# Patient Record
Sex: Male | Born: 1960 | Race: White | Hispanic: No | Marital: Married | Smoking: Former smoker
Health system: Southern US, Community
[De-identification: ages and names within clinical notes are randomized; demographics above are authoritative.]

## PROBLEM LIST (undated history)

## (undated) DIAGNOSIS — M1990 Unspecified osteoarthritis, unspecified site: Secondary | ICD-10-CM

## (undated) DIAGNOSIS — Z87442 Personal history of urinary calculi: Secondary | ICD-10-CM

## (undated) DIAGNOSIS — F419 Anxiety disorder, unspecified: Secondary | ICD-10-CM

## (undated) DIAGNOSIS — J189 Pneumonia, unspecified organism: Secondary | ICD-10-CM

## (undated) DIAGNOSIS — M069 Rheumatoid arthritis, unspecified: Secondary | ICD-10-CM

## (undated) DIAGNOSIS — K219 Gastro-esophageal reflux disease without esophagitis: Secondary | ICD-10-CM

## (undated) HISTORY — PX: JOINT REPLACEMENT: SHX530

## (undated) HISTORY — PX: LEG SURGERY: SHX1003

## (undated) HISTORY — PX: ORIF FINGER / THUMB FRACTURE: SUR932

## (undated) HISTORY — PX: FINGER SURGERY: SHX640

---

## 2007-03-02 HISTORY — PX: KNEE ARTHROSCOPY W/ ACL RECONSTRUCTION: SHX1858

## 2011-12-09 DIAGNOSIS — N400 Enlarged prostate without lower urinary tract symptoms: Secondary | ICD-10-CM | POA: Insufficient documentation

## 2011-12-09 DIAGNOSIS — K219 Gastro-esophageal reflux disease without esophagitis: Secondary | ICD-10-CM | POA: Insufficient documentation

## 2012-03-10 DIAGNOSIS — G4700 Insomnia, unspecified: Secondary | ICD-10-CM | POA: Insufficient documentation

## 2013-03-01 HISTORY — PX: COLONOSCOPY: SHX174

## 2016-12-23 ENCOUNTER — Ambulatory Visit (INDEPENDENT_AMBULATORY_CARE_PROVIDER_SITE_OTHER): Payer: Self-pay | Admitting: Orthopaedic Surgery

## 2016-12-30 ENCOUNTER — Ambulatory Visit (INDEPENDENT_AMBULATORY_CARE_PROVIDER_SITE_OTHER): Payer: 59

## 2016-12-30 ENCOUNTER — Ambulatory Visit (INDEPENDENT_AMBULATORY_CARE_PROVIDER_SITE_OTHER): Payer: 59 | Admitting: Orthopaedic Surgery

## 2016-12-30 DIAGNOSIS — M1711 Unilateral primary osteoarthritis, right knee: Secondary | ICD-10-CM

## 2016-12-30 DIAGNOSIS — M19011 Primary osteoarthritis, right shoulder: Secondary | ICD-10-CM | POA: Diagnosis not present

## 2016-12-30 NOTE — Progress Notes (Signed)
Office Visit Note   Patient: Taylor Heath           Date of Birth: [birth year]           MRN: [id number] Visit Date: 12/30/2016              Requested by: No referring provider defined for this encounter. PCP: [facility]   Assessment & Plan: Visit Diagnoses:  1. Primary osteoarthritis of right shoulder   2. Primary osteoarthritis of right knee     Plan: Patient has advanced right knee degenerative joint disease and right shoulder degenerative joint disease.  Clinically speaking he is doing fine and compensating fine with his right shoulder.  However his right knee is significantly affecting his ADLs and quality of life and he wishes to pursue a total knee replacement.  He understands risk benefits alternatives of surgery including infection, stiffness, incomplete relief of pain, DVTs.  I showed him on a model what the surgery entails.  He had no complications after his left total hip replacement which was done several years ago.  We will get him scheduled for surgery in the near future.    Follow-Up Instructions: Return if symptoms worsen or fail to improve.   Orders:  Orders Placed This Encounter  Procedures  . XR KNEE 3 VIEW RIGHT   No orders of the defined types were placed in this encounter.     Procedures: No procedures performed   Clinical Data: No additional findings.   Subjective: No chief complaint on file.   Patient is a very pleasant 56 year old gentleman who comes to my office for a new problem of right knee pain and right shoulder pain.  He does have a diagnosis of right shoulder osteoarthritis and right knee osteoarthritis.  He has had several cortisone injections in both joints with temporary relief.  His right knee injections have stopped giving him any significant relief.  His right shoulder has had good relief from injections so far.  He is status post right ACL reconstruction sometime ago.  His right shoulder mainly gives him  problems with reaching out and reaching up but otherwise he is doing well.  He is having significant difficulty with ADLs and deterioration of quality of life related to his right knee.  He has significant pain with prolonged standing and activity and especially night pain.  Denies any numbness and tingling.    Review of Systems  Constitutional: Negative.   All other systems reviewed and are negative.    Objective: Vital Signs: There were no vitals taken for this visit.  Physical Exam  Constitutional: He is oriented to person, place, and time. He appears well-developed and well-nourished.  HENT:  Head: Normocephalic and atraumatic.  Eyes: Pupils are equal, round, and reactive to light.  Neck: Neck supple.  Pulmonary/Chest: Effort normal.  Abdominal: Soft.  Musculoskeletal: Normal range of motion.  Neurological: He is alert and oriented to person, place, and time.  Skin: Skin is warm.  Psychiatric: He has a normal mood and affect. His behavior is normal. Judgment and thought content normal.  Nursing note and vitals reviewed.   Ortho Exam Right shoulder exam shows intact rotator cuff.  He does have mild limitation with external rotation and forward flexion.  Right knee exam shows no joint effusion.  Range of motion is 10 degrees to approximately 100 degrees.  Collaterals and cruciates are grossly intact.  Patella tracking is normal.  Positive patellar crepitus. Specialty Comments:  No specialty  comments available.  Imaging: Xr Knee 3 View Right  Result Date: 12/30/2016 End-stage degenerative joint disease right knee    PMFS History: Patient Active Problem List   Diagnosis Date Noted  . Primary osteoarthritis of right shoulder 12/30/2016  . Primary osteoarthritis of right knee 12/30/2016   No past medical history on file.  No family history on file.  No past surgical history on file. Social History   Occupational History  . Not on file.   Social History Main  Topics  . Smoking status: Not on file  . Smokeless tobacco: Not on file  . Alcohol use Not on file  . Drug use: Unknown  . Sexual activity: Not on file

## 2017-01-18 NOTE — Pre-Procedure Instructions (Signed)
Taylor Heath  01/18/2017      [id number] [place], [name] - [address] [place] [address] Phone: [phone] Fax: [phone]    Your procedure is scheduled on November 29  Report to [facility] Admitting at 1015 A.M.  Call this number if you have problems the morning of surgery:  [phone]   Remember:  Do not eat food or drink liquids after midnight.  Continue all medications as directed by your physician except follow these medication instructions before surgery below   Take these medicines the morning of surgery with A SIP OF WATER  omeprazole (PRILOSEC OTC)  7 days prior to surgery STOP taking any Aspirin(unless otherwise instructed by your surgeon), Aleve, Naproxen, Ibuprofen, Motrin, Advil, Goody's, BC's, all herbal medications, fish oil, and all vitamins    Do not wear jewelry  Do not wear lotions, powders, or cologne, or deoderant.  Do not shave 48 hours prior to surgery.  Men may shave face and neck.  Do not bring valuables to the hospital.  [facility] is not responsible for any belongings or valuables.  Contacts, dentures or bridgework may not be worn into surgery.  Leave your suitcase in the car.  After surgery it may be brought to your room.  For patients admitted to the hospital, discharge time will be determined by your treatment team.  Patients discharged the day of surgery will not be allowed to drive home.    Special instructions:   Taylor Heath- Preparing For Surgery  Before surgery, you can play an important role. Because skin is not sterile, your skin needs to be as free of germs as possible. You can reduce the number of germs on your skin by washing with CHG (chlorahexidine gluconate) Soap before surgery.  CHG is an antiseptic cleaner which kills germs and bonds with the skin to continue killing germs even after washing.  Please do not use if you have an allergy to CHG or antibacterial  soaps. If your skin becomes reddened/irritated stop using the CHG.  Do not shave (including legs and underarms) for at least 48 hours prior to first CHG shower. It is OK to shave your face.  Please follow these instructions carefully.   1. Shower the NIGHT BEFORE SURGERY and the MORNING OF SURGERY with CHG.   2. If you chose to wash your hair, wash your hair first as usual with your normal shampoo.  3. After you shampoo, rinse your hair and body thoroughly to remove the shampoo.  4. Use CHG as you would any other liquid soap. You can apply CHG directly to the skin and wash gently with a scrungie or a clean washcloth.   5. Apply the CHG Soap to your body ONLY FROM THE NECK Heath.  Do not use on open wounds or open sores. Avoid contact with your eyes, ears, mouth and genitals (private parts). Wash Face and genitals (private parts)  with your normal soap.  6. Wash thoroughly, paying special attention to the area where your surgery will be performed.  7. Thoroughly rinse your body with warm water from the neck Heath.  8. DO NOT shower/wash with your normal soap after using and rinsing off the CHG Soap.  9. Pat yourself dry with a CLEAN TOWEL.  10. Wear CLEAN PAJAMAS to bed the night before surgery, wear comfortable clothes the morning of surgery  11. Place CLEAN SHEETS on your bed the night of your first shower and  DO NOT SLEEP WITH PETS.    Day of Surgery: Do not apply any deodorants/lotions. Please wear clean clothes to the hospital/surgery center.      Please read over the following fact sheets that you were given.

## 2017-01-19 ENCOUNTER — Other Ambulatory Visit (INDEPENDENT_AMBULATORY_CARE_PROVIDER_SITE_OTHER): Payer: Self-pay | Admitting: Orthopaedic Surgery

## 2017-01-19 ENCOUNTER — Encounter (HOSPITAL_COMMUNITY): Payer: Self-pay

## 2017-01-19 ENCOUNTER — Encounter (HOSPITAL_COMMUNITY)
Admission: RE | Admit: 2017-01-19 | Discharge: 2017-01-19 | Disposition: A | Discharge status: Home / Self Care | Payer: Managed Care, Other (non HMO) | Source: Ambulatory Visit | Attending: Orthopaedic Surgery | Admitting: Orthopaedic Surgery

## 2017-01-19 ENCOUNTER — Other Ambulatory Visit: Payer: Self-pay

## 2017-01-19 DIAGNOSIS — Z01810 Encounter for preprocedural cardiovascular examination: Secondary | ICD-10-CM | POA: Diagnosis present

## 2017-01-19 DIAGNOSIS — M1711 Unilateral primary osteoarthritis, right knee: Secondary | ICD-10-CM

## 2017-01-19 DIAGNOSIS — Z01812 Encounter for preprocedural laboratory examination: Secondary | ICD-10-CM | POA: Insufficient documentation

## 2017-01-19 HISTORY — DX: Unspecified osteoarthritis, unspecified site: M19.90

## 2017-01-19 HISTORY — DX: Personal history of urinary calculi: Z87.442

## 2017-01-19 HISTORY — DX: Anxiety disorder, unspecified: F41.9

## 2017-01-19 LAB — CBC WITH DIFFERENTIAL/PLATELET
BASOS ABS: 0 10*3/uL (ref 0.0–0.1)
BASOS PCT: 0 %
EOS ABS: 0.1 10*3/uL (ref 0.0–0.7)
EOS PCT: 2 %
HCT: 46.5 % (ref 39.0–52.0)
Hemoglobin: 16.2 g/dL (ref 13.0–17.0)
LYMPHS PCT: 28 %
Lymphs Abs: 1.8 10*3/uL (ref 0.7–4.0)
MCH: 31.6 pg (ref 26.0–34.0)
MCHC: 34.8 g/dL (ref 30.0–36.0)
MCV: 90.8 fL (ref 78.0–100.0)
MONO ABS: 0.5 10*3/uL (ref 0.1–1.0)
Monocytes Relative: 7 %
Neutro Abs: 4 10*3/uL (ref 1.7–7.7)
Neutrophils Relative %: 63 %
PLATELETS: 245 10*3/uL (ref 150–400)
RBC: 5.12 MIL/uL (ref 4.22–5.81)
RDW: 13.3 % (ref 11.5–15.5)
WBC: 6.4 10*3/uL (ref 4.0–10.5)

## 2017-01-19 LAB — TYPE AND SCREEN
ABO/RH(D): B POS
ANTIBODY SCREEN: NEGATIVE

## 2017-01-19 LAB — COMPREHENSIVE METABOLIC PANEL
ALT: 48 U/L (ref 17–63)
AST: 35 U/L (ref 15–41)
Albumin: 4.5 g/dL (ref 3.5–5.0)
Alkaline Phosphatase: 65 U/L (ref 38–126)
Anion gap: 9 (ref 5–15)
BUN: 13 mg/dL (ref 6–20)
CO2: 24 mmol/L (ref 22–32)
CREATININE: 0.87 mg/dL (ref 0.61–1.24)
Calcium: 9.8 mg/dL (ref 8.9–10.3)
Chloride: 106 mmol/L (ref 101–111)
GFR calc Af Amer: >60 mL/min (ref >60)
Glucose, Bld: 100 mg/dL — ABNORMAL HIGH (ref 65–99)
POTASSIUM: 4.1 mmol/L (ref 3.5–5.1)
SODIUM: 139 mmol/L (ref 135–145)
TOTAL PROTEIN: 7.4 g/dL (ref 6.5–8.1)
Total Bilirubin: 1.3 mg/dL — ABNORMAL HIGH (ref 0.3–1.2)

## 2017-01-19 LAB — SURGICAL PCR SCREEN
MRSA, PCR: POSITIVE — AB
STAPHYLOCOCCUS AUREUS: POSITIVE — AB

## 2017-01-19 LAB — ABO/RH: ABO/RH(D): B POS

## 2017-01-19 LAB — PROTIME-INR
INR: 0.96
PROTHROMBIN TIME: 12.6 s (ref 11.4–15.2)

## 2017-01-19 LAB — APTT: APTT: 32 s (ref 24–36)

## 2017-01-19 LAB — C-REACTIVE PROTEIN

## 2017-01-19 LAB — SEDIMENTATION RATE: SED RATE: 5 mm/h (ref 0–16)

## 2017-01-19 MED ORDER — CHLORHEXIDINE GLUCONATE 4 % EX LIQD: 60.0000 mL | Freq: Once | CUTANEOUS | Status: DC

## 2017-01-19 NOTE — Progress Notes (Signed)
Needs vanc in addition to the ancef to be started in short stay

## 2017-01-19 NOTE — Progress Notes (Signed)
PCP: [facility]  Cardiologist: pt denies   EKG: pt denies past year  Stress test: pt denies ever  ECHO: pt denies ever  Cardiac Cath: pt denies ever  Chest x-ray: pt denies past year

## 2017-01-19 NOTE — Progress Notes (Signed)
Mup[facility]cin [facility] for positive PCR of MRSA and Staph. Pt notified and voiced understanding.

## 2017-01-24 ENCOUNTER — Other Ambulatory Visit (INDEPENDENT_AMBULATORY_CARE_PROVIDER_SITE_OTHER): Payer: Self-pay | Admitting: Orthopaedic Surgery

## 2017-01-24 DIAGNOSIS — M1711 Unilateral primary osteoarthritis, right knee: Secondary | ICD-10-CM

## 2017-01-25 ENCOUNTER — Other Ambulatory Visit (INDEPENDENT_AMBULATORY_CARE_PROVIDER_SITE_OTHER): Payer: Self-pay

## 2017-01-25 MED ORDER — VANCOMYCIN HCL IN DEXTROSE 1-5 GM/200ML-% IV SOLN: 1000.0000 mg | INTRAVENOUS | Status: DC

## 2017-01-26 MED ORDER — CEFAZOLIN SODIUM-DEXTROSE 2-4 GM/100ML-% IV SOLN
2.0000 g | INTRAVENOUS | Status: DC
  Filled 2017-01-26: qty 100

## 2017-01-26 MED ORDER — BUPIVACAINE LIPOSOME 1.3 % IJ SUSP
20.0000 mL | INTRAMUSCULAR | Status: AC
  Administered 2017-01-27: 20 mL
  Filled 2017-01-26: qty 20

## 2017-01-26 MED ORDER — SODIUM CHLORIDE 0.9 % IV SOLN
1000.0000 mg | INTRAVENOUS | Status: AC
  Administered 2017-01-27: 1000 mg via INTRAVENOUS
  Filled 2017-01-26: qty 10

## 2017-01-27 ENCOUNTER — Other Ambulatory Visit: Payer: Self-pay

## 2017-01-27 ENCOUNTER — Inpatient Hospital Stay (HOSPITAL_COMMUNITY): Payer: Managed Care, Other (non HMO) | Admitting: Certified Registered Nurse Anesthetist

## 2017-01-27 ENCOUNTER — Encounter (INDEPENDENT_AMBULATORY_CARE_PROVIDER_SITE_OTHER): Payer: Self-pay

## 2017-01-27 ENCOUNTER — Encounter (HOSPITAL_COMMUNITY)
Admission: RE | Disposition: A | Discharge status: Home Health | Payer: Self-pay | Source: Ambulatory Visit | Attending: Orthopaedic Surgery

## 2017-01-27 ENCOUNTER — Inpatient Hospital Stay (HOSPITAL_COMMUNITY): Payer: Managed Care, Other (non HMO)

## 2017-01-27 ENCOUNTER — Inpatient Hospital Stay (HOSPITAL_COMMUNITY)
Admission: RE | Admit: 2017-01-27 | Discharge: 2017-01-28 | DRG: 470 | Disposition: A | Discharge status: Home Health | Payer: Managed Care, Other (non HMO) | Source: Ambulatory Visit | Attending: Orthopaedic Surgery | Admitting: Orthopaedic Surgery

## 2017-01-27 ENCOUNTER — Encounter (HOSPITAL_COMMUNITY): Payer: Self-pay | Admitting: Certified Registered Nurse Anesthetist

## 2017-01-27 DIAGNOSIS — Z96642 Presence of left artificial hip joint: Secondary | ICD-10-CM | POA: Diagnosis present

## 2017-01-27 DIAGNOSIS — Z79899 Other long term (current) drug therapy: Secondary | ICD-10-CM

## 2017-01-27 DIAGNOSIS — M1711 Unilateral primary osteoarthritis, right knee: Principal | ICD-10-CM | POA: Diagnosis present

## 2017-01-27 DIAGNOSIS — Z87891 Personal history of nicotine dependence: Secondary | ICD-10-CM | POA: Diagnosis not present

## 2017-01-27 DIAGNOSIS — D62 Acute posthemorrhagic anemia: Secondary | ICD-10-CM | POA: Diagnosis not present

## 2017-01-27 DIAGNOSIS — M1990 Unspecified osteoarthritis, unspecified site: Secondary | ICD-10-CM | POA: Diagnosis present

## 2017-01-27 DIAGNOSIS — Z96659 Presence of unspecified artificial knee joint: Secondary | ICD-10-CM

## 2017-01-27 DIAGNOSIS — F419 Anxiety disorder, unspecified: Secondary | ICD-10-CM | POA: Diagnosis present

## 2017-01-27 DIAGNOSIS — Z888 Allergy status to other drugs, medicaments and biological substances status: Secondary | ICD-10-CM

## 2017-01-27 HISTORY — PX: TOTAL KNEE ARTHROPLASTY: SHX125

## 2017-01-27 LAB — URINALYSIS, ROUTINE W REFLEX MICROSCOPIC
BACTERIA UA: NONE SEEN
Bilirubin Urine: NEGATIVE
GLUCOSE, UA: NEGATIVE mg/dL
Ketones, ur: NEGATIVE mg/dL
LEUKOCYTES UA: NEGATIVE
Nitrite: NEGATIVE
PROTEIN: NEGATIVE mg/dL
SQUAMOUS EPITHELIAL / LPF: NONE SEEN
Specific Gravity, Urine: 1.008 (ref 1.005–1.030)
pH: 6 (ref 5.0–8.0)

## 2017-01-27 SURGERY — ARTHROPLASTY, KNEE, TOTAL
Anesthesia: Monitor Anesthesia Care | Site: Knee | Laterality: Right

## 2017-01-27 MED ORDER — OXYCODONE HCL ER 10 MG PO T12A: 10.0000 mg | EXTENDED_RELEASE_TABLET | Freq: Two times a day (BID) | ORAL | 0 refills | Status: DC

## 2017-01-27 MED ORDER — PROPOFOL 10 MG/ML IV BOLUS
INTRAVENOUS | Status: DC | PRN
  Administered 2017-01-27: 20 mg via INTRAVENOUS

## 2017-01-27 MED ORDER — VANCOMYCIN HCL IN DEXTROSE 1-5 GM/200ML-% IV SOLN
INTRAVENOUS | Status: AC
  Administered 2017-01-27: 1 g
  Filled 2017-01-27: qty 200

## 2017-01-27 MED ORDER — OXYCODONE HCL 5 MG/5ML PO SOLN: 5.0000 mg | Freq: Once | ORAL | Status: DC | PRN

## 2017-01-27 MED ORDER — ONDANSETRON HCL 4 MG PO TABS: 4.0000 mg | ORAL_TABLET | Freq: Three times a day (TID) | ORAL | 0 refills | Status: DC | PRN

## 2017-01-27 MED ORDER — PROMETHAZINE HCL 25 MG PO TABS: 25.0000 mg | ORAL_TABLET | Freq: Four times a day (QID) | ORAL | 1 refills | Status: DC | PRN

## 2017-01-27 MED ORDER — MIDAZOLAM HCL 2 MG/2ML IJ SOLN
INTRAMUSCULAR | Status: AC
  Administered 2017-01-27: 2 mg
  Filled 2017-01-27: qty 2

## 2017-01-27 MED ORDER — FENTANYL CITRATE (PF) 100 MCG/2ML IJ SOLN
INTRAMUSCULAR | Status: DC | PRN
  Administered 2017-01-27: 50 ug via INTRAVENOUS

## 2017-01-27 MED ORDER — OMEPRAZOLE MAGNESIUM 20 MG PO TBEC: 20.0000 mg | DELAYED_RELEASE_TABLET | Freq: Every day | ORAL | Status: DC

## 2017-01-27 MED ORDER — OXYCODONE HCL 5 MG PO TABS
5.0000 mg | ORAL_TABLET | ORAL | Status: DC | PRN
  Administered 2017-01-27: 5 mg via ORAL
  Filled 2017-01-27: qty 1

## 2017-01-27 MED ORDER — SENNOSIDES-DOCUSATE SODIUM 8.6-50 MG PO TABS: 1.0000 | ORAL_TABLET | Freq: Every evening | ORAL | 1 refills | Status: DC | PRN

## 2017-01-27 MED ORDER — DEXAMETHASONE SODIUM PHOSPHATE 10 MG/ML IJ SOLN
10.0000 mg | Freq: Once | INTRAMUSCULAR | Status: AC
  Administered 2017-01-28: 10 mg via INTRAVENOUS
  Filled 2017-01-27: qty 1

## 2017-01-27 MED ORDER — BUPIVACAINE IN DEXTROSE 0.75-8.25 % IT SOLN
INTRATHECAL | Status: DC | PRN
  Administered 2017-01-27: 2 mL via INTRATHECAL

## 2017-01-27 MED ORDER — METHOCARBAMOL 500 MG PO TABS
500.0000 mg | ORAL_TABLET | Freq: Four times a day (QID) | ORAL | Status: DC | PRN
  Administered 2017-01-28: 500 mg via ORAL
  Filled 2017-01-27: qty 1

## 2017-01-27 MED ORDER — FENTANYL CITRATE (PF) 100 MCG/2ML IJ SOLN: 100.0000 ug | Freq: Once | INTRAMUSCULAR | Status: DC

## 2017-01-27 MED ORDER — VANCOMYCIN HCL 1000 MG IV SOLR
INTRAVENOUS | Status: DC | PRN
  Administered 2017-01-27: 1000 mg via TOPICAL

## 2017-01-27 MED ORDER — MIDAZOLAM HCL 2 MG/2ML IJ SOLN
INTRAMUSCULAR | Status: AC
  Filled 2017-01-27: qty 2

## 2017-01-27 MED ORDER — FENTANYL CITRATE (PF) 250 MCG/5ML IJ SOLN
INTRAMUSCULAR | Status: AC
  Filled 2017-01-27: qty 5

## 2017-01-27 MED ORDER — DEXAMETHASONE SODIUM PHOSPHATE 10 MG/ML IJ SOLN
INTRAMUSCULAR | Status: DC | PRN
  Administered 2017-01-27: 10 mg via INTRAVENOUS

## 2017-01-27 MED ORDER — SODIUM CHLORIDE 0.9 % IR SOLN
Status: DC | PRN
  Administered 2017-01-27: 3000 mL

## 2017-01-27 MED ORDER — PHENYLEPHRINE HCL 10 MG/ML IJ SOLN
INTRAVENOUS | Status: DC | PRN
  Administered 2017-01-27: 35 ug/min via INTRAVENOUS

## 2017-01-27 MED ORDER — MIDAZOLAM HCL 5 MG/5ML IJ SOLN
INTRAMUSCULAR | Status: DC | PRN
Start: 2017-01-27 — End: 2017-01-27
  Administered 2017-01-27: 2 mg via INTRAVENOUS

## 2017-01-27 MED ORDER — DIPHENHYDRAMINE HCL 12.5 MG/5ML PO ELIX: 25.0000 mg | ORAL_SOLUTION | ORAL | Status: DC | PRN

## 2017-01-27 MED ORDER — VANCOMYCIN HCL 1000 MG IV SOLR
INTRAVENOUS | Status: AC
  Filled 2017-01-27: qty 1000

## 2017-01-27 MED ORDER — TIZANIDINE HCL 4 MG PO TABS: 4.0000 mg | ORAL_TABLET | Freq: Four times a day (QID) | ORAL | 2 refills | Status: DC | PRN

## 2017-01-27 MED ORDER — OXYCODONE-ACETAMINOPHEN 5-325 MG PO TABS: 1.0000 | ORAL_TABLET | ORAL | 0 refills | Status: DC | PRN

## 2017-01-27 MED ORDER — ASPIRIN EC 325 MG PO TBEC
325.0000 mg | DELAYED_RELEASE_TABLET | Freq: Two times a day (BID) | ORAL | Status: DC
  Administered 2017-01-27 – 2017-01-28 (×3): 325 mg via ORAL
  Filled 2017-01-27 (×3): qty 1

## 2017-01-27 MED ORDER — ONDANSETRON HCL 4 MG PO TABS: 4.0000 mg | ORAL_TABLET | Freq: Four times a day (QID) | ORAL | Status: DC | PRN

## 2017-01-27 MED ORDER — MENTHOL 3 MG MT LOZG: 1.0000 | LOZENGE | OROMUCOSAL | Status: DC | PRN

## 2017-01-27 MED ORDER — HYDROMORPHONE HCL 1 MG/ML IJ SOLN: 0.2500 mg | INTRAMUSCULAR | Status: DC | PRN

## 2017-01-27 MED ORDER — SODIUM CHLORIDE 0.9 % IJ SOLN
INTRAMUSCULAR | Status: DC | PRN
  Administered 2017-01-27: 40 mL via INTRAVENOUS

## 2017-01-27 MED ORDER — SODIUM CHLORIDE 0.9 % IV SOLN
1000.0000 mg | Freq: Once | INTRAVENOUS | Status: AC
  Administered 2017-01-27: 1000 mg via INTRAVENOUS
  Filled 2017-01-27: qty 10

## 2017-01-27 MED ORDER — OXYCODONE HCL ER 15 MG PO T12A
15.0000 mg | EXTENDED_RELEASE_TABLET | Freq: Two times a day (BID) | ORAL | Status: DC
  Administered 2017-01-27 – 2017-01-28 (×2): 15 mg via ORAL
  Filled 2017-01-27 (×2): qty 1

## 2017-01-27 MED ORDER — PROPOFOL 500 MG/50ML IV EMUL
INTRAVENOUS | Status: DC | PRN
  Administered 2017-01-27: 100 ug/kg/min via INTRAVENOUS

## 2017-01-27 MED ORDER — SODIUM CHLORIDE 0.9 % IV SOLN
INTRAVENOUS | Status: DC
  Administered 2017-01-27 – 2017-01-28 (×2): via INTRAVENOUS

## 2017-01-27 MED ORDER — KETOROLAC TROMETHAMINE 15 MG/ML IJ SOLN
30.0000 mg | Freq: Four times a day (QID) | INTRAMUSCULAR | Status: AC
  Administered 2017-01-27 – 2017-01-28 (×4): 30 mg via INTRAVENOUS
  Filled 2017-01-27 (×4): qty 2

## 2017-01-27 MED ORDER — 0.9 % SODIUM CHLORIDE (POUR BTL) OPTIME
TOPICAL | Status: DC | PRN
  Administered 2017-01-27: 1000 mL

## 2017-01-27 MED ORDER — TRANEXAMIC ACID 1000 MG/10ML IV SOLN
2000.0000 mg | INTRAVENOUS | Status: AC
  Administered 2017-01-27: .01 mg via TOPICAL
  Filled 2017-01-27: qty 20

## 2017-01-27 MED ORDER — MAGNESIUM CITRATE PO SOLN: 1.0000 | Freq: Once | ORAL | Status: DC | PRN

## 2017-01-27 MED ORDER — VANCOMYCIN HCL IN DEXTROSE 1-5 GM/200ML-% IV SOLN
1000.0000 mg | Freq: Two times a day (BID) | INTRAVENOUS | Status: AC
  Administered 2017-01-27 – 2017-01-28 (×2): 1000 mg via INTRAVENOUS
  Filled 2017-01-27 (×2): qty 200

## 2017-01-27 MED ORDER — ACETAMINOPHEN 500 MG PO TABS
1000.0000 mg | ORAL_TABLET | Freq: Four times a day (QID) | ORAL | Status: AC
  Administered 2017-01-27 – 2017-01-28 (×4): 1000 mg via ORAL
  Filled 2017-01-27 (×4): qty 2

## 2017-01-27 MED ORDER — PHENOL 1.4 % MT LIQD: 1.0000 | OROMUCOSAL | Status: DC | PRN

## 2017-01-27 MED ORDER — ONDANSETRON HCL 4 MG/2ML IJ SOLN
INTRAMUSCULAR | Status: DC | PRN
Start: 2017-01-27 — End: 2017-01-27
  Administered 2017-01-27: 4 mg via INTRAVENOUS

## 2017-01-27 MED ORDER — FENTANYL CITRATE (PF) 100 MCG/2ML IJ SOLN
INTRAMUSCULAR | Status: AC
Start: 2017-01-27 — End: 2017-01-27
  Administered 2017-01-27: 100 ug
  Filled 2017-01-27: qty 2

## 2017-01-27 MED ORDER — ACETAMINOPHEN 325 MG PO TABS: 650.0000 mg | ORAL_TABLET | ORAL | Status: DC | PRN

## 2017-01-27 MED ORDER — OXYCODONE HCL 5 MG PO TABS
10.0000 mg | ORAL_TABLET | ORAL | Status: DC | PRN
  Administered 2017-01-28 (×2): 10 mg via ORAL
  Filled 2017-01-27 (×2): qty 2

## 2017-01-27 MED ORDER — VANCOMYCIN HCL IN DEXTROSE 1-5 GM/200ML-% IV SOLN
1000.0000 mg | Freq: Once | INTRAVENOUS | Status: AC
  Administered 2017-01-27: 1000 mg via INTRAVENOUS

## 2017-01-27 MED ORDER — SORBITOL 70 % SOLN: 30.0000 mL | Freq: Every day | Status: DC | PRN

## 2017-01-27 MED ORDER — POLYETHYLENE GLYCOL 3350 17 G PO PACK: 17.0000 g | PACK | Freq: Every day | ORAL | Status: DC | PRN

## 2017-01-27 MED ORDER — METOCLOPRAMIDE HCL 5 MG/ML IJ SOLN: 5.0000 mg | Freq: Three times a day (TID) | INTRAMUSCULAR | Status: DC | PRN

## 2017-01-27 MED ORDER — METHOCARBAMOL 1000 MG/10ML IJ SOLN: 500.0000 mg | Freq: Four times a day (QID) | INTRAVENOUS | Status: DC | PRN

## 2017-01-27 MED ORDER — MIDAZOLAM HCL 2 MG/2ML IJ SOLN: 2.0000 mg | Freq: Once | INTRAMUSCULAR | Status: DC

## 2017-01-27 MED ORDER — PRAVASTATIN SODIUM 40 MG PO TABS
40.0000 mg | ORAL_TABLET | Freq: Every day | ORAL | Status: DC
  Administered 2017-01-27 – 2017-01-28 (×2): 40 mg via ORAL
  Filled 2017-01-27 (×2): qty 1

## 2017-01-27 MED ORDER — PHENYLEPHRINE 40 MCG/ML (10ML) SYRINGE FOR IV PUSH (FOR BLOOD PRESSURE SUPPORT)
PREFILLED_SYRINGE | INTRAVENOUS | Status: AC
  Filled 2017-01-27: qty 10

## 2017-01-27 MED ORDER — ASPIRIN EC 325 MG PO TBEC: 325.0000 mg | DELAYED_RELEASE_TABLET | Freq: Two times a day (BID) | ORAL | 0 refills | Status: DC

## 2017-01-27 MED ORDER — METOCLOPRAMIDE HCL 5 MG PO TABS: 5.0000 mg | ORAL_TABLET | Freq: Three times a day (TID) | ORAL | Status: DC | PRN

## 2017-01-27 MED ORDER — PANTOPRAZOLE SODIUM 40 MG PO TBEC
40.0000 mg | DELAYED_RELEASE_TABLET | Freq: Every day | ORAL | Status: DC
  Administered 2017-01-28: 40 mg via ORAL
  Filled 2017-01-27: qty 1

## 2017-01-27 MED ORDER — MORPHINE SULFATE (PF) 4 MG/ML IV SOLN: 1.0000 mg | INTRAVENOUS | Status: DC | PRN

## 2017-01-27 MED ORDER — OXYCODONE HCL 5 MG PO TABS: 5.0000 mg | ORAL_TABLET | Freq: Once | ORAL | Status: DC | PRN

## 2017-01-27 MED ORDER — PROPOFOL 10 MG/ML IV BOLUS
INTRAVENOUS | Status: AC
  Filled 2017-01-27: qty 20

## 2017-01-27 MED ORDER — LACTATED RINGERS IV SOLN
INTRAVENOUS | Status: DC
  Administered 2017-01-27 (×3): via INTRAVENOUS

## 2017-01-27 MED ORDER — VALACYCLOVIR HCL 500 MG PO TABS
500.0000 mg | ORAL_TABLET | Freq: Every day | ORAL | Status: DC
  Administered 2017-01-27 – 2017-01-28 (×2): 500 mg via ORAL
  Filled 2017-01-27 (×2): qty 1

## 2017-01-27 MED ORDER — ONDANSETRON HCL 4 MG/2ML IJ SOLN
INTRAMUSCULAR | Status: AC
  Filled 2017-01-27: qty 2

## 2017-01-27 MED ORDER — DIPHENHYDRAMINE HCL 50 MG/ML IJ SOLN
INTRAMUSCULAR | Status: DC | PRN
  Administered 2017-01-27: 25 mg via INTRAVENOUS

## 2017-01-27 MED ORDER — ACETAMINOPHEN 650 MG RE SUPP: 650.0000 mg | RECTAL | Status: DC | PRN

## 2017-01-27 MED ORDER — ALUM & MAG HYDROXIDE-SIMETH 200-200-20 MG/5ML PO SUSP: 30.0000 mL | ORAL | Status: DC | PRN

## 2017-01-27 MED ORDER — ONDANSETRON HCL 4 MG/2ML IJ SOLN: 4.0000 mg | Freq: Four times a day (QID) | INTRAMUSCULAR | Status: DC | PRN

## 2017-01-27 MED ORDER — ROPIVACAINE HCL 7.5 MG/ML IJ SOLN
INTRAMUSCULAR | Status: DC | PRN
Start: 2017-01-27 — End: 2017-01-27
  Administered 2017-01-27: 20 mL via PERINEURAL

## 2017-01-27 SURGICAL SUPPLY — 61 items
ALCOHOL ISOPROPYL (RUBBING) (MISCELLANEOUS) ×3 IMPLANT
BAG DECANTER FOR FLEXI CONT (MISCELLANEOUS) ×3 IMPLANT
BANDAGE ACE 6X5 VEL STRL LF (GAUZE/BANDAGES/DRESSINGS) ×6 IMPLANT
BANDAGE ESMARK 6X9 LF (GAUZE/BANDAGES/DRESSINGS) ×1 IMPLANT
BENZOIN TINCTURE PRP APPL 2/3 (GAUZE/BANDAGES/DRESSINGS) ×3 IMPLANT
BLADE SAW SGTL 13.0X1.19X90.0M (BLADE) ×3 IMPLANT
BNDG ESMARK 6X9 LF (GAUZE/BANDAGES/DRESSINGS) ×3
BOWL SMART MIX CTS (DISPOSABLE) ×3 IMPLANT
CAPT KNEE TRIATH TK-4 ×3 IMPLANT
CEMENT BONE REFOBACIN R1X40 US (Cement) ×3 IMPLANT
CLOSURE STERI-STRIP 1/2X4 (GAUZE/BANDAGES/DRESSINGS) ×2
CLSR STERI-STRIP ANTIMIC 1/2X4 (GAUZE/BANDAGES/DRESSINGS) ×4 IMPLANT
COVER SURGICAL LIGHT HANDLE (MISCELLANEOUS) ×3 IMPLANT
CUFF TOURNIQUET SINGLE 34IN LL (TOURNIQUET CUFF) ×3 IMPLANT
DRAPE EXTREMITY T 121X128X90 (DRAPE) ×3 IMPLANT
DRAPE HALF SHEET 40X57 (DRAPES) ×3 IMPLANT
DRAPE ORTHO SPLIT 77X108 STRL (DRAPES) ×4
DRAPE SURG 17X11 SM STRL (DRAPES) ×6 IMPLANT
DRAPE SURG ORHT 6 SPLT 77X108 (DRAPES) ×2 IMPLANT
DRSG AQUACEL AG ADV 3.5X10 (GAUZE/BANDAGES/DRESSINGS) ×3 IMPLANT
DRSG AQUACEL AG ADV 3.5X14 (GAUZE/BANDAGES/DRESSINGS) ×3 IMPLANT
DURAPREP 26ML APPLICATOR (WOUND CARE) ×6 IMPLANT
ELECT CAUTERY BLADE 6.4 (BLADE) ×3 IMPLANT
ELECT REM PT RETURN 9FT ADLT (ELECTROSURGICAL) ×3
ELECTRODE REM PT RTRN 9FT ADLT (ELECTROSURGICAL) ×1 IMPLANT
GLOVE SKINSENSE NS SZ7.5 (GLOVE) ×2
GLOVE SKINSENSE STRL SZ7.5 (GLOVE) ×1 IMPLANT
GLOVE SURG SYN 7.5  E (GLOVE) ×8
GLOVE SURG SYN 7.5 E (GLOVE) ×4 IMPLANT
GOWN STRL REIN XL XLG (GOWN DISPOSABLE) ×3 IMPLANT
GOWN STRL REUS W/ TWL LRG LVL3 (GOWN DISPOSABLE) ×1 IMPLANT
GOWN STRL REUS W/TWL LRG LVL3 (GOWN DISPOSABLE) ×2
HANDPIECE INTERPULSE COAX TIP (DISPOSABLE) ×2
HOOD PEEL AWAY FACE SHEILD DIS (HOOD) ×9 IMPLANT
HOOD PEEL AWAY FLYTE STAYCOOL (MISCELLANEOUS) ×6 IMPLANT
KIT BASIN OR (CUSTOM PROCEDURE TRAY) ×3 IMPLANT
KIT ROOM TURNOVER OR (KITS) ×3 IMPLANT
MANIFOLD NEPTUNE II (INSTRUMENTS) ×3 IMPLANT
MARKER SKIN DUAL TIP RULER LAB (MISCELLANEOUS) ×3 IMPLANT
NEEDLE SPNL 18GX3.5 QUINCKE PK (NEEDLE) ×3 IMPLANT
NS IRRIG 1000ML POUR BTL (IV SOLUTION) ×3 IMPLANT
PACK TOTAL JOINT (CUSTOM PROCEDURE TRAY) ×3 IMPLANT
PAD ARMBOARD 7.5X6 YLW CONV (MISCELLANEOUS) ×6 IMPLANT
SAW OSC TIP CART 19.5X105X1.3 (SAW) ×3 IMPLANT
SET HNDPC FAN SPRY TIP SCT (DISPOSABLE) ×1 IMPLANT
STAPLER VISISTAT 35W (STAPLE) IMPLANT
SUCTION FRAZIER HANDLE 10FR (MISCELLANEOUS) ×2
SUCTION TUBE FRAZIER 10FR DISP (MISCELLANEOUS) ×1 IMPLANT
SUT ETHILON 2 0 FS 18 (SUTURE) IMPLANT
SUT MNCRL AB 4-0 PS2 18 (SUTURE) IMPLANT
SUT VIC AB 0 CT1 27 (SUTURE) ×4
SUT VIC AB 0 CT1 27XBRD ANBCTR (SUTURE) ×2 IMPLANT
SUT VIC AB 1 CTX 27 (SUTURE) ×9 IMPLANT
SUT VIC AB 2-0 CT1 27 (SUTURE) ×6
SUT VIC AB 2-0 CT1 TAPERPNT 27 (SUTURE) ×3 IMPLANT
SYR 50ML LL SCALE MARK (SYRINGE) ×3 IMPLANT
TOWEL OR 17X24 6PK STRL BLUE (TOWEL DISPOSABLE) ×3 IMPLANT
TOWEL OR 17X26 10 PK STRL BLUE (TOWEL DISPOSABLE) ×3 IMPLANT
TRAY URETHRAL FOLEY CATH 14FR (CATHETERS) ×3 IMPLANT
UNDERPAD 30X30 (UNDERPADS AND DIAPERS) ×3 IMPLANT
WRAP KNEE MAXI GEL POST OP (GAUZE/BANDAGES/DRESSINGS) ×3 IMPLANT

## 2017-01-27 NOTE — Anesthesia Preprocedure Evaluation (Signed)
Anesthesia Evaluation  Patient identified by MRN, date of birth, ID band Patient awake    Reviewed: Allergy & Precautions, NPO status , Patient's Chart, lab work & pertinent test results  History of Anesthesia Complications Negative for: history of anesthetic complications  Airway Mallampati: II  TM Distance: >3 FB Neck ROM: Full    Dental   Pulmonary former smoker,    breath sounds clear to auscultation       Cardiovascular negative cardio ROS   Rhythm:Regular Rate:Normal     Neuro/Psych negative neurological ROS     GI/Hepatic negative GI ROS, Neg liver ROS,   Endo/Other    Renal/GU negative Renal ROS     Musculoskeletal  (+) Arthritis ,   Abdominal   Peds  Hematology negative hematology ROS (+)   Anesthesia Other Findings   Reproductive/Obstetrics                             Anesthesia Physical Anesthesia Plan  ASA: II  Anesthesia Plan: Spinal   Post-op Pain Management:  Regional for Post-op pain   Induction:   PONV Risk Score and Plan: 2 and Treatment may vary due to age or medical condition  Airway Management Planned: Natural Airway and Simple Face Mask  Additional Equipment:   Intra-op Plan:   Post-operative Plan:   Informed Consent:   Plan Discussed with: CRNA  Anesthesia Plan Comments:         Anesthesia Quick Evaluation

## 2017-01-27 NOTE — H&P (Signed)
    PREOPERATIVE H&P  Chief Complaint: right knee degenerative joint disease  HPI: Taylor Heath is a 56 y.o. male who presents for surgical treatment of right knee degenerative joint disease.  He denies any changes in medical history.  Past Medical History:  Diagnosis Date  . Anxiety   . Arthritis   . History of kidney stones    Past Surgical History:  Procedure Laterality Date  . JOINT REPLACEMENT     left hip replaced  . KNEE ARTHROSCOPY W/ ACL RECONSTRUCTION Right 2009  . ORIF FINGER / THUMB FRACTURE     Social History   Socioeconomic History  . Marital status: Married    Spouse name: Not on file  . Number of children: Not on file  . Years of education: Not on file  . Highest education level: Not on file  Social Needs  . Financial resource strain: Not on file  . Food insecurity - worry: Not on file  . Food insecurity - inability: Not on file  . Transportation needs - medical: Not on file  . Transportation needs - non-medical: Not on file  Occupational History  . Not on file  Tobacco Use  . Smoking status: Former Smoker    Types: Cigarettes    Last attempt to quit: 04/21/2010    Years since quitting: 6.7  . Smokeless tobacco: Never Used  Substance and Sexual Activity  . Alcohol use: No    Frequency: Never  . Drug use: No  . Sexual activity: Not on file  Other Topics Concern  . Not on file  Social History Narrative  . Not on file   No family history on file. Allergies  Allergen Reactions  . Meperidine     UNSPECIFIED REACTION    Prior to Admission medications   Medication Sig Start Date End Date Taking? Authorizing Provider  ibuprofen (ADVIL,MOTRIN) 200 MG tablet Take 400-600 mg every 6 (six) hours as needed by mouth for headache or moderate pain.   Yes [provider]  Multiple Vitamins-Minerals (MULTIVITAMIN PO) Take 1 tablet daily by mouth.   Yes [provider]  Omega-3 Fatty Acids (FISH OIL PO) Take 1 capsule daily by mouth.    Yes [provider]  omeprazole (PRILOSEC OTC) 20 MG tablet Take 20 mg daily by mouth.   Yes [provider]  pravastatin (PRAVACHOL) 40 MG tablet Take 40 mg daily by mouth. 11/01/16  Yes [provider]  valACYclovir (VALTREX) 500 MG tablet Take 500 mg daily by mouth. 11/03/16  Yes [provider]     Positive ROS: All other systems have been reviewed and were otherwise negative with the exception of those mentioned in the HPI and as above.  Physical Exam: General: Alert, no acute distress Cardiovascular: No pedal edema Respiratory: No cyanosis, no use of accessory musculature GI: abdomen soft Skin: No lesions in the area of chief complaint Neurologic: Sensation intact distally Psychiatric: Patient is competent for consent with normal mood and affect Lymphatic: no lymphedema  MUSCULOSKELETAL: exam stable  Assessment: right knee degenerative joint disease  Plan: Plan for Procedure(s): RIGHT TOTAL KNEE ARTHROPLASTY  The risks benefits and alternatives were discussed with the patient including but not limited to the risks of nonoperative treatment, versus surgical intervention including infection, bleeding, nerve injury,  blood clots, cardiopulmonary complications, morbidity, mortality, among others, and they were willing to proceed.   Glee Arvin, MD   01/27/2017 8:02 AM

## 2017-01-27 NOTE — Anesthesia Procedure Notes (Addendum)
Anesthesia Regional Block: Adductor canal block   Pre-Anesthetic Checklist: ,, timeout performed, Correct Patient, Correct Site, Correct Laterality, Correct Procedure, Correct Position, site marked, Risks and benefits discussed,  Surgical consent,  Pre-op evaluation,  At surgeon's request and post-op pain management  Laterality: Right and Lower  Prep: chloraprep       Needles:   Needle Type: Echogenic Stimulator Needle     Needle Length: 9cm  Needle Gauge: 21   Needle insertion depth: 5 cm   Additional Needles:   Procedures:,,,, ultrasound used (permanent image in chart),,,,  Narrative:  Start time: 01/27/2017 12:00 PM End time: 01/27/2017 12:15 PM Injection made incrementally with aspirations every 5 mL.  Performed by: Personally  Anesthesiologist: Sharee Holster, MD

## 2017-01-27 NOTE — Progress Notes (Signed)
  Pt admitted to the unit. Pt is stable, alert and oriented per baseline. Oriented to room, staff, and call bell. Educated to call for any assistance. Bed in lowest position, call bell within reach- will continue to monitor. 

## 2017-01-27 NOTE — Progress Notes (Signed)
Orthopedic Tech Progress Note Patient Details:  Taylor Heath [birth year] [id number]  CPM Right Knee CPM Right Knee: On Right Knee Flexion (Degrees): 90 Right Knee Extension (Degrees): 0  Ortho Devices Ortho Device/Splint Location: footsie roll Ortho Device/Splint Interventions: Ordered, Application   Coltrane, [place] 01/27/2017, 3:33 PM

## 2017-01-27 NOTE — Evaluation (Signed)
Physical Therapy Evaluation Patient Details Name: Taylor Heath MRN: [id number] DOB: 1960/03/24 Today's Date: 01/27/2017   History of Present Illness  56 y.o. male s/p R TKA 11/29. PMH includes: anxiety  Clinical Impression  Patient is s/p above surgery resulting in functional limitations due to the deficits listed below (see PT Problem List). PTA, patient was independent with all mobility. Patient lives in 2 story home with wife. Upon eval patient is supervision level with bed mobility and min guard with transfers and gait. Introduced therex and gait training to patient and will continue to progress towards goals tomorrow.  Patient will benefit from skilled PT to increase their independence and safety with mobility to allow discharge to the venue listed below.       Follow Up Recommendations Home health PT;DC plan and follow up therapy as arranged by surgeon;Supervision for mobility/OOB    Equipment Recommendations  Rolling walker with 5" wheels    Recommendations for Other Services       Precautions / Restrictions Precautions Precautions: Knee;Fall Precaution Booklet Issued: Yes (comment) Precaution Comments: reviewed no pillow under knee, use of zero foam.  Restrictions Weight Bearing Restrictions: Yes Other Position/Activity Restrictions: WBAT      Mobility  Bed Mobility Overal bed mobility: Modified Independent                Transfers Overall transfer level: Needs assistance Equipment used: Rolling walker (2 wheeled) Transfers: Sit to/from Stand Sit to Stand: Min guard         General transfer comment: Min guard to rise into RW. cues for hand placement LOBx1 first trail requiring min a to balance back. mulitple attempts without LOB after.   Ambulation/Gait Ambulation/Gait assistance: Min guard Ambulation Distance (Feet): 60 Feet Assistive device: Rolling walker (2 wheeled) Gait Pattern/deviations: Step-through pattern Gait velocity: decreased    General Gait Details: step throug gait with min guard at this time cues for safety with RW  Stairs            Wheelchair Mobility    Modified Rankin (Stroke Patients Only)       Balance Overall balance assessment: Needs assistance Sitting-balance support: Feet unsupported;No upper extremity supported Sitting balance-Leahy Scale: Normal     Standing balance support: During functional activity;Bilateral upper extremity supported Standing balance-Leahy Scale: Fair                               Pertinent Vitals/Pain Pain Assessment: No/denies pain Pain Location: nerve block     Home Living Family/patient expects to be discharged to:: Private residence Living Arrangements: Spouse/significant other Available Help at Discharge: Family Type of Home: House Home Access: Stairs to enter Entrance Stairs-Rails: Can reach both Entrance Stairs-Number of Steps: 1 Home Layout: Two level Home Equipment: Bedside commode;Crutches      Prior Function Level of Independence: Independent               Hand Dominance        Extremity/Trunk Assessment   Upper Extremity Assessment Upper Extremity Assessment: Overall WFL for tasks assessed    Lower Extremity Assessment Lower Extremity Assessment: Overall WFL for tasks assessed;RLE deficits/detail RLE Deficits / Details: Hip Flexion 3/5       Communication   Communication: No difficulties  Cognition Arousal/Alertness: Awake/alert Behavior During Therapy: WFL for tasks assessed/performed  General Comments General comments (skin integrity, edema, etc.): VSS throughout session.    Exercises Total Joint Exercises Ankle Circles/Pumps: AAROM;Both;10 reps Quad Sets: AROM;Both;10 reps Short Arc Quad: AROM;Right;10 reps Hip ABduction/ADduction: AROM;Both;10 reps Straight Leg Raises: AROM;Both;10 reps Long Arc Quad: AROM;Both;10 reps Knee Flexion:  AROM;Right;10 reps Goniometric ROM: 95   Assessment/Plan    PT Assessment Patient needs continued PT services  PT Problem List Decreased strength;Decreased range of motion;Decreased activity tolerance;Decreased balance;Decreased mobility;Decreased knowledge of use of DME;Decreased safety awareness;Pain       PT Treatment Interventions DME instruction;Gait training;Stair training;Functional mobility training;Therapeutic exercise;Therapeutic activities;Balance training    PT Goals (Current goals can be found in the Care Plan section)  Acute Rehab PT Goals Patient Stated Goal: to return home and be abel to go up full flight at home PT Goal Formulation: With patient/family Time For Goal Achievement: 02/03/17 Potential to Achieve Goals: Good    Frequency 7X/week   Barriers to discharge        Co-evaluation               AM-PAC PT "6 Clicks" Daily Activity  Outcome Measure Difficulty turning over in bed (including adjusting bedclothes, sheets and blankets)?: A Little Difficulty moving from lying on back to sitting on the side of the bed? : A Little Difficulty sitting down on and standing up from a chair with arms (e.g., wheelchair, bedside commode, etc,.)?: A Little Help needed moving to and from a bed to chair (including a wheelchair)?: A Little Help needed walking in hospital room?: A Little Help needed climbing 3-5 steps with a railing? : A Little 6 Click Score: 18    End of Session Equipment Utilized During Treatment: Gait belt Activity Tolerance: Patient tolerated treatment well Patient left: in bed;with bed alarm set;with family/visitor present Nurse Communication: Mobility status PT Visit Diagnosis: Unsteadiness on feet (R26.81);Other abnormalities of gait and mobility (R26.89);Pain;Muscle weakness (generalized) (M62.81) Pain - Right/Left: Right Pain - part of body: Knee    Time: 1640-1720 PT Time Calculation (min) (ACUTE ONLY): 40 min   Charges:   PT  Evaluation $PT Eval Low Complexity: 1 Low PT Treatments $Gait Training: 8-22 mins $Therapeutic Exercise: 8-22 mins   PT G Codes:        Etta Grandchild, PT, DPT Acute Rehab Services Pager: [phone]    Etta Grandchild 01/27/2017, 5:26 PM

## 2017-01-27 NOTE — Anesthesia Procedure Notes (Signed)
Spinal  Patient location during procedure: OR Start time: 01/27/2017 12:40 PM End time: 01/27/2017 12:45 PM Staffing Anesthesiologist: Sharee Holster, MD Preanesthetic Checklist Completed: patient identified, site marked, surgical consent, pre-op evaluation, timeout performed, IV checked, risks and benefits discussed and monitors and equipment checked Spinal Block Patient position: sitting Prep: ChloraPrep and site prepped and draped Patient monitoring: heart rate, cardiac monitor, continuous pulse ox and blood pressure Approach: midline Location: L3-4 Injection technique: single-shot Needle Needle type: Pencan  Needle gauge: 24 G Needle length: 9 cm Needle insertion depth: 4 cm Assessment Sensory level: T6

## 2017-01-27 NOTE — Transfer of Care (Signed)
Immediate Anesthesia Transfer of Care Note  Patient: Taylor Heath  Procedure(s) Performed: RIGHT TOTAL KNEE ARTHROPLASTY (Right Knee)  Patient Location: PACU  Anesthesia Type:Spinal and MAC combined with regional for post-op pain  Level of Consciousness: awake, alert  and oriented  Airway & Oxygen Therapy: Patient Spontanous Breathing and Patient connected to nasal cannula oxygen  Post-op Assessment: Report given to RN and Post -op Vital signs reviewed and stable  Post vital signs: Reviewed and stable  Last Vitals:  Vitals:   01/27/17 1220 01/27/17 1225  BP: (!) 137/97 132/86  Pulse: 71 74  Resp: 16 12  Temp:    SpO2: 96% 98%    Last Pain:  Vitals:   01/27/17 1059  TempSrc:   PainSc: 3       Patients Stated Pain Goal: 4 (59/97/74 1423)  Complications: No apparent anesthesia complications

## 2017-01-27 NOTE — Op Note (Signed)
Total Knee Arthroplasty Procedure Note  Preoperative diagnosis: Right knee osteoarthritis  Postoperative diagnosis:same  Operative procedure: Right total knee arthroplasty. CPT [phone]  Surgeon: N. Glee Arvin, MD  Assistants: Hart Carwin, RNFA  Anesthesia: Spinal, regional  Tourniquet time: 75 mins  Implants used: [employer] Triatholon Femur: PS 4, uncemented Tibia: Universal tibia 5, cemented Patella: 35 mm, 10 thick, uncemented Polyethylene: 13 mm  Indication: Taylor Heath is a 56 y.o. year old male with a history of knee pain. Having failed conservative management, the patient elected to proceed with a total knee arthroplasty.  We have reviewed the risk and benefits of the surgery and they elected to proceed after voicing understanding.  Procedure:  After informed consent was obtained and understanding of the risk were voiced including but not limited to bleeding, infection, damage to surrounding structures including nerves and vessels, blood clots, leg length inequality and the failure to achieve desired results, the operative extremity was marked with verbal confirmation of the patient in the holding area.   The patient was then brought to the operating room and transported to the operating room table in the supine position.  A tourniquet was applied to the operative extremity around the upper thigh. The operative limb was then prepped and draped in the usual sterile fashion and preoperative antibiotics were administered.  A time out was performed prior to the start of surgery confirming the correct extremity, preoperative antibiotic administration, as well as team members, implants and instruments available for the case. Correct surgical site was also confirmed with preoperative radiographs. The limb was then elevated for exsanguination and the tourniquet was inflated. A midline incision was made and a standard medial parapatellar approach was performed.  The patella was  prepared and sized to a 35 mm.  A cover was placed on the patella for protection from retractors.  We then turned our attention to the femur. Posterior cruciate ligament was sacrificed. Start site was drilled in the femur and the intramedullary distal femoral cutting guide was placed, set at 5 degrees valgus, taking 11 mm of distal resection. The distal cut was made. Osteophytes were then removed. Next, the proximal tibial cutting guide was placed with appropriate slope, varus/valgus alignment and depth of resection. The proximal tibial cut was made. Gap blocks were then used to assess the extension gap and alignment, and appropriate soft tissue releases were performed. Attention was turned back to the femur, which was sized using the sizing guide to a size 4. Appropriate rotation of the femoral component was determined using epicondylar axis, Whiteside's line, and assessing the flexion gap under ligament tension. The appropriate size 4-in-1 cutting block was placed and cuts were made. Posterior femoral osteophytes and uncapped bone were then removed with the curved osteotome. The tibia was sized for a size 5 component. The femoral box-cutting guide was placed and prepared for a PS femoral component. Trial components were placed, and stability was checked in full extension, mid-flexion, and deep flexion. Proper tibial rotation was determined and marked.  The patella tracked well without a lateral release. Trial components were then removed and tibial preparation performed. A posterior capsular injection comprising of 20 cc of 1.3% exparel and 40 cc of normal saline was performed for postoperative pain control. The bony surfaces were irrigated with a pulse lavage and then dried. The final components sized above were malleted into place. The stability of the construct was re-evaluated throughout a range of motion and found to be acceptable. The trial liner was  removed, the knee was copiously irrigated, and the  knee was re-evaluated for any excess bone debris. The real polyethylene liner, 13 mm thick, was inserted and checked to ensure the locking mechanism had engaged appropriately. The tourniquet was deflated and hemostasis was achieved. The wound was irrigated with normal saline. A drain was not placed. Capsular closure was performed with a #1 vicryl, subcutaneous fat closed with a 2.0 vicryl suture, then subcutaneous tissue closed with interrupted 2.0 vicryl suture. The skin was then closed with a 3.0 monocryl. A sterile dressing was applied.   The patient was awakened in the operating room and taken to recovery in stable condition. All sponge, needle, and instrument counts were correct at the end of the case.  Position: supine  Complications: none.  Time Out: performed   Drains/Packing: none  Estimated blood loss: minimal  Returned to Recovery Room: in good condition.   Antibiotics: yes   Mechanical VTE (DVT) Prophylaxis: sequential compression devices, TED thigh-high  Chemical VTE (DVT) Prophylaxis: aspirin  Fluid Replacement  Crystalloid: see anesthesia record Blood: none  FFP: none   Specimens Removed: 1 to pathology   Sponge and Instrument Count Correct? yes   PACU: portable radiograph - knee AP and Lateral   Admission: inpatient status  Plan/RTC: Return in 2 weeks for wound check.   Weight Bearing/Load Lower Extremity: full   N. Glee Arvin, MD [facility] [phone] 2:46 PM

## 2017-01-27 NOTE — Discharge Instructions (Signed)

## 2017-01-28 LAB — BASIC METABOLIC PANEL
ANION GAP: 6 (ref 5–15)
BUN: 14 mg/dL (ref 6–20)
CALCIUM: 8.1 mg/dL — AB (ref 8.9–10.3)
CO2: 24 mmol/L (ref 22–32)
CREATININE: 0.85 mg/dL (ref 0.61–1.24)
Chloride: 107 mmol/L (ref 101–111)
GFR calc Af Amer: >60 mL/min (ref >60)
GLUCOSE: 128 mg/dL — AB (ref 65–99)
Potassium: 4.1 mmol/L (ref 3.5–5.1)
Sodium: 137 mmol/L (ref 135–145)

## 2017-01-28 LAB — CBC
HEMATOCRIT: 36.8 % — AB (ref 39.0–52.0)
Hemoglobin: 12.4 g/dL — ABNORMAL LOW (ref 13.0–17.0)
MCH: 30.8 pg (ref 26.0–34.0)
MCHC: 33.7 g/dL (ref 30.0–36.0)
MCV: 91.5 fL (ref 78.0–100.0)
PLATELETS: 248 10*3/uL (ref 150–400)
RBC: 4.02 MIL/uL — ABNORMAL LOW (ref 4.22–5.81)
RDW: 12.9 % (ref 11.5–15.5)
WBC: 8.5 10*3/uL (ref 4.0–10.5)

## 2017-01-28 NOTE — Evaluation (Signed)
Occupational Therapy Evaluation Patient Details Name: Taylor Heath MRN: [id number] DOB: May 14, 1960 Today's Date: 01/28/2017    History of Present Illness 56 y.o. male s/p R TKA 11/29. PMH includes: anxiety   Clinical Impression   Pt with decline in function and safety with ADLs and ADL mobility with decreased balance and endurance. Pt would benefit from acute OT services to address impairments to maximize level of function for safe transition home    Follow Up Recommendations  DC plan and follow up therapy as arranged by surgeon;Home health OT;Supervision - Intermittent    Equipment Recommendations  Tub/shower seat    Recommendations for Other Services       Precautions / Restrictions Precautions Precautions: Knee;Fall Precaution Booklet Issued: Yes (comment) Precaution Comments: reviewed no pillow under knee Restrictions Weight Bearing Restrictions: Yes RLE Weight Bearing: Weight bearing as tolerated Other Position/Activity Restrictions: WBAT      Mobility Bed Mobility Overal bed mobility: Modified Independent                Transfers Overall transfer level: Needs assistance Equipment used: Rolling walker (2 wheeled) Transfers: Sit to/from Stand Sit to Stand: Min guard         General transfer comment: Superivsion to sit to stand, no LOB. safe [place] of RW    Balance Overall balance assessment: Needs assistance Sitting-balance support: Feet unsupported;No upper extremity supported Sitting balance-Leahy Scale: Good     Standing balance support: During functional activity;Bilateral upper extremity supported;Single extremity supported Standing balance-Leahy Scale: Fair                             ADL either performed or assessed with clinical judgement   ADL Overall ADL's : Needs assistance/impaired Eating/Feeding: Independent;Sitting   Grooming: Wash/dry hands;Wash/dry face;Standing;Min guard   Upper Body Bathing: Set up;Sitting    Lower Body Bathing: Minimal assistance   Upper Body Dressing : Set up;Sitting   Lower Body Dressing: Minimal assistance   Toilet Transfer: Min guard;RW;Comfort height toilet;Grab bars;Ambulation   Toileting- Clothing Manipulation and Hygiene: Minimal assistance;Sit to/from stand   Tub/ Shower Transfer: Min guard;3 in [place] walker;Ambulation;Grab bars   Functional mobility during ADLs: Min guard;Rolling walker       Vision Baseline Vision/History: Wears glasses Wears Glasses: Reading only Patient Visual Report: No change from baseline       Perception     Praxis      Pertinent Vitals/Pain Pain Assessment: 0-10 Pain Score: 4  Pain Location: R knee Pain Descriptors / Indicators: Aching;Discomfort;Sore Pain Intervention(s): Monitored during session;Repositioned;Relaxation     Hand Dominance Right   Extremity/Trunk Assessment Upper Extremity Assessment Upper Extremity Assessment: Overall WFL for tasks assessed   Lower Extremity Assessment Lower Extremity Assessment: Defer to PT evaluation   Cervical / Trunk Assessment Cervical / Trunk Assessment: Normal   Communication Communication Communication: No difficulties   Cognition Arousal/Alertness: Awake/alert Behavior During Therapy: WFL for tasks assessed/performed Overall Cognitive Status: Within Functional Limits for tasks assessed                                    General Comments   pt pleasant and cooperative    Exercises     Shoulder Instructions      Home Living Family/patient expects to be discharged to:: Private residence Living Arrangements: Spouse/significant other Available Help at Discharge: Family Type of Home: House  Home Access: Stairs to enter [employer] of Steps: 1 Entrance Stairs-Rails: Can reach both Home Layout: Two level Alternate Level Stairs-Number of Steps: 16 Alternate Level Stairs-Rails: Can reach both Bathroom Shower/Tub: Company secretary: Standard Bathroom Accessibility: Yes   Home Equipment: Bedside commode;Crutches          Prior Functioning/Environment Level of Independence: Independent                 OT Problem List: Decreased activity tolerance;Decreased knowledge of use of DME or AE;Impaired balance (sitting and/or standing);Pain      OT Treatment/Interventions: Self-care/ADL training;DME and/or AE instruction;Therapeutic activities;Patient/family education    OT Goals(Current goals can be found in the care plan section) Acute Rehab OT Goals Patient Stated Goal: to return home  OT Goal Formulation: With patient Time For Goal Achievement: 01/28/17 Potential to Achieve Goals: Good ADL Goals Pt Will Perform Grooming: with supervision;standing;with caregiver independent in assisting Pt Will Perform Lower Body Bathing: with min guard assist;sitting/lateral leans;sit to/from stand;with caregiver independent in assisting Pt Will Perform Lower Body Dressing: with min guard assist;sitting/lateral leans;sit to/from stand;with caregiver independent in assisting Pt Will Transfer to Toilet: with supervision;ambulating;grab bars Pt Will Perform Toileting - Clothing Manipulation and hygiene: with min guard assist;with supervision;sit to/from stand;with caregiver independent in assisting Pt Will Perform Tub/Shower Transfer: with supervision;3 in 1;shower seat;rolling walker;ambulating  OT Frequency: Min 2X/week   Barriers to D/C:    no barriers, wife will be assisting prn per pt       Co-evaluation              AM-PAC PT "6 Clicks" Daily Activity     Outcome Measure Help from another person eating meals?: None Help from another person taking care of personal grooming?: A Little Help from another person toileting, which includes using toliet, bedpan, or urinal?: A Little Help from another person bathing (including washing, rinsing, drying)?: A Little Help from another person to put on  and taking off regular upper body clothing?: None Help from another person to put on and taking off regular lower body clothing?: A Little 6 Click Score: 20   End of Session Equipment Utilized During Treatment: Gait belt;Rolling walker;Other (comment)(3 in 1) CPM Right Knee CPM Right Knee: Off  Activity Tolerance: Patient tolerated treatment well Patient left: in bed;with call bell/phone within reach;with nursing/sitter in room(sitting EOB)  OT Visit Diagnosis: Unsteadiness on feet (R26.81);Other abnormalities of gait and mobility (R26.89);Pain                Time: 6606-3016 OT Time Calculation (min): 27 min Charges:  OT General Charges $OT Visit: 1 Visit OT Evaluation $OT Eval Low Complexity: 1 Low OT Treatments $Therapeutic Activity: 8-22 mins G-Codes: OT G-codes **NOT FOR INPATIENT CLASS** Functional Assessment Tool Used: AM-PAC 6 Clicks Daily Activity     Galen Manila 01/28/2017, 2:10 PM

## 2017-01-28 NOTE — Progress Notes (Signed)
Physical Therapy Treatment Patient Details Name: Taylor Heath MRN: [id number] DOB: 09/14/1960 Today's Date: 01/28/2017    History of Present Illness 56 y.o. male s/p R TKA 11/29. PMH includes: anxiety    PT Comments    Patient progressing extremely well at this time. Session focused on stair training, supervision level with gait and stairs. Cued to decrease pace to maximize safety. Pt eager to d/c home, will see BID this afternoon to  Reinforce therex and reinforce stair training with family present/ demonstrate consistency in preparation for safe return home when medically cleared for d/c.     Follow Up Recommendations  Home health PT;DC plan and follow up therapy as arranged by surgeon;Supervision for mobility/OOB     Equipment Recommendations  Rolling walker with 5" wheels    Recommendations for Other Services       Precautions / Restrictions Precautions Precautions: Knee;Fall Precaution Booklet Issued: Yes (comment) Precaution Comments: reviewed no pillow under knee, use of zero foam.  Restrictions Weight Bearing Restrictions: Yes Other Position/Activity Restrictions: WBAT    Mobility  Bed Mobility Overal bed mobility: Modified Independent                Transfers Overall transfer level: Needs assistance Equipment used: Rolling walker (2 wheeled) Transfers: Sit to/from Stand Sit to Stand: Min guard;Supervision         General transfer comment: Superivsion to sit to stand, no LOB. safe [place] of RW  Ambulation/Gait Ambulation/Gait assistance: Supervision Ambulation Distance (Feet): 200 Feet Assistive device: Rolling walker (2 wheeled) Gait Pattern/deviations: Step-through pattern Gait velocity: increased   General Gait Details: step through gait, extremly fast velocity cues for slowing down to maximize safety, patient resistent to alter his pace.    Stairs Stairs: Yes   Stair Management: Two rails Number of Stairs: 12 General stair comments:  cues for sequencing, patient supervision-light contact guard level with BUE support. Quickly ambulates, cues for slowing down.  Wheelchair Mobility    Modified Rankin (Stroke Patients Only)       Balance Overall balance assessment: Needs assistance Sitting-balance support: Feet unsupported;No upper extremity supported Sitting balance-Leahy Scale: Normal     Standing balance support: During functional activity;Bilateral upper extremity supported Standing balance-Leahy Scale: Fair                              Cognition Arousal/Alertness: Awake/alert Behavior During Therapy: WFL for tasks assessed/performed Overall Cognitive Status: Within Functional Limits for tasks assessed                                 General Comments: Impulsive with motion, very fast.       Exercises      General Comments        Pertinent Vitals/Pain Pain Assessment: 0-10 Pain Score: 4  Pain Descriptors / Indicators: Aching;Operative site guarding;Discomfort Pain Intervention(s): Limited activity within patient's tolerance;Monitored during session;Repositioned    Home Living                      Prior Function            PT Goals (current goals can now be found in the care plan section) Acute Rehab PT Goals Patient Stated Goal: to return home and be abel to go up full flight at home PT Goal Formulation: With patient/family Time For Goal Achievement: 02/03/17 Potential  to Achieve Goals: Good Progress towards PT goals: Progressing toward goals    Frequency    7X/week      PT Plan Current plan remains appropriate    Co-evaluation              AM-PAC PT "6 Clicks" Daily Activity  Outcome Measure  Difficulty turning over in bed (including adjusting bedclothes, sheets and blankets)?: A Little Difficulty moving from lying on back to sitting on the side of the bed? : A Little Difficulty sitting down on and standing up from a chair with arms  (e.g., wheelchair, bedside commode, etc,.)?: A Little Help needed moving to and from a bed to chair (including a wheelchair)?: A Little Help needed walking in hospital room?: A Little Help needed climbing 3-5 steps with a railing? : A Little 6 Click Score: 18    End of Session Equipment Utilized During Treatment: Gait belt Activity Tolerance: Patient tolerated treatment well Patient left: in bed;with bed alarm set;with family/visitor present Nurse Communication: Mobility status PT Visit Diagnosis: Unsteadiness on feet (R26.81);Other abnormalities of gait and mobility (R26.89);Pain;Muscle weakness (generalized) (M62.81) Pain - Right/Left: Right Pain - part of body: Knee     Time: 8280-0349 PT Time Calculation (min) (ACUTE ONLY): 20 min  Charges:  $Gait Training: 8-22 mins                    G Codes:      Etta Grandchild, PT, DPT Acute Rehab Services Pager: [phone]   Etta Grandchild 01/28/2017, 11:29 AM

## 2017-01-28 NOTE — Anesthesia Postprocedure Evaluation (Addendum)
Anesthesia Post Note  Patient: Taylor Heath  Procedure(s) Performed: RIGHT TOTAL KNEE ARTHROPLASTY (Right Knee)     Patient location during evaluation: PACU Anesthesia Type: Spinal Level of consciousness: awake and alert Pain management: pain level controlled Vital Signs Assessment: post-procedure vital signs reviewed and stable Respiratory status: spontaneous breathing, nonlabored ventilation, respiratory function stable and patient connected to nasal cannula oxygen Cardiovascular status: stable and blood pressure returned to baseline Postop Assessment: no apparent nausea or vomiting and spinal receding Anesthetic complications: no    Last Vitals:  Vitals:   01/27/17 2314 01/28/17 0428  BP: 112/67 112/67  Pulse: 83 (!) 59  Resp: 16 16  Temp: 37.1 C 36.8 C  SpO2: 94% 95%    Last Pain:  Vitals:   01/28/17 0428  TempSrc: Oral  PainSc:                  [name]

## 2017-01-28 NOTE — Plan of Care (Signed)
  Coping: Level of anxiety will decrease 01/28/2017 1322 - Progressing by Darrow Bussing, RN   Pain Managment: General experience of comfort will improve 01/28/2017 1322 - Progressing by Darrow Bussing, RN   Safety: Ability to remain free from injury will improve 01/28/2017 1322 - Progressing by Darrow Bussing, RN

## 2017-01-28 NOTE — Progress Notes (Signed)
Physical Therapy Treatment and Discharge Patient Details Name: Taylor Heath MRN: [id number] DOB: October 05, 1960 Today's Date: 01/28/2017    History of Present Illness 56 y.o. male s/p R TKA 11/29. PMH includes: anxiety    PT Comments    PM session progressing activity tolerance and stair training. Pt currently mod I with all mobility and supervision with stairs. Pt ambulating 500 feet with RW, able to take short steps in room without walker but encouraged to use RW for now to maximize safety. Pt and family educated on safety considerations for home and have no further questions or concerns at this time. Pt has met all functional goals and will benefit from skilled home health PT when medically cleared for d/c.      Follow Up Recommendations  Home health PT;DC plan and follow up therapy as arranged by surgeon;Supervision for mobility/OOB     Equipment Recommendations  Rolling walker with 5" wheels    Recommendations for Other Services       Precautions / Restrictions Precautions Precautions: Knee;Fall Precaution Comments: reviewed no pillow under knee Restrictions Weight Bearing Restrictions: Yes RLE Weight Bearing: Weight bearing as tolerated    Mobility  Bed Mobility Overal bed mobility: Modified Independent                Transfers Overall transfer level: Modified independent Equipment used: Rolling walker (2 wheeled) Transfers: Sit to/from Stand Sit to Stand: Min guard            Ambulation/Gait Ambulation/Gait assistance: Modified independent (Device/Increase time) Ambulation Distance (Feet): 550 Feet Assistive device: Rolling walker (2 wheeled) Gait Pattern/deviations: Step-through pattern         Stairs Stairs: Yes   Stair Management: Two rails Number of Stairs: 16 General stair comments: Pt able to perform stairs safely with supervision at this time.   Wheelchair Mobility    Modified Rankin (Stroke Patients Only)       Balance  Overall balance assessment: Needs assistance Sitting-balance support: Feet unsupported;No upper extremity supported Sitting balance-Leahy Scale: Good     Standing balance support: During functional activity;Bilateral upper extremity supported;Single extremity supported Standing balance-Leahy Scale: Fair                              Cognition Arousal/Alertness: Awake/alert Behavior During Therapy: WFL for tasks assessed/performed Overall Cognitive Status: Within Functional Limits for tasks assessed                                 General Comments: Impulsive with motion, very fast.       Exercises Total Joint Exercises Straight Leg Raises: AROM;Both;10 reps Long Arc Quad: AROM;Both;10 reps Knee Flexion: AROM;Right;10 reps    General Comments General comments (skin integrity, edema, etc.): VSS throughout session      Pertinent Vitals/Pain Pain Assessment: Faces Pain Score: 4  Faces Pain Scale: Hurts little more Pain Location: R knee Pain Descriptors / Indicators: Aching;Discomfort;Sore Pain Intervention(s): Limited activity within patient's tolerance;Monitored during session;Premedicated before session    [place] expects to be discharged to:: Private residence Living Arrangements: Spouse/significant other Available Help at Discharge: Family Type of Home: House Home Access: Stairs to enter Entrance Stairs-Rails: Can reach both Home Layout: Two level Home Equipment: Bedside commode;Crutches      Prior Function Level of Independence: Independent          PT Goals (  current goals can now be found in the care plan section) Acute Rehab PT Goals Patient Stated Goal: to return home  PT Goal Formulation: With patient/family Time For Goal Achievement: 02/03/17 Potential to Achieve Goals: Good Progress towards PT goals: Goals met/education completed, patient discharged from PT    Frequency           PT Plan Current plan  remains appropriate    Co-evaluation              AM-PAC PT "6 Clicks" Daily Activity  Outcome Measure  Difficulty turning over in bed (including adjusting bedclothes, sheets and blankets)?: A Little Difficulty moving from lying on back to sitting on the side of the bed? : A Little Difficulty sitting down on and standing up from a chair with arms (e.g., wheelchair, bedside commode, etc,.)?: A Little Help needed moving to and from a bed to chair (including a wheelchair)?: A Little Help needed walking in hospital room?: A Little Help needed climbing 3-5 steps with a railing? : A Little 6 Click Score: 18    End of Session Equipment Utilized During Treatment: Gait belt Activity Tolerance: Patient tolerated treatment well Patient left: in bed;with bed alarm set;with family/visitor present Nurse Communication: Mobility status PT Visit Diagnosis: Unsteadiness on feet (R26.81);Other abnormalities of gait and mobility (R26.89);Pain;Muscle weakness (generalized) (M62.81) Pain - Right/Left: Right Pain - part of body: Knee     Time: 1430-1455 PT Time Calculation (min) (ACUTE ONLY): 25 min  Charges:  $Gait Training: 8-22 mins                    G Codes:       Reinaldo Berber, PT, DPT Acute Rehab Services Pager: [phone]     Reinaldo Berber 01/28/2017, 3:00 PM

## 2017-01-28 NOTE — Progress Notes (Signed)
   Subjective:  Patient reports pain as mild.  Doing well.    Objective:   VITALS:   Vitals:   01/27/17 1600 01/27/17 1611 01/27/17 2314 01/28/17 0428  BP: 120/80  112/67 112/67  Pulse: (!) 59 (!) 53 83 (!) 59  Resp: 18 16 16 16   Temp:  (!) 97.3 F (36.3 C) 98.8 F (37.1 C) 98.2 F (36.8 C)  TempSrc:   Oral Oral  SpO2: 93% 94% 94% 95%  Weight:        Neurologically intact Neurovascular intact Sensation intact distally Intact pulses distally Dorsiflexion/Plantar flexion intact Incision: dressing C/D/I and no drainage No cellulitis present Compartment soft   Lab Results  Component Value Date   WBC 8.5 01/28/2017   HGB 12.4 (L) 01/28/2017   HCT 36.8 (L) 01/28/2017   MCV 91.5 01/28/2017   PLT 248 01/28/2017     Assessment/Plan:  1 Day Post-Op   - Expected postop acute blood loss anemia - will monitor for symptoms - Up with PT/OT - DVT ppx - SCDs, ambulation, aspirin - WBAT operative extremity - Pain control - Discharge planning  01/30/2017 01/28/2017, 8:01 AM [phone]

## 2017-01-28 NOTE — Progress Notes (Signed)
Removed IV, provided discharge education/instrcutions, all questions and concerns addressed, Pt not in distress, discharged home accompanied by family members.

## 2017-01-28 NOTE — Discharge Summary (Signed)
Physician Discharge Summary      Patient ID: Taylor Heath MRN: [id number] DOB/AGE: Nov 12, 1960 56 y.o.  Admit date: 01/27/2017 Discharge date: 01/28/2017  Admission Diagnoses:  <principal problem not specified>  Discharge Diagnoses:  Active Problems:   Total knee replacement status   Past Medical History:  Diagnosis Date  . Anxiety   . Arthritis   . History of kidney stones     Surgeries: Procedure(s): RIGHT TOTAL KNEE ARTHROPLASTY on 01/27/2017   Consultants (if any):   Discharged Condition: Improved  Hospital Course: Taylor Heath is an 56 y.o. male who was admitted 01/27/2017 with a diagnosis of <principal problem not specified> and went to the operating room on 01/27/2017 and underwent the above named procedures.    He was given perioperative antibiotics:  Anti-infectives (From admission, onward)   Start     Dose/Rate Route Frequency Ordered Stop   01/28/17 0000  vancomycin (VANCOCIN) IVPB 1000 mg/200 mL premix     1,000 mg 200 mL/hr over 60 Minutes Intravenous Every 12 hours 01/27/17 1623 01/28/17 1255   01/27/17 1700  valACYclovir (VALTREX) tablet 500 mg     500 mg Oral Daily 01/27/17 1623     01/27/17 1430  vancomycin (VANCOCIN) powder  Status:  Discontinued       As needed 01/27/17 1430 01/27/17 1510   01/27/17 1230  vancomycin (VANCOCIN) IVPB 1000 mg/200 mL premix     1,000 mg 200 mL/hr over 60 Minutes Intravenous  Once 01/27/17 1222 01/27/17 1315   01/27/17 1215  vancomycin (VANCOCIN) 1-5 GM/200ML-% IVPB    Comments:  Forte, Lindsi   : cabinet override      01/27/17 1215 01/27/17 1218   01/27/17 1200  ceFAZolin (ANCEF) IVPB 2g/100 mL premix  Status:  Discontinued     2 g 200 mL/hr over 30 Minutes Intravenous To [facility]Stay [facility] 01/26/17 1220 01/27/17 1615    .  He was given sequential compression devices, early ambulation, and aspirin for DVT prophylaxis.  He benefited maximally from the hospital stay and there were no complications.     Recent vital signs:  Vitals:   01/27/17 2314 01/28/17 0428  BP: 112/67 112/67  Pulse: 83 (!) 59  Resp: 16 16  Temp: 98.8 F (37.1 C) 98.2 F (36.8 C)  SpO2: 94% 95%    Recent laboratory studies:  Lab Results  Component Value Date   HGB 12.4 (L) 01/28/2017   HGB 16.2 01/19/2017   Lab Results  Component Value Date   WBC 8.5 01/28/2017   PLT 248 01/28/2017   Lab Results  Component Value Date   INR 0.96 01/19/2017   Lab Results  Component Value Date   NA 137 01/28/2017   K 4.1 01/28/2017   CL 107 01/28/2017   CO2 24 01/28/2017   BUN 14 01/28/2017   CREATININE 0.85 01/28/2017   GLUCOSE 128 (H) 01/28/2017    Discharge Medications:   Allergies as of 01/28/2017      Reactions   Meperidine    UNSPECIFIED REACTION       Medication List    TAKE these medications   aspirin EC 325 MG tablet Take 1 tablet (325 mg total) by mouth 2 (two) times daily.   FISH OIL PO Take 1 capsule daily by mouth.   ibuprofen 200 MG tablet Commonly known as:  ADVIL,MOTRIN Take 400-600 mg every 6 (six) hours as needed by mouth for headache or moderate pain.   MULTIVITAMIN PO Take 1 tablet daily  by mouth.   omeprazole 20 MG tablet Commonly known as:  PRILOSEC OTC Take 20 mg daily by mouth.   ondansetron 4 MG tablet Commonly known as:  ZOFRAN Take 1-2 tablets (4-8 mg total) by mouth every 8 (eight) hours as needed for nausea or vomiting.   oxyCODONE 10 mg 12 hr tablet Commonly known as:  OXYCONTIN Take 1 tablet (10 mg total) by mouth every 12 (twelve) hours.   oxyCODONE-acetaminophen 5-325 MG tablet Commonly known as:  PERCOCET Take 1-2 tablets by mouth every 4 (four) hours as needed for severe pain.   pravastatin 40 MG tablet Commonly known as:  PRAVACHOL Take 40 mg daily by mouth.   promethazine 25 MG tablet Commonly known as:  PHENERGAN Take 1 tablet (25 mg total) by mouth every 6 (six) hours as needed for nausea.   senna-docusate 8.6-50 MG tablet Commonly known  as:  SENOKOT S Take 1 tablet by mouth at bedtime as needed.   tiZANidine 4 MG tablet Commonly known as:  ZANAFLEX Take 1 tablet (4 mg total) by mouth every 6 (six) hours as needed for muscle spasms.   valACYclovir 500 MG tablet Commonly known as:  VALTREX Take 500 mg daily by mouth.            Durable Medical Equipment  (From admission, onward)        Start     Ordered   01/27/17 1624  DME Walker rolling  Once    Question:  Patient needs a walker to treat with the following condition  Answer:  Total knee replacement status   01/27/17 1623   01/27/17 1624  DME 3 n 1  Once     01/27/17 1623   01/27/17 1624  DME Bedside commode  Once    Question:  Patient needs a bedside commode to treat with the following condition  Answer:  Total knee replacement status   01/27/17 1623      Diagnostic Studies: Dg Knee Right Port  Result Date: 01/27/2017 CLINICAL DATA:  Initial evaluation status post total knee replacement. EXAM: PORTABLE RIGHT KNEE - 1-2 VIEW COMPARISON:  None. FINDINGS: Postoperative changes from recent right total knee arthroplasty placement. Cemented femoral and tibial components appear well seated. Small amount of curvilinear lucency noted at the osseous cement interface at the medial tibia. No fracture or other complication. Postoperative soft tissue swelling with emphysema. IMPRESSION: Postoperative changes from right total knee arthroplasty without complication. Electronically Signed   By: Rise Mu M.D.   On: 01/27/2017 18:47   Xr Knee 3 View Right  Result Date: 12/30/2016 End-stage degenerative joint disease right knee   Disposition: Final discharge disposition not confirmed  Discharge Instructions    Call MD / Call 911   Complete by:  As directed    If you experience chest pain or shortness of breath, CALL 911 and be transported to the hospital emergency room.  If you develope a fever above 101.5 F, pus (white drainage) or increased drainage or  redness at the wound, or calf pain, call your surgeon's office.   Constipation Prevention   Complete by:  As directed    Drink plenty of fluids.  Prune juice may be helpful.  You may use a stool softener, such as Colace (over the counter) 100 mg twice a day.  Use MiraLax (over the counter) for constipation as needed.   Driving restrictions   Complete by:  As directed    No driving while taking narcotic pain meds.  Increase activity slowly as tolerated   Complete by:  As directed       Follow-up Information    Tarry Kos, MD Follow up in 2 week(s).   Specialty:  Orthopedic Surgery Why:  For suture removal, For wound re-check Contact information: [address] [place] [address] [phone]        [facility] up.   Specialty:  Home Health Services Why:  Home Health Physical Therapy- agency will call to arrange initial visit Contact information: [address] [place] [address] [phone]            Signed: Glee Arvin 01/28/2017, 3:26 PM

## 2017-01-28 NOTE — Care Management Note (Signed)
Case Management Note  Patient Details  Name: Taylor Heath MRN: [id number] Date of Birth: [birth year]  Subjective/Objective:   S/p Right TKA                  Action/Plan: Discharge Planning: NCM spoke to pt and offered choice for HH/list provided. Pt agreeable to [facility] for HHPT. Will need HHPT orders. Message to attending. Pt requested RW for home. Contacted [facility] for RW for home to be delivered to room prior to dc. Wife at home to assist with care.   PCP [facility]    Expected Discharge Date:  01/30/17               Expected Discharge Plan:  Home w Home Health Services  In-House Referral:  NA  Discharge planning Services  CM Consult  Post Acute Care Choice:  Home Health Choice offered to:  Patient  DME Arranged:  Walker rolling DME Agency:  [facility]  HH Arranged:  PT HH Agency:  [facility]  Status of Service:  Completed, signed off  If discussed at [employer] of Stay Meetings, dates discussed:    Additional Comments:  Elliot Cousin, RN 01/28/2017, 12:35 PM

## 2017-01-29 ENCOUNTER — Encounter (HOSPITAL_COMMUNITY): Payer: Self-pay | Admitting: Orthopaedic Surgery

## 2017-01-31 ENCOUNTER — Telehealth (INDEPENDENT_AMBULATORY_CARE_PROVIDER_SITE_OTHER): Payer: Self-pay | Admitting: Orthopaedic Surgery

## 2017-01-31 NOTE — Telephone Encounter (Signed)
IC wife back, and she says that he did not come home with any compression hose.  What length do you want him to have?  Calf or thigh? Wife wants him to have it.

## 2017-01-31 NOTE — Telephone Encounter (Signed)
IC her and advised call [place], and arrange to get these from them.  No Rx should be needed, she will call if any further is needed.

## 2017-01-31 NOTE — Telephone Encounter (Signed)
Yes he should during the day

## 2017-01-31 NOTE — Telephone Encounter (Signed)
calf

## 2017-01-31 NOTE — Telephone Encounter (Signed)
Patient wife Almira Coaster) called asked if patient need to wear a compression stocking? The number to contact patient is [phone]

## 2017-01-31 NOTE — Telephone Encounter (Signed)
See below, please advise, Right TKA on 01/27/17.

## 2017-02-02 ENCOUNTER — Telehealth (INDEPENDENT_AMBULATORY_CARE_PROVIDER_SITE_OTHER): Payer: Self-pay | Admitting: Orthopaedic Surgery

## 2017-02-02 ENCOUNTER — Telehealth (INDEPENDENT_AMBULATORY_CARE_PROVIDER_SITE_OTHER): Payer: Self-pay | Admitting: Radiology

## 2017-02-02 NOTE — Telephone Encounter (Signed)
See message.

## 2017-02-02 NOTE — Telephone Encounter (Signed)
yes

## 2017-02-02 NOTE — Telephone Encounter (Signed)
Called Vincenza Hews to advise on verbal orders.

## 2017-02-02 NOTE — Telephone Encounter (Signed)
Wife Almira Coaster is asking if patient can have a CPM machine?  He did well with the machine in the hospital and wants to have one at home.  HHPT came yesterday and comes again Friday. Please call her to advise on CPM.

## 2017-02-02 NOTE — Telephone Encounter (Signed)
Please advise. See message below.  

## 2017-02-02 NOTE — Telephone Encounter (Signed)
Almira Coaster called back and I told her what note said, she says he feels that he is not able to move the knee as well now.  HHPT is supposed to come out tomorrow, I have asked her to have the therapist call us with a report on how he is doing/what degree he can flex to.  Then maybe Dr Roda Shutters can see if his ROM is ok or not at this point.  I told Almira Coaster someone would call them back to advise once we see what therapist says tomorrow.

## 2017-02-02 NOTE — Telephone Encounter (Signed)
Taylor Heath -(PT) with [facility] called needing verbal orders for HHPT. The number to contact Vincenza Hews is [phone]

## 2017-02-02 NOTE — Telephone Encounter (Signed)
Is this okay?

## 2017-02-02 NOTE — Telephone Encounter (Signed)
He should not need it.  Usually I only use it in the hospital for the first couple of days.  As long as his ROM is progressing with HHPT.

## 2017-02-02 NOTE — Telephone Encounter (Signed)
Called no answer LMOM to return call. Need to advise on message below.

## 2017-02-03 ENCOUNTER — Other Ambulatory Visit (INDEPENDENT_AMBULATORY_CARE_PROVIDER_SITE_OTHER): Payer: Self-pay

## 2017-02-03 ENCOUNTER — Telehealth (INDEPENDENT_AMBULATORY_CARE_PROVIDER_SITE_OTHER): Payer: Self-pay | Admitting: Orthopaedic Surgery

## 2017-02-03 MED ORDER — OXYCODONE-ACETAMINOPHEN 5-325 MG PO TABS: 1.0000 | ORAL_TABLET | Freq: Four times a day (QID) | ORAL | 0 refills | Status: DC | PRN

## 2017-02-03 NOTE — Telephone Encounter (Signed)
yes

## 2017-02-03 NOTE — Telephone Encounter (Signed)
Rx pending signature. Will be ready for pick up tomorrow AM.  

## 2017-02-03 NOTE — Telephone Encounter (Signed)
30

## 2017-02-03 NOTE — Telephone Encounter (Signed)
Do you need range of motion for patient? Wife told Physical Therapist that we needed that. Marta Antu # is [phone]

## 2017-02-03 NOTE — Telephone Encounter (Signed)
See message.

## 2017-02-03 NOTE — Telephone Encounter (Signed)
Patient called requesting an RX refill on his Percocet.

## 2017-02-03 NOTE — Telephone Encounter (Signed)
Please advise 

## 2017-02-04 NOTE — Telephone Encounter (Signed)
  Bending 70 degrees.  ROM can do on his own most of the time.   Ready to go back to work. Twice a week for 3 weeks. Sterri-strips still intact. Has  A scheduled appt next week

## 2017-02-10 ENCOUNTER — Ambulatory Visit (INDEPENDENT_AMBULATORY_CARE_PROVIDER_SITE_OTHER): Payer: 59 | Admitting: Orthopaedic Surgery

## 2017-02-10 ENCOUNTER — Encounter (INDEPENDENT_AMBULATORY_CARE_PROVIDER_SITE_OTHER): Payer: Self-pay | Admitting: Orthopaedic Surgery

## 2017-02-10 DIAGNOSIS — M1711 Unilateral primary osteoarthritis, right knee: Secondary | ICD-10-CM

## 2017-02-10 MED ORDER — OXYCODONE-ACETAMINOPHEN 5-325 MG PO TABS: 1.0000 | ORAL_TABLET | Freq: Four times a day (QID) | ORAL | 0 refills | Status: DC | PRN

## 2017-02-10 NOTE — Progress Notes (Signed)
Patient is two-week status post right knee replacement.  He is doing well overall.  He is requesting a referral refill of his Percocet.  He would like to return to work on Monday.  He is ambulating without any assistive devices.  His incision is healed without signs of infection.  Patient is doing very well.  Referral to outpatient physical therapy.  Continue aspirin for DVT prophylaxis.  Questions encouraged and answered.  Follow-up in 4 weeks with three-view x-rays of the right knee.

## 2017-02-16 ENCOUNTER — Other Ambulatory Visit (INDEPENDENT_AMBULATORY_CARE_PROVIDER_SITE_OTHER): Payer: Self-pay | Admitting: Orthopaedic Surgery

## 2017-02-17 ENCOUNTER — Other Ambulatory Visit (INDEPENDENT_AMBULATORY_CARE_PROVIDER_SITE_OTHER): Payer: Self-pay | Admitting: Family

## 2017-02-17 ENCOUNTER — Telehealth (INDEPENDENT_AMBULATORY_CARE_PROVIDER_SITE_OTHER): Payer: Self-pay | Admitting: Orthopaedic Surgery

## 2017-02-17 MED ORDER — OXYCODONE-ACETAMINOPHEN 5-325 MG PO TABS: 1.0000 | ORAL_TABLET | Freq: Four times a day (QID) | ORAL | 0 refills | Status: DC | PRN

## 2017-02-17 NOTE — Telephone Encounter (Signed)
Called Patient rx ready for pick up at the front desk'.

## 2017-02-17 NOTE — Telephone Encounter (Signed)
Could you advise on this since Dr Roda Shutters is out of the office this PM.?

## 2017-02-17 NOTE — Telephone Encounter (Signed)
Patient called asking for a refill on Percocet. He has a few pills left but will be heading out of town tomorrow for the holidays and wont be back til next Wednesday and was wanting to see if it could be filled today. CB # [phone]

## 2017-03-10 ENCOUNTER — Encounter (INDEPENDENT_AMBULATORY_CARE_PROVIDER_SITE_OTHER): Payer: Self-pay | Admitting: Orthopaedic Surgery

## 2017-03-10 ENCOUNTER — Ambulatory Visit (INDEPENDENT_AMBULATORY_CARE_PROVIDER_SITE_OTHER): Payer: 59

## 2017-03-10 ENCOUNTER — Ambulatory Visit (INDEPENDENT_AMBULATORY_CARE_PROVIDER_SITE_OTHER): Payer: 59 | Admitting: Orthopaedic Surgery

## 2017-03-10 DIAGNOSIS — G8929 Other chronic pain: Secondary | ICD-10-CM

## 2017-03-10 DIAGNOSIS — M25561 Pain in right knee: Secondary | ICD-10-CM | POA: Diagnosis not present

## 2017-03-10 DIAGNOSIS — Z96651 Presence of right artificial knee joint: Secondary | ICD-10-CM

## 2017-03-10 DIAGNOSIS — M1711 Unilateral primary osteoarthritis, right knee: Secondary | ICD-10-CM

## 2017-03-10 NOTE — Progress Notes (Signed)
Patient ID: Taylor Heath, male   DOB: 09-12-1960, 57 y.o.   MRN: [id number]  6 week TKA follow up plan  Patient presents for follow up 6 weeks status post right total knee replacement. The wound is healed and there is no evidence of infection. TED hose may be discontinued. Radiographs reveal a total hip arthroplasty in good position, with no evidence of subsidence, loosening, or complicating features. It was reinforced that with any procedure including dental work, colonoscopy, or any invasive procedure that pre-procedural prophylactic antibiotics must be taken to decrease the risk of infection. Reminders were given about signs to be aware of including redness, drainage, increased pain, fevers, calf pain, shortness of breath, or any concern should generate a phone call or a return to see [place] immediately. Will plan to follow up at 3 months postoperatively for next evaluation.  Continue with PT for the next 3-4 weeks.

## 2017-04-26 ENCOUNTER — Ambulatory Visit (INDEPENDENT_AMBULATORY_CARE_PROVIDER_SITE_OTHER): Payer: 59 | Admitting: Orthopaedic Surgery

## 2017-04-26 ENCOUNTER — Ambulatory Visit (INDEPENDENT_AMBULATORY_CARE_PROVIDER_SITE_OTHER): Payer: Managed Care, Other (non HMO)

## 2017-04-26 DIAGNOSIS — M5412 Radiculopathy, cervical region: Secondary | ICD-10-CM | POA: Insufficient documentation

## 2017-04-26 DIAGNOSIS — M19031 Primary osteoarthritis, right wrist: Secondary | ICD-10-CM | POA: Diagnosis not present

## 2017-04-26 DIAGNOSIS — Z96651 Presence of right artificial knee joint: Secondary | ICD-10-CM | POA: Diagnosis not present

## 2017-04-26 MED ORDER — PREDNISONE 10 MG (21) PO TBPK: ORAL_TABLET | ORAL | 0 refills | Status: DC

## 2017-04-26 NOTE — Progress Notes (Signed)
Office Visit Note   Patient: Taylor Heath           Date of Birth: 09-27-1960           MRN: [id number] Visit Date: 04/26/2017              Requested by: [facility] [address] [place], [place] [address] PCP: [facility]   Assessment & Plan: Visit Diagnoses:  1. Status post total right knee replacement   2. Primary osteoarthritis of right wrist   3. Radiculopathy of cervical spine     Plan: In regards to Taylor Heath's knee, we are pleased he is doing so well.  Follow-up with [place] in 3 months time for repeat evaluation and x-rays.  In regards to the right wrist, I think he is having a combination of arthritic symptoms from the wrist but more so radiculopathy from his neck.  We are to call in a Sterapred Dosepak.  He will also use a home traction unit that he already has.  If he is not any better he will let us know and we will obtain an MRI of his neck.  Otherwise follow-up with [place] in 3 months for repeat evaluation and x-rays of the knee.  Follow-Up Instructions: Return in about 3 months (around 07/24/2017).   Orders:  Orders Placed This Encounter  Procedures  . XR Wrist Complete Right  . XR Cervical Spine 2 or 3 views   Meds ordered this encounter  Medications  . predniSONE (STERAPRED UNI-PAK 21 TAB) 10 MG (21) TBPK tablet    Sig: Take as directed    Dispense:  21 tablet    Refill:  0      Procedures: No procedures performed   Clinical Data: No additional findings.   Subjective: Chief Complaint  Patient presents with  . Right Knee - Follow-up    01/27/17 Right TKA  . Right Wrist - Pain, Edema    HPI Taylor Heath comes in for follow-up.  89 days status post right total knee replacement, date of surgery 01/27/2017.  Doing excellent in regards the right knee.  He does admit to some occasional aching but really nothing more.  He is regained full motion and full strength.  Other issue he brings up today is his right wrist.   History of volar ganglion cyst excision as well as gamekeepers thumb repair several years back.  He has known diffuse osteoarthritis to the wrist and has had this injected several times in the past.  He now has diffuse pain to the wrist but in addition, is having numbness to the index long ring and small fingers.  This occurs during the day as well as at night.  No specific aggravators.  He does note that he has seen a neurosurgeon in the past and was given a home traction unit as well as a steroid Dosepak.  These significantly helped.  Review of Systems as detailed in HPI.  All others reviewed and are negative.  Objective: Vital Signs: There were no vitals taken for this visit.  Physical Exam well-developed well-nourished gentleman in no acute distress.  Alert and oriented x3.  Ortho Exam examination of his right knee reveals range of motion from 0-125 degrees.  Trace effusion.  He is stable valgus varus stress.  Examination of his right wrist reveals minimal diffuse tenderness.  Decreased sensation to the index long ring and small fingers.  Specialty Comments:  No specialty comments available.  Imaging: Xr Wrist Complete Right  Result Date: 04/26/2017 X-rays of the wrist are negative for structural abnormalities.  Xr Cervical Spine 2 Or 3 Views  Result Date: 04/26/2017 X-rays of the cervical spine reveal severe degenerative disc disease at C3 through 6    PMFS History: Patient Active Problem List   Diagnosis Date Noted  . Primary osteoarthritis of right wrist 04/26/2017  . Radiculopathy of cervical spine 04/26/2017  . Total knee replacement status 01/27/2017  . Primary osteoarthritis of right shoulder 12/30/2016  . Primary osteoarthritis of right knee 12/30/2016   Past Medical History:  Diagnosis Date  . Anxiety   . Arthritis   . History of kidney stones     No family history on file.  Past Surgical History:  Procedure Laterality Date  . JOINT REPLACEMENT     left hip  replaced  . KNEE ARTHROSCOPY W/ ACL RECONSTRUCTION Right 2009  . ORIF FINGER / THUMB FRACTURE    . TOTAL KNEE ARTHROPLASTY Right 01/27/2017   Procedure: RIGHT TOTAL KNEE ARTHROPLASTY;  Surgeon: Tarry Kos, MD;  Location: [facility] OR;  Service: Orthopedics;  Laterality: Right;   Social History   Occupational History  . Not on file  Tobacco Use  . Smoking status: Former Smoker    Types: Cigarettes    Last attempt to quit: 04/21/2010    Years since quitting: 7.0  . Smokeless tobacco: Never Used  Substance and Sexual Activity  . Alcohol use: No    Frequency: Never  . Drug use: No  . Sexual activity: Not on file

## 2017-07-26 ENCOUNTER — Encounter (INDEPENDENT_AMBULATORY_CARE_PROVIDER_SITE_OTHER): Payer: Self-pay | Admitting: Orthopaedic Surgery

## 2017-07-26 ENCOUNTER — Ambulatory Visit (INDEPENDENT_AMBULATORY_CARE_PROVIDER_SITE_OTHER): Payer: Managed Care, Other (non HMO)

## 2017-07-26 ENCOUNTER — Ambulatory Visit (INDEPENDENT_AMBULATORY_CARE_PROVIDER_SITE_OTHER): Payer: 59 | Admitting: Orthopaedic Surgery

## 2017-07-26 VITALS — Ht 70.0 in | Wt 185.0 lb

## 2017-07-26 DIAGNOSIS — M5030 Other cervical disc degeneration, unspecified cervical region: Secondary | ICD-10-CM | POA: Diagnosis not present

## 2017-07-26 DIAGNOSIS — Z96651 Presence of right artificial knee joint: Secondary | ICD-10-CM

## 2017-07-26 NOTE — Progress Notes (Signed)
Office Visit Note   Patient: Taylor Heath           Date of Birth: [birth year]           MRN: [id number] Visit Date: 07/26/2017              Requested by: [facility] [address] [place], [place] [address] PCP: [facility]   Assessment & Plan: Visit Diagnoses:  1. Status post total right knee replacement   2. Other cervical disc degeneration, unspecified cervical region     Plan: In regards to the right knee, I am pleased with his progress.  He will follow-up with [place] in 6 months time for repeat evaluation x-ray.  In regards to the neck, we will obtain an MRI of the cervical spine.  He will follow-up with Dr. Alvester Morin for review and epidural steroid injection.  Call if concerns or questions in the meantime.  Follow-Up Instructions: Return in about 6 months (around 01/26/2018).   Orders:  Orders Placed This Encounter  Procedures  . XR Knee 1-2 Views Right  . MR Cervical Spine w/o contrast  . Ambulatory referral to Physical Medicine Rehab   No orders of the defined types were placed in this encounter.     Procedures: No procedures performed   Clinical Data: No additional findings.   Subjective: Chief Complaint  Patient presents with  . Right Knee - Follow-up    01/27/17 right total knee replacement   . Right Wrist - Pain, Numbness  . Neck - Pain    HPI patient is a pleasant 58 year old gentleman who presents to our clinic today 6 months status post right total knee replacement, date of surgery 01/27/2018.  Doing excellent in regards to the right knee.  Occasional lateral sided pain but nothing more.  He also notes continued pain to his neck And bilateral upper extremity radiculopathy.  He has a history of cervical spine osteoarthritis and has undergone multiple steroid Dosepaks, physical therapy and home traction unit.  No previous epidural steroid injection or surgical intervention.  He denies any previous MRI of the  cervical spine.  Review of Systems as detailed in HPI.  All others reviewed and are negative.   Objective: Vital Signs: Ht 5\' 10"  (1.778 m)   Wt 185 lb (83.9 kg)   BMI 26.54 kg/m   Physical Exam well-developed well-nourished gentleman in no acute distress.  Alert and oriented x3.  Ortho Exam examination of his right knee shows range of motion from 0 to 125 degrees.  Stable to valgus and varus stress.  He is neurovascularly intact distally.  Specialty Comments:  No specialty comments available.  Imaging: Xr Knee 1-2 Views Right  Result Date: 07/26/2017 X-rays show well-seated prosthesis    PMFS History: Patient Active Problem List   Diagnosis Date Noted  . Primary osteoarthritis of right wrist 04/26/2017  . Radiculopathy of cervical spine 04/26/2017  . Total knee replacement status 01/27/2017  . Primary osteoarthritis of right shoulder 12/30/2016  . Primary osteoarthritis of right knee 12/30/2016   Past Medical History:  Diagnosis Date  . Anxiety   . Arthritis   . History of kidney stones     No family history on file.  Past Surgical History:  Procedure Laterality Date  . JOINT REPLACEMENT     left hip replaced  . KNEE ARTHROSCOPY W/ ACL RECONSTRUCTION Right 2009  . ORIF FINGER / THUMB FRACTURE    . TOTAL KNEE  ARTHROPLASTY Right 01/27/2017   Procedure: RIGHT TOTAL KNEE ARTHROPLASTY;  Surgeon: Tarry Kos, MD;  Location: [facility] OR;  Service: Orthopedics;  Laterality: Right;   Social History   Occupational History  . Not on file  Tobacco Use  . Smoking status: Former Smoker    Types: Cigarettes    Last attempt to quit: 04/21/2010    Years since quitting: 7.2  . Smokeless tobacco: Never Used  Substance and Sexual Activity  . Alcohol use: No    Frequency: Never  . Drug use: No  . Sexual activity: Not on file

## 2017-07-29 ENCOUNTER — Telehealth (INDEPENDENT_AMBULATORY_CARE_PROVIDER_SITE_OTHER): Payer: Self-pay | Admitting: Orthopaedic Surgery

## 2017-07-29 ENCOUNTER — Telehealth (INDEPENDENT_AMBULATORY_CARE_PROVIDER_SITE_OTHER): Payer: Self-pay | Admitting: *Deleted

## 2017-07-29 NOTE — Telephone Encounter (Signed)
See message below °

## 2017-07-29 NOTE — Telephone Encounter (Signed)
[employer] called pt MRI is approved,Pt would like to have an injection in neck after MRI.

## 2017-07-30 NOTE — Telephone Encounter (Signed)
Ok sounds good

## 2017-08-01 ENCOUNTER — Other Ambulatory Visit (INDEPENDENT_AMBULATORY_CARE_PROVIDER_SITE_OTHER): Payer: Self-pay

## 2017-08-01 DIAGNOSIS — M5412 Radiculopathy, cervical region: Secondary | ICD-10-CM

## 2017-08-04 ENCOUNTER — Ambulatory Visit
Admission: RE | Admit: 2017-08-04 | Discharge: 2017-08-04 | Disposition: A | Discharge status: Home / Self Care | Payer: Managed Care, Other (non HMO) | Source: Ambulatory Visit | Attending: Physician Assistant | Admitting: Physician Assistant

## 2017-08-04 DIAGNOSIS — M5030 Other cervical disc degeneration, unspecified cervical region: Secondary | ICD-10-CM

## 2017-08-05 NOTE — Progress Notes (Signed)
Will you make sure patient has an appt with [place] for mri discussion and ESI?

## 2017-08-09 ENCOUNTER — Ambulatory Visit (INDEPENDENT_AMBULATORY_CARE_PROVIDER_SITE_OTHER): Payer: Managed Care, Other (non HMO) | Admitting: Orthopaedic Surgery

## 2017-08-09 NOTE — Progress Notes (Signed)
thanks

## 2017-08-18 ENCOUNTER — Other Ambulatory Visit (INDEPENDENT_AMBULATORY_CARE_PROVIDER_SITE_OTHER): Payer: Self-pay | Admitting: Physician Assistant

## 2017-08-18 MED ORDER — AMOXICILLIN 500 MG PO CAPS: ORAL_CAPSULE | ORAL | 1 refills | Status: DC

## 2017-08-26 ENCOUNTER — Telehealth (INDEPENDENT_AMBULATORY_CARE_PROVIDER_SITE_OTHER): Payer: Self-pay | Admitting: Physical Medicine and Rehabilitation

## 2017-08-26 ENCOUNTER — Ambulatory Visit (INDEPENDENT_AMBULATORY_CARE_PROVIDER_SITE_OTHER): Payer: Managed Care, Other (non HMO) | Admitting: Physical Medicine and Rehabilitation

## 2017-08-26 ENCOUNTER — Encounter (INDEPENDENT_AMBULATORY_CARE_PROVIDER_SITE_OTHER): Payer: Self-pay | Admitting: Physical Medicine and Rehabilitation

## 2017-08-26 ENCOUNTER — Telehealth (INDEPENDENT_AMBULATORY_CARE_PROVIDER_SITE_OTHER): Payer: Self-pay | Admitting: *Deleted

## 2017-08-26 VITALS — BP 137/102 | HR 69 | Temp 98.5°F

## 2017-08-26 DIAGNOSIS — M5412 Radiculopathy, cervical region: Secondary | ICD-10-CM | POA: Diagnosis not present

## 2017-08-26 NOTE — Progress Notes (Signed)
Taylor Heath - 57 y.o. male MRN [id number]  Date of birth: [birth year]  Office Visit Note: Visit Date: 08/26/2017 PCP: [facility] by[facility]  Subjective: Chief Complaint  Patient presents with  . Left Shoulder - Pain  . Right Arm - Pain  . Right Hand - Numbness   HPI: Taylor Heath is a 57 year old right-hand-dominant gentleman who comes in today at the request of Dr. Glee Arvin for evaluation management of his predominantly left shoulder pain and right more than left tingling paresthesias in the hand.  He underwent total knee arthroplasty by Dr. Glee Arvin in November of last year.  He has a history of prior cervical spine issue with left-sided and right-sided pain in the past.  He reports seeing a couple of doctors and was given a Medrol Dosepak and home traction and this seemed to alleviate the symptoms 14 years ago.  Since that time he said off and on pain but over the last several weeks has had worsening issues into the left shoulder.  He has had prior surgery on the left and prior injection on the left many years ago.  He reports history of being told her right shoulder knees replacement with arthritis.  He reports in the past only pain without the paresthesias.  He is getting paresthesia in the right hand in a somewhat nondermatomal fashion but really in the middle 2 digits which be more of a C7 distribution.  Dr. Roda Shutters has been following him conservatively with watching him and medication management.  He did have a recent Medrol Dosepak probably 4 months ago.  Again this is been progressive worsening symptoms over time without specific injury.  He denies any frank neck pain and no worse with neck rotation.  MRI of the cervical spine was obtained by Dr. Rhona Leavens and this is reviewed with the patient today and we did give him a report.  We did go over the images with him along with a spine model.  He has not noted any focal weakness but is felt weak.  No  associated headaches or double vision.  No associated weight loss.  Still remains pretty active.  Has had no prior cervical surgery or cervical injections.  He is somewhat confused on the terms cortisone injections and epidurals etc.   Review of Systems  Constitutional: Negative for chills, fever, malaise/fatigue and weight loss.  HENT: Negative for hearing loss and sinus pain.   Eyes: Negative for blurred vision, double vision and photophobia.  Respiratory: Negative for cough and shortness of breath.   Cardiovascular: Negative for chest pain, palpitations and leg swelling.  Gastrointestinal: Negative for abdominal pain, nausea and vomiting.  Genitourinary: Negative for flank pain.  Musculoskeletal: Positive for joint pain. Negative for myalgias.       Left shoulder pain in bilateral right more than left hand paresthesias  Skin: Negative for itching and rash.  Neurological: Positive for tingling. Negative for tremors, focal weakness and weakness.  Endo/Heme/Allergies: Negative.   Psychiatric/Behavioral: Negative for depression.  All other systems reviewed and are negative.  Otherwise per HPI.  Assessment & Plan: Visit Diagnoses:  1. Cervical radiculopathy     Plan: Findings:  His biggest complaint really is the left shoulder that is worse with shoulder movement and on exam he seems to be somewhat worse with rotation and impingement signs.  He clearly gets some pain that is not related to movement and is more of an aching pain.  No paresthesias on the left.  On the right the paresthesias do occur more of a C7 distribution but could be carpal tunnel type symptoms as well on the right.  He has not had any electrodiagnostic studies or ever been diagnosed with carpal tunnel syndrome.  Previous history of cervical spine problem resolved with conservative care but ongoing symptoms this time despite conservative care.  MRI review shows pretty significant disc osteophyte with uncinated spurring  particular at the C4-5 level with right more than left foraminal narrowing and central canal narrowing.  There is multifactorial central canal narrowing C5-6 as well.  Either of these could cause shoulder pain and paresthesia.  I think the best approach is to complete a cervical epidural injection diagnostically and hopefully therapeutically.  If he gets relief of the tingling in some of the pain but his left shoulder still problematic we would look at an intra-articular shoulder injection.  If the shoulder injection turns out to be helpful in the epidural not then will refer him back to Dr. Roda Shutters for continued management of his shoulder.  He may ultimately need to be a surgical candidate for ACDF.  He has fairly good stenosis but still some room here and this was showed to him today on the models.  No change in medication at this point.  If he does not get relief of the tingling that I think the next step would probably be electrodiagnostic study.    Meds & Orders: No orders of the defined types were placed in this encounter.  No orders of the defined types were placed in this encounter.   Follow-up: Return for C7-T1 interlaminar epidural steroid injection.   Procedures: No procedures performed  No notes on file   Clinical History: No specialty comments available.   He reports that he quit smoking about 7 years ago. His smoking use included cigarettes. He has never used smokeless tobacco. No results for input(s): HGBA1C, LABURIC in the last 8760 hours.  Objective:  VS:  HT:    WT:   BMI:     BP:(!) 137/102  HR:69bpm  TEMP:98.5 F (36.9 C)(Oral)  RESP:96 % Physical Exam  Constitutional: He is oriented to person, place, and time. He appears well-developed and well-nourished. No distress.  HENT:  Head: Normocephalic and atraumatic.  Nose: Nose normal.  Mouth/Throat: Oropharynx is clear and moist.  Eyes: Pupils are equal, round, and reactive to light. Conjunctivae are normal.  Neck: Normal  range of motion. Neck supple. No tracheal deviation present.  Cardiovascular: Regular rhythm and intact distal pulses.  Pulmonary/Chest: Effort normal and breath sounds normal.  Abdominal: Soft. He exhibits no distension. There is no rebound and no guarding.  Musculoskeletal: He exhibits no deformity.  Patient sits with forward flexed cervical spine.  He has good range of motion left and right rotation with some pain on extension.  Really a negative Spurling's test but equivocal does have some pain.  Left shoulder seems to have more impingement sign in the right.  He has more pain with internal rotation.  He cannot really reach behind him on that side.  In terms of strength he has good strength with muscle groups including shoulder abduction bicep flexion as well as wrist flexion and long finger flexion wrist extension and finger abduction.  He does have diminished reflex on the right bicep versus the left.  He has a negative Hoffman's test bilaterally.  He has no real focal trigger points although he is somewhat tender across the  upper musculature.  Neurological: He is alert and oriented to person, place, and time. He exhibits normal muscle tone. Coordination normal.  Skin: Skin is warm. No rash noted.  Psychiatric: He has a normal mood and affect. His behavior is normal.  Nursing note and vitals reviewed.   Ortho Exam Imaging: No results found.  Past Medical/Family/Surgical/Social History: Medications & Allergies reviewed per EMR, new medications updated. Patient Active Problem List   Diagnosis Date Noted  . Primary osteoarthritis of right wrist 04/26/2017  . Radiculopathy of cervical spine 04/26/2017  . Total knee replacement status 01/27/2017  . Primary osteoarthritis of right shoulder 12/30/2016  . Primary osteoarthritis of right knee 12/30/2016   Past Medical History:  Diagnosis Date  . Anxiety   . Arthritis   . History of kidney stones    History reviewed. No pertinent family  history. Past Surgical History:  Procedure Laterality Date  . JOINT REPLACEMENT     left hip replaced  . KNEE ARTHROSCOPY W/ ACL RECONSTRUCTION Right 2009  . ORIF FINGER / THUMB FRACTURE    . TOTAL KNEE ARTHROPLASTY Right 01/27/2017   Procedure: RIGHT TOTAL KNEE ARTHROPLASTY;  Surgeon: Tarry Kos, MD;  Location: [facility] OR;  Service: Orthopedics;  Laterality: Right;   Social History   Occupational History  . Not on file  Tobacco Use  . Smoking status: Former Smoker    Types: Cigarettes    Last attempt to quit: 04/21/2010    Years since quitting: 7.3  . Smokeless tobacco: Never Used  Substance and Sexual Activity  . Alcohol use: No    Frequency: Never  . Drug use: No  . Sexual activity: Not on file

## 2017-08-26 NOTE — Progress Notes (Signed)
 .  Numeric Pain Rating Scale and Functional Assessment Average Pain 6 Pain Right Now 3 My pain is constant and aching Pain is worse with: walking and sitting Pain improves with: rest   In the last MONTH (on 0-10 scale) has pain interfered with the following?  1. General activity like being  able to carry out your everyday physical activities such as walking, climbing stairs, carrying groceries, or moving a chair?  Rating(4)  2. Relation with others like being able to carry out your usual social activities and roles such as  activities at home, at work and in your community. Rating(2)  3. Enjoyment of life such that you have  been bothered by emotional problems such as feeling anxious, depressed or irritable?  Rating(0)

## 2017-08-30 NOTE — Telephone Encounter (Signed)
Approved by Rosann Auerbach.

## 2017-09-06 ENCOUNTER — Encounter (INDEPENDENT_AMBULATORY_CARE_PROVIDER_SITE_OTHER): Payer: Self-pay | Admitting: Physical Medicine and Rehabilitation

## 2017-09-06 ENCOUNTER — Ambulatory Visit (INDEPENDENT_AMBULATORY_CARE_PROVIDER_SITE_OTHER): Payer: Managed Care, Other (non HMO) | Admitting: Physical Medicine and Rehabilitation

## 2017-09-06 ENCOUNTER — Ambulatory Visit (INDEPENDENT_AMBULATORY_CARE_PROVIDER_SITE_OTHER): Payer: Self-pay

## 2017-09-06 VITALS — BP 137/86 | HR 67

## 2017-09-06 DIAGNOSIS — M5412 Radiculopathy, cervical region: Secondary | ICD-10-CM

## 2017-09-06 MED ORDER — METHYLPREDNISOLONE ACETATE 80 MG/ML IJ SUSP
80.0000 mg | Freq: Once | INTRAMUSCULAR | Status: AC
  Administered 2017-09-06: 80 mg

## 2017-09-06 NOTE — Patient Instructions (Signed)

## 2017-09-06 NOTE — Progress Notes (Signed)
 .  Numeric Pain Rating Scale and Functional Assessment Average Pain 3   In the last MONTH (on 0-10 scale) has pain interfered with the following?  1. General activity like being  able to carry out your everyday physical activities such as walking, climbing stairs, carrying groceries, or moving a chair?  Rating(2)   +Driver, -BT, -Dye Allergies.  

## 2017-09-07 NOTE — Progress Notes (Signed)
VERNON MAISH - 57 y.o. male MRN [id number]  Date of birth: 1960-10-23  Office Visit Note: Visit Date: 09/06/2017 PCP: [facility] by: [facility]  Subjective: Chief Complaint  Patient presents with  . Right Hand - Numbness  . Left Shoulder - Pain  . Neck - Pain   HPI: Mr. Brahm is a 57 year old right-hand-dominant gentleman who comes in today for planned left C7-T1 interlaminar epidural steroid injection.  Please see our prior evaluation and management note for further details and justification.   ROS Otherwise per HPI.  Assessment & Plan: Visit Diagnoses:  1. Cervical radiculopathy     Plan: No additional findings.   Meds & Orders:  Meds ordered this encounter  Medications  . methylPREDNISolone acetate (DEPO-MEDROL) injection 80 mg    Orders Placed This Encounter  Procedures  . XR C-ARM NO REPORT  . Epidural Steroid injection    Follow-up: Return if symptoms worsen or fail to improve.   Procedures: No procedures performed  Cervical Epidural Steroid Injection - Interlaminar Approach with Fluoroscopic Guidance  Patient: KIMO BANCROFT      Date of Birth: 19-Apr-1960 MRN: [id number] PCP: [facility]      Visit Date: 09/06/2017   Universal Protocol:    Date/Time: 07/10/196:23 AM  Consent Given By: the patient  Position: PRONE  Additional Comments: Vital signs were monitored before and after the procedure. Patient was prepped and draped in the usual sterile fashion. The correct patient, procedure, and site was verified.   Injection Procedure Details:  Procedure Site One Meds Administered:  Meds ordered this encounter  Medications  . methylPREDNISolone acetate (DEPO-MEDROL) injection 80 mg     Laterality: Left  Location/Site: C7-T1  Needle size: 20 G  Needle type: Touhy  Needle Placement: Paramedian epidural space  Findings:  -Comments: Excellent flow of contrast into the  epidural space.  Procedure Details: Using a paramedian approach from the side mentioned above, the region overlying the inferior lamina was localized under fluoroscopic visualization and the soft tissues overlying this structure were infiltrated with 4 ml. of 1% Lidocaine without Epinephrine. A # 20 gauge, Tuohy needle was inserted into the epidural space using a paramedian approach.  The epidural space was localized using loss of resistance along with lateral and contralateral oblique bi-planar fluoroscopic views.  After negative aspirate for air, blood, and CSF, a 2 ml. volume of Isovue-250 was injected into the epidural space and the flow of contrast was observed. Radiographs were obtained for documentation purposes.   The injectate was administered into the level noted above.  Additional Comments:  The patient tolerated the procedure well Dressing: Band-Aid    Post-procedure details: Patient was observed during the procedure. Post-procedure instructions were reviewed.  Patient left the clinic in stable condition.    Clinical History: No specialty comments available.   He reports that he quit smoking about 7 years ago. His smoking use included cigarettes. He has never used smokeless tobacco. No results for input(s): HGBA1C, LABURIC in the last 8760 hours.  Objective:  VS:  HT:    WT:   BMI:     BP:137/86  HR:67bpm  TEMP: ( )  RESP:  Physical Exam  Ortho Exam Imaging: Xr C-arm No Report  Result Date: 09/06/2017 Please see Notes tab for imaging impression.   Past Medical/Family/Surgical/Social History: Medications & Allergies reviewed per EMR, new medications updated. Patient Active Problem List   Diagnosis Date Noted  . Primary  osteoarthritis of right wrist 04/26/2017  . Radiculopathy of cervical spine 04/26/2017  . Total knee replacement status 01/27/2017  . Primary osteoarthritis of right shoulder 12/30/2016  . Primary osteoarthritis of right knee 12/30/2016    Past Medical History:  Diagnosis Date  . Anxiety   . Arthritis   . History of kidney stones    History reviewed. No pertinent family history. Past Surgical History:  Procedure Laterality Date  . JOINT REPLACEMENT     left hip replaced  . KNEE ARTHROSCOPY W/ ACL RECONSTRUCTION Right 2009  . ORIF FINGER / THUMB FRACTURE    . TOTAL KNEE ARTHROPLASTY Right 01/27/2017   Procedure: RIGHT TOTAL KNEE ARTHROPLASTY;  Surgeon: Tarry Kos, MD;  Location: [facility] OR;  Service: Orthopedics;  Laterality: Right;   Social History   Occupational History  . Not on file  Tobacco Use  . Smoking status: Former Smoker    Types: Cigarettes    Last attempt to quit: 04/21/2010    Years since quitting: 7.3  . Smokeless tobacco: Never Used  Substance and Sexual Activity  . Alcohol use: No    Frequency: Never  . Drug use: No  . Sexual activity: Not on file

## 2017-09-07 NOTE — Procedures (Signed)
Cervical Epidural Steroid Injection - Interlaminar Approach with Fluoroscopic Guidance  Patient: Taylor Heath      Date of Birth: 05-Jul-1960 MRN: [id number] PCP: [facility]      Visit Date: 09/06/2017   Universal Protocol:    Date/Time: 07/10/196:23 AM  Consent Given By: the patient  Position: PRONE  Additional Comments: Vital signs were monitored before and after the procedure. Patient was prepped and draped in the usual sterile fashion. The correct patient, procedure, and site was verified.   Injection Procedure Details:  Procedure Site One Meds Administered:  Meds ordered this encounter  Medications  . methylPREDNISolone acetate (DEPO-MEDROL) injection 80 mg     Laterality: Left  Location/Site: C7-T1  Needle size: 20 G  Needle type: Touhy  Needle Placement: Paramedian epidural space  Findings:  -Comments: Excellent flow of contrast into the epidural space.  Procedure Details: Using a paramedian approach from the side mentioned above, the region overlying the inferior lamina was localized under fluoroscopic visualization and the soft tissues overlying this structure were infiltrated with 4 ml. of 1% Lidocaine without Epinephrine. A # 20 gauge, Tuohy needle was inserted into the epidural space using a paramedian approach.  The epidural space was localized using loss of resistance along with lateral and contralateral oblique bi-planar fluoroscopic views.  After negative aspirate for air, blood, and CSF, a 2 ml. volume of Isovue-250 was injected into the epidural space and the flow of contrast was observed. Radiographs were obtained for documentation purposes.   The injectate was administered into the level noted above.  Additional Comments:  The patient tolerated the procedure well Dressing: Band-Aid    Post-procedure details: Patient was observed during the procedure. Post-procedure instructions were reviewed.  Patient left the clinic  in stable condition.

## 2017-09-16 NOTE — Telephone Encounter (Signed)
error 

## 2017-09-20 ENCOUNTER — Ambulatory Visit (INDEPENDENT_AMBULATORY_CARE_PROVIDER_SITE_OTHER): Payer: Managed Care, Other (non HMO) | Admitting: Physical Medicine and Rehabilitation

## 2017-09-20 ENCOUNTER — Encounter (INDEPENDENT_AMBULATORY_CARE_PROVIDER_SITE_OTHER): Payer: Self-pay | Admitting: Physical Medicine and Rehabilitation

## 2017-09-20 VITALS — BP 114/78 | HR 74

## 2017-09-20 DIAGNOSIS — M19011 Primary osteoarthritis, right shoulder: Secondary | ICD-10-CM | POA: Diagnosis not present

## 2017-09-20 DIAGNOSIS — M19031 Primary osteoarthritis, right wrist: Secondary | ICD-10-CM

## 2017-09-20 DIAGNOSIS — M5412 Radiculopathy, cervical region: Secondary | ICD-10-CM | POA: Diagnosis not present

## 2017-09-20 NOTE — Progress Notes (Signed)
Taylor Heath is a 57 year old gentleman that we completed a cervical epidural injection on 09/06/2017 and he reports 75% or more relief and is very happy.  He is getting a lot of sleep at night now that makes him much happier.  He is very pleased with the results of the injection.  He still has some hand pain is worse at night.  He will continue to follow-up with Dr. Roda Shutters for his orthopedic care.  Would repeat the injections and symptoms return.  Nothing new on review of systems and no other changes.  Physical exam is really unchanged he has good strength in the upper extremities bilaterally without deficit.Marland Kitchen  Marland KitchenNumeric Pain Rating Scale and Functional Assessment Average Pain 1   In the last MONTH (on 0-10 scale) has pain interfered with the following?  1. General activity like being  able to carry out your everyday physical activities such as walking, climbing stairs, carrying groceries, or moving a chair?  Rating(0)

## 2018-01-10 ENCOUNTER — Encounter (INDEPENDENT_AMBULATORY_CARE_PROVIDER_SITE_OTHER): Payer: Self-pay | Admitting: Orthopaedic Surgery

## 2018-01-10 ENCOUNTER — Ambulatory Visit (INDEPENDENT_AMBULATORY_CARE_PROVIDER_SITE_OTHER): Payer: Managed Care, Other (non HMO)

## 2018-01-10 ENCOUNTER — Ambulatory Visit (INDEPENDENT_AMBULATORY_CARE_PROVIDER_SITE_OTHER): Payer: Managed Care, Other (non HMO) | Admitting: Orthopaedic Surgery

## 2018-01-10 DIAGNOSIS — M1611 Unilateral primary osteoarthritis, right hip: Secondary | ICD-10-CM

## 2018-01-10 NOTE — Progress Notes (Signed)
Office Visit Note   Patient: Taylor Heath           Date of Birth: Jun 02, 1960           MRN: [id number] Visit Date: 01/10/2018              Requested by: [facility] [address] [place], [place] [address] PCP: [facility]   Assessment & Plan: Visit Diagnoses:  1. Primary osteoarthritis of right hip     Plan: Right hip pain secondary to arthritis.  X-rays obtained today show degenerative changes, narrowing of the superior joint line, and cam lesion.  Overall the patient's pain is still tolerable.  After discussing options, we will send her for an intra-articular steroid injection.  He will follow-up as needed   Follow-Up Instructions: No follow-ups on file.   Orders:  Orders Placed This Encounter  Procedures  . XR HIP UNILAT W OR W/O PELVIS 2-3 VIEWS RIGHT   No orders of the defined types were placed in this encounter.     Procedures: No procedures performed   Clinical Data: No additional findings.   Subjective: Chief Complaint  Patient presents with  . Right Hip - Pain    HPI  57 year old male with right hip pain for several months.  His pain has been waxing and waning.  Localizes pain more posteriorly.  Is worse at the end of the day and occasionally affects his sleep.  He feels his pain is started become similar to the pain he had in his left hip prior to hip replacement.  He denies any localized swelling or erythema.  No numbness or tingling down the leg.  No associated skin changes.  He has been able to continue with normal activities and states that he actually feels pretty good while active.  Review of Systems  See hpi   Objective: Vital Signs: There were no vitals taken for this visit.  Physical Exam  GEN: Awake, alert, no acute distress Pulmonary: Breathing unlabored  Ortho Exam Right hip:  - Inspection: No gross deformity, no swelling, erythema, or ecchymosis - Palpation: No tenderness over  the greater trochanter.  No tenderness posteriorly with piriformis. - ROM: Normal range of motion on Flexion, extension, abduction, internal and external rotation - Strength: Normal strength.  Mild pain with resisted hip flexion - Neuro/vasc: NV intact distally - Special Tests: Negative FABER.  Positive FADIR.  Positive Stinchfield.  Negative logroll   Specialty Comments:  No specialty comments available.  Imaging: No results found.   PMFS History: Patient Active Problem List   Diagnosis Date Noted  . Primary osteoarthritis of right wrist 04/26/2017  . Radiculopathy of cervical spine 04/26/2017  . Total knee replacement status 01/27/2017  . Primary osteoarthritis of right shoulder 12/30/2016  . Primary osteoarthritis of right knee 12/30/2016   Past Medical History:  Diagnosis Date  . Anxiety   . Arthritis   . History of kidney stones     History reviewed. No pertinent family history.  Past Surgical History:  Procedure Laterality Date  . JOINT REPLACEMENT     left hip replaced  . KNEE ARTHROSCOPY W/ ACL RECONSTRUCTION Right 2009  . ORIF FINGER / THUMB FRACTURE    . TOTAL KNEE ARTHROPLASTY Right 01/27/2017   Procedure: RIGHT TOTAL KNEE ARTHROPLASTY;  Surgeon: Tarry Kos, MD;  Location: [facility] OR;  Service: Orthopedics;  Laterality: Right;   Social History   Occupational History  . Not on  file  Tobacco Use  . Smoking status: Former Smoker    Types: Cigarettes    Last attempt to quit: 04/21/2010    Years since quitting: 7.7  . Smokeless tobacco: Never Used  Substance and Sexual Activity  . Alcohol use: No    Frequency: Never  . Drug use: No  . Sexual activity: Not on file

## 2018-01-10 NOTE — Progress Notes (Signed)
Subjective: He is here for ultrasound-guided diagnostic/therapeutic right hip injection.  He has osteoarthritis and is status post left hip replacement.  He is having primarily pain in the posterior right hip but occasional pain in the groin area.  Symptoms are worse when standing and walking for a long time.  His left leg is longer than the right and he thinks this makes his back hurt.  Objective: He has pain with passive hip flexion as well as internal rotation, pain in the groin area.  By palpation, most of his pain is in the region of the piriformis but I cannot completely reproduce his pain by palpation.  Impression: Right hip pain, primarily posterior, question foraminal stenosis versus pain from hip arthritis.  Plan: Diagnostic right hip injection.  If no improvement, then possibly lumbar MRI scan.  Procedure: Ultrasound-guided right hip injection: After sterile prep with Betadine, injected 5 cc 1% lidocaine without epinephrine and 40 mg methylprednisolone using a 22-gauge spinal needle, passing the needle through the iliofemoral ligament into the femoral head/neck junction.  Injectate was seen filling the joint capsule.  During the immediate anesthetic phase, he did not have a lot of change in his pain.

## 2018-10-17 IMAGING — MR MR CERVICAL SPINE W/O CM
4 of 5 series · 28 of 48 positions shown · non-contrast
Comparison: Cervical spine radiographs 04/26/2017.

CLINICAL DATA: Neck and left shoulder pain. Right wrist pain.
Numbness and swelling the upper extremities.

EXAM:
MRI CERVICAL SPINE WITHOUT CONTRAST
TECHNIQUE: Multiplanar, multisequence MR imaging of the cervical spine was
performed. No intravenous contrast was administered.

[Series 2: STIR · sagittal · 3.0mm · 0.82mm/px · 6 of 13 slices shown]
[im 1/13]
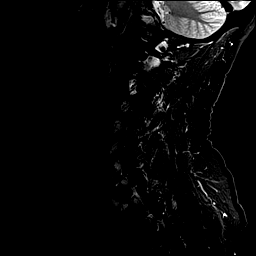
[im 3/13]
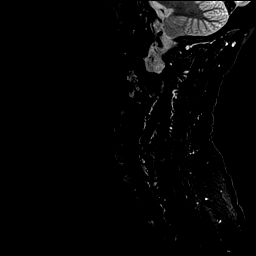
[im 5/13]
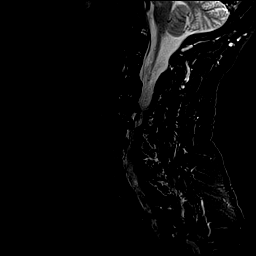
[im 8/13]
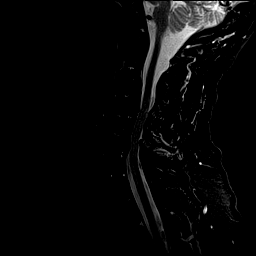
[im 10/13]
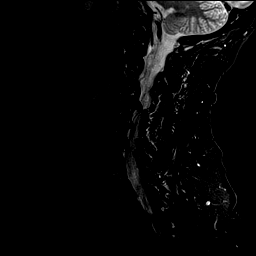
[im 13/13]
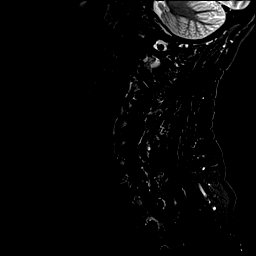

[Series 3: T1 · sagittal · 3.0mm · 0.41mm/px · 7 of 13 slices shown]
[im 1/13]
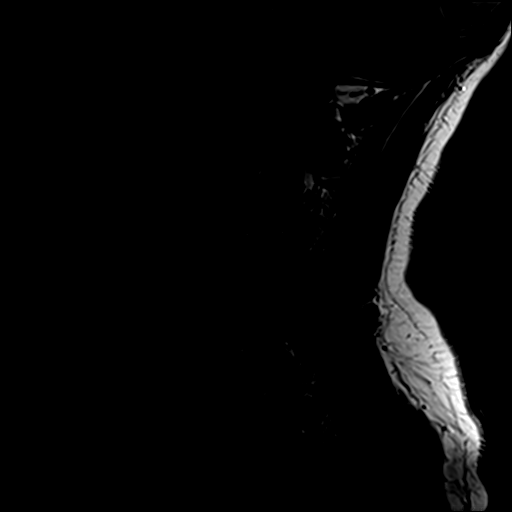
[im 3/13]
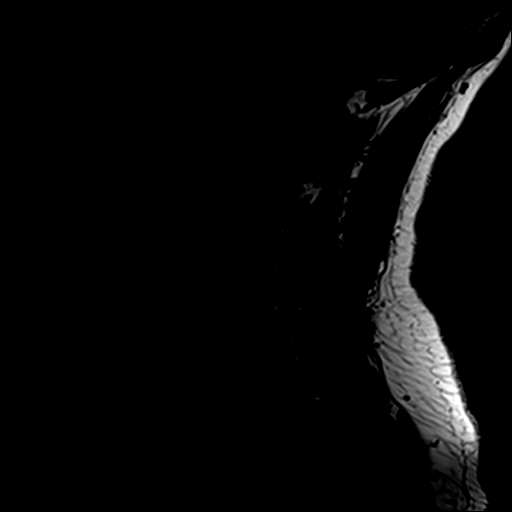
[im 5/13]
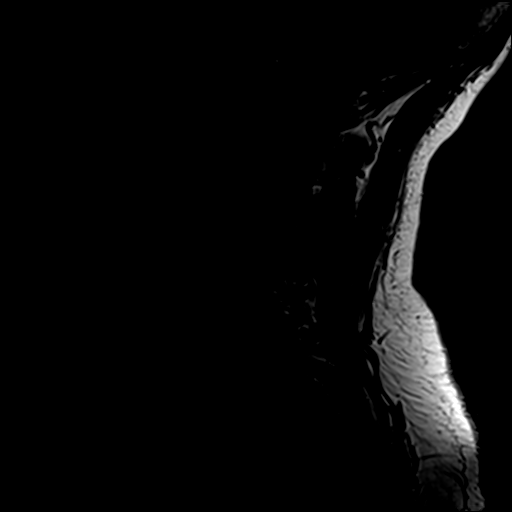
[im 7/13]
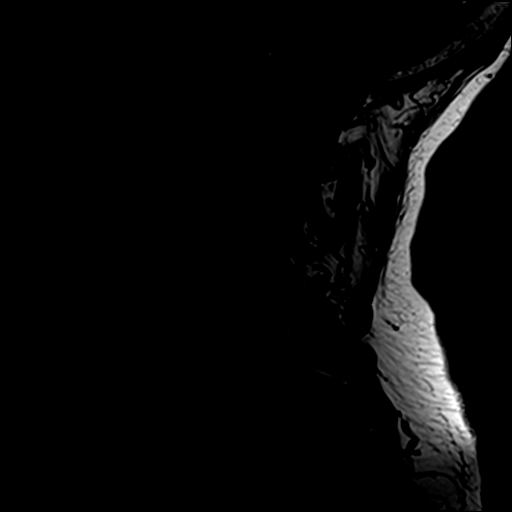
[im 9/13]
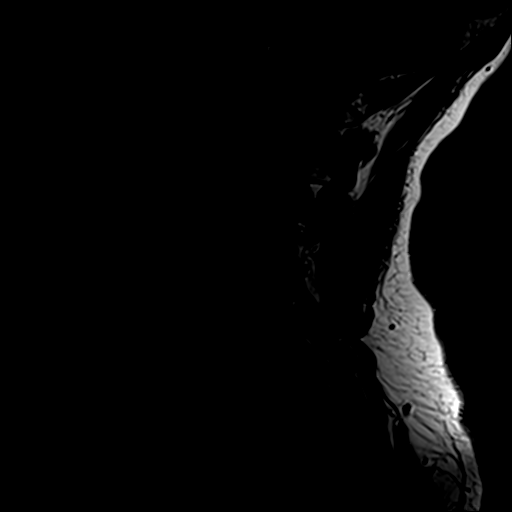
[im 11/13]
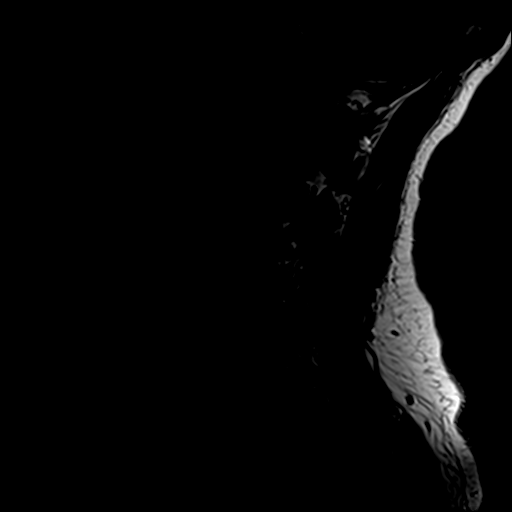
[im 13/13]
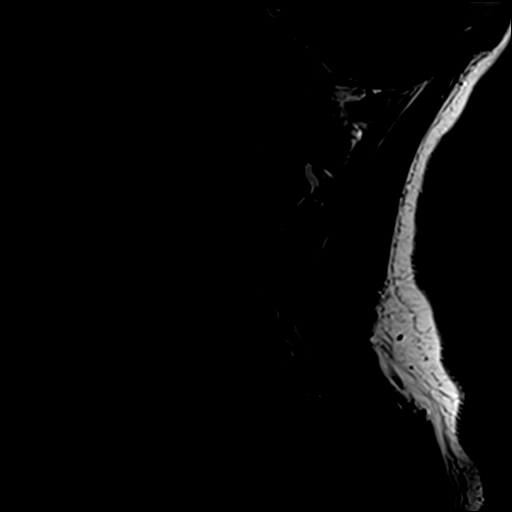

[Series 4: T2 · sagittal · 3.0mm · 0.41mm/px · 7 of 13 slices shown (1 of 2)]
[im 1/13]
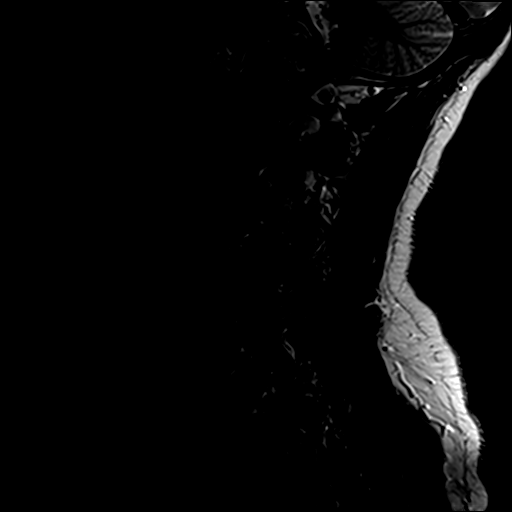
[im 3/13]
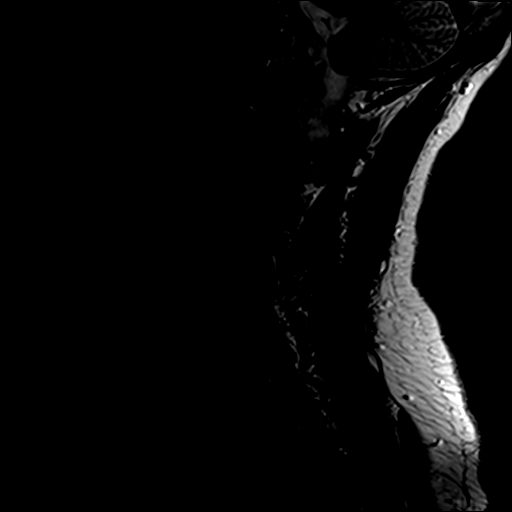
[im 5/13]
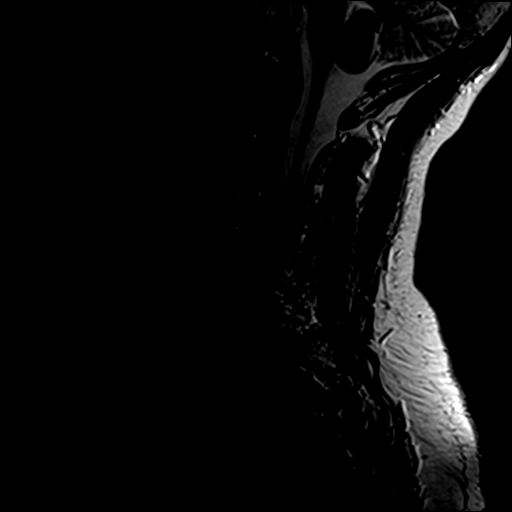
[im 7/13]
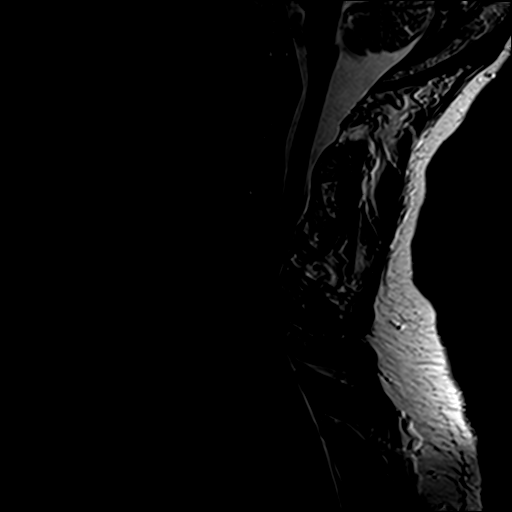
[im 9/13]
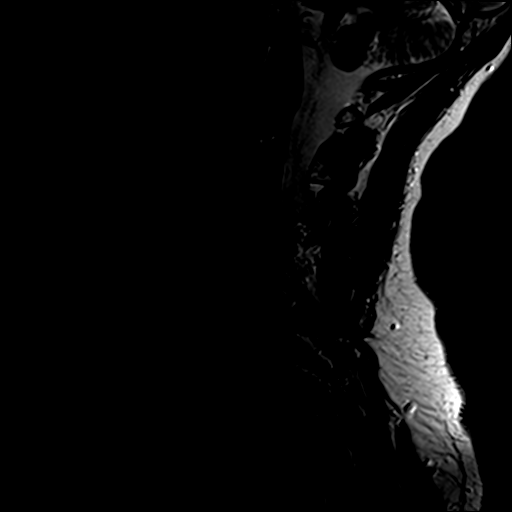
[im 11/13]
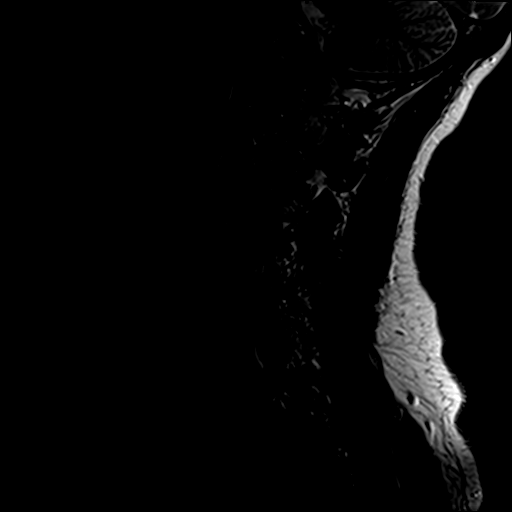
[im 13/13]
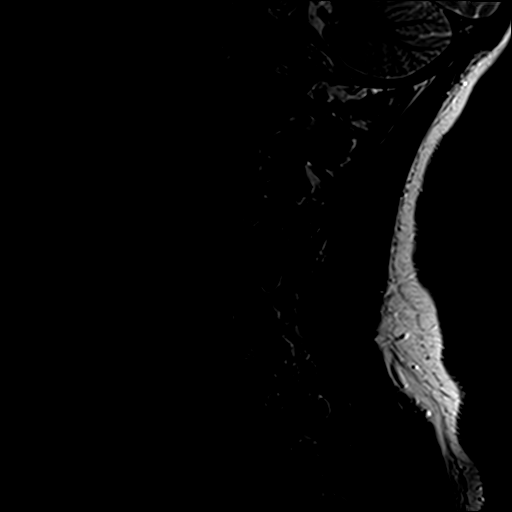

[Series 6: T2 · axial · 3.0mm · 0.70mm/px · z∈[-88,+3]mm · 8 of 26 slices shown (2 of 2)]
[im 1/26]
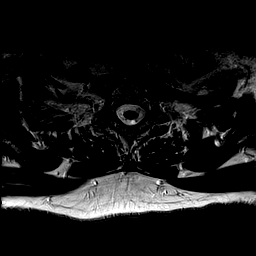
[im 4/26]
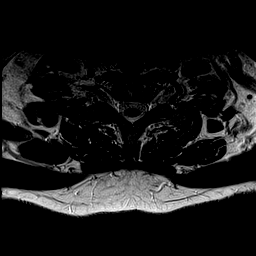
[im 8/26]
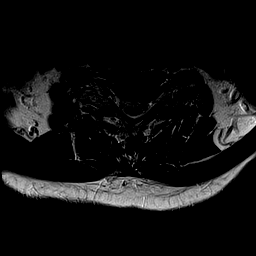
[im 12/26]
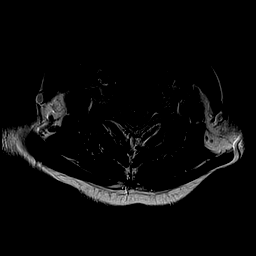
[im 14/26]
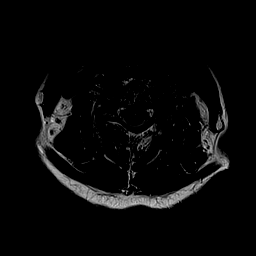
[im 18/26]
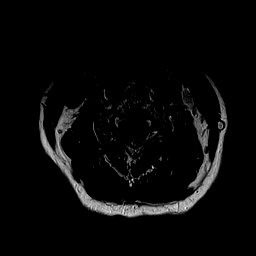
[im 22/26]
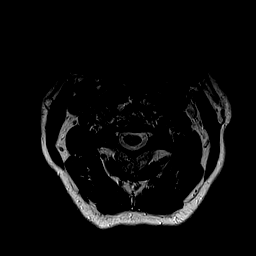
[im 26/26]
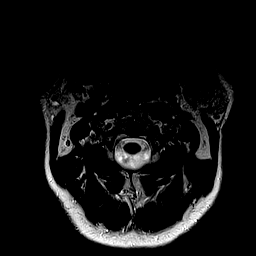

[28 of 48 positions shown; findings below may reference images not displayed]

FINDINGS: Alignment: Slight retrolisthesis is present at C3-4, C4-5, and C5-6.
There straightening of the normal cervical lordosis.

Vertebrae: Chronic endplate marrow changes are present at C3-4 and
C4-5. Vertebral body heights are normal. Marrow signal is otherwise
normal.

Cord: Normal signal is present in the cervical and upper thoracic
spinal cord to the lowest imaged level, T2-3.

Posterior Fossa, vertebral arteries, paraspinal tissues: The
craniocervical junction is normal. Visualized intracranial contents
are normal. Flow is present in the vertebral arteries bilaterally.
Paraspinous soft tissues are within normal limits.

Disc levels:

C2-3: Asymmetric right-sided uncovertebral spurring leads to mild
right foraminal stenosis.

C3-4: A broad-based disc osteophyte complex effaces the ventral CSF.
Uncovertebral disease leads to severe foraminal stenosis
bilaterally, left greater than right.

C4-5: A broad-based disc osteophyte complex is asymmetric to the
right. Uncovertebral and facet disease contribute to severe
foraminal stenosis, right greater than left. There is slight
distortion of the cord on the right.

C5-6: A broad-based disc osteophyte complex effaces the ventral CSF
and potentially contacts the ventral surface of the cord.
Uncovertebral spurring leads to severe foraminal stenosis
bilaterally.

C6-7: Uncovertebral disease is worse on the left. Mild foraminal
narrowing is present on the left.

C7-T1: Negative.
IMPRESSION: 1. Severe foraminal stenosis bilaterally at C3-4, C4-5, and C5-6 due
to advanced uncovertebral spurring at each of these levels.
2. Moderate central canal stenosis at C4-5 and C5-6 with mild
central canal narrowing at C3-4.
3. Mild right foraminal narrowing at C2-3.
4. Mild left foraminal narrowing at C6-7.

## 2020-03-27 ENCOUNTER — Other Ambulatory Visit: Payer: Self-pay

## 2020-03-27 ENCOUNTER — Ambulatory Visit (INDEPENDENT_AMBULATORY_CARE_PROVIDER_SITE_OTHER): Payer: Managed Care, Other (non HMO)

## 2020-03-27 ENCOUNTER — Ambulatory Visit: Payer: Managed Care, Other (non HMO) | Admitting: Orthopaedic Surgery

## 2020-03-27 ENCOUNTER — Ambulatory Visit: Payer: Self-pay

## 2020-03-27 ENCOUNTER — Encounter: Payer: Self-pay | Admitting: Orthopaedic Surgery

## 2020-03-27 DIAGNOSIS — M5412 Radiculopathy, cervical region: Secondary | ICD-10-CM | POA: Diagnosis not present

## 2020-03-27 DIAGNOSIS — M79641 Pain in right hand: Secondary | ICD-10-CM

## 2020-03-27 DIAGNOSIS — M79642 Pain in left hand: Secondary | ICD-10-CM | POA: Diagnosis not present

## 2020-03-27 NOTE — Progress Notes (Signed)
Office Visit Note   Patient: Taylor Heath           Date of Birth: 05/20/1960           MRN: [id number] Visit Date: 03/27/2020              Requested by: [facility] [address] [place],  [place] [address] PCP: [facility]   Assessment & Plan: Visit Diagnoses:  1. Pain in left hand   2. Pain in right hand   3. Radiculopathy of cervical spine     Plan: Impression is questionable bilateral carpal tunnel/cubital tunnel syndrome versus underlying cervical spine radiculopathy.  At this point, believe it is appropriate to refer him to Dr. Alvester Morin for bilateral upper extremity nerve conduction study/EMG.  We have discussed nighttime splinting, but he would like to wait until after nerve conduction study before trying this.  He will follow up with [place] after the study.  Follow-Up Instructions: Return for after NCS/EMG.   Orders:  Orders Placed This Encounter  Procedures  . XR Hand Complete Left  . XR Hand Complete Right  . XR Cervical Spine 2 or 3 views   No orders of the defined types were placed in this encounter.     Procedures: No procedures performed   Clinical Data: No additional findings.   Subjective: Chief Complaint  Patient presents with  . Neck - Pain  . Right Hand - Pain, Numbness  . Left Hand - Pain, Numbness    HPI patient is a pleasant 60 year old right-hand-dominant man who comes in today with bilateral hand paresthesias left greater than right.  He has had this for months but has recently worsened.  The paresthesias are in the entire aspect of both hands.  He has increased symptoms when he is sleeping with his neck extended.  He often wakes up at night having to shake his hands.  Is increased pain while playing golf as well.  He has been taking diclofenac and ibuprofen without significant relief.  Of note, he does have a history of cervical spine radiculopathy and has had epidural steroid injections in the  past.  No previous nerve conduction study/EMG.  Review of Systems as detailed in HPI.  All others reviewed and are negative.   Objective: Vital Signs: There were no vitals taken for this visit.  Physical Exam well-developed well-nourished gentleman in no acute distress.  Alert and oriented x3.  Ortho Exam bilateral hand exam shows positive Phalen on the left.  Positive Tinel at the wrist and elbow on both sides.  No thenar atrophy.  Full grip strength.  He is neurovascularly intact distally.  Specialty Comments:  No specialty comments available.  Imaging: XR Cervical Spine 2 or 3 views  Result Date: 03/27/2020 Significant multilevel spondylosis worse C3-4, C4-5, C5-6 and C7-T1.  XR Hand Complete Left  Result Date: 03/27/2020 No acute or structural abnormalities  XR Hand Complete Right  Result Date: 03/27/2020 No acute or structural abnormalities    PMFS History: Patient Active Problem List   Diagnosis Date Noted  . Primary osteoarthritis of right wrist 04/26/2017  . Radiculopathy of cervical spine 04/26/2017  . Total knee replacement status 01/27/2017  . Primary osteoarthritis of right shoulder 12/30/2016  . Primary osteoarthritis of right knee 12/30/2016   Past Medical History:  Diagnosis Date  . Anxiety   . Arthritis   . History of kidney stones     History reviewed. No pertinent  family history.  Past Surgical History:  Procedure Laterality Date  . JOINT REPLACEMENT     left hip replaced  . KNEE ARTHROSCOPY W/ ACL RECONSTRUCTION Right 2009  . ORIF FINGER / THUMB FRACTURE    . TOTAL KNEE ARTHROPLASTY Right 01/27/2017   Procedure: RIGHT TOTAL KNEE ARTHROPLASTY;  Surgeon: Tarry Kos, MD;  Location: [facility] OR;  Service: Orthopedics;  Laterality: Right;   Social History   Occupational History  . Not on file  Tobacco Use  . Smoking status: Former Smoker    Types: Cigarettes    Quit date: 04/21/2010    Years since quitting: 9.9  . Smokeless tobacco: Never Used   Vaping Use  . Vaping Use: Never used  Substance and Sexual Activity  . Alcohol use: No  . Drug use: No  . Sexual activity: Not on file

## 2020-05-09 ENCOUNTER — Encounter: Payer: Self-pay | Admitting: Physical Medicine and Rehabilitation

## 2020-05-09 ENCOUNTER — Other Ambulatory Visit: Payer: Self-pay

## 2020-05-09 ENCOUNTER — Ambulatory Visit: Payer: Managed Care, Other (non HMO) | Admitting: Physical Medicine and Rehabilitation

## 2020-05-09 DIAGNOSIS — R202 Paresthesia of skin: Secondary | ICD-10-CM

## 2020-05-09 NOTE — Progress Notes (Signed)
Bilateral wrist and hand pain and numbness. Worse in the morning. Right hand has been worse. Right hand dominant +Lotion

## 2020-05-09 NOTE — Procedures (Signed)
EMG & NCV Findings: Evaluation of the left median motor nerve showed prolonged distal onset latency (5.4 ms) and decreased conduction velocity (Elbow-Wrist, 49 m/s).  The left median (across palm) sensory nerve showed no response (Palm) and prolonged distal peak latency (4.8 ms).  The right median (across palm) sensory nerve showed prolonged distal peak latency (Wrist, 5.2 ms) and prolonged distal peak latency (Palm, 2.3 ms).  All remaining nerves (as indicated in the following tables) were within normal limits.  Left vs. Right side comparison data for the median motor nerve indicates abnormal L-R latency difference (1.3 ms).  All remaining left vs. right side differences were within normal limits.    All examined muscles (as indicated in the following table) showed no evidence of electrical instability.    Impression: The above electrodiagnostic study is ABNORMAL and reveals evidence of:  1, A moderate left median nerve entrapment at the wrist (carpal tunnel syndrome) affecting sensory and motor components.   2. A mild to moderate right median nerve entrapment at the wrist (carpal tunnel syndrome) affecting sensory components.    There is no significant electrodiagnostic evidence of any other focal nerve entrapment, brachial plexopathy or cervical radiculopathy.    Recommendations: 1.  Follow-up with referring physician. 2.  Continue current management of symptoms. 3.  Continue use of resting splint at night-time and as needed during the day. 4.  Suggest surgical evaluation.  ___________________________ Taylor Heath FAAPMR Board Certified, American Board of Physical Medicine and Rehabilitation    Nerve Conduction Studies Anti Sensory Summary Table   Stim Site NR Peak (ms) Norm Peak (ms) P-T Amp (V) Norm P-T Amp Site1 Site2 Delta-P (ms) Dist (cm) Vel (m/s) Norm Vel (m/s)  Left Median Acr Palm Anti Sensory (2nd Digit)  32.4C  Wrist    *4.8 <3.6 11.7 >10 Wrist Palm  0.0    Palm *NR   <2.0          Right Median Acr Palm Anti Sensory (2nd Digit)  32.2C  Wrist    *5.2 <3.6 10.6 >10 Wrist Palm 2.9 0.0    Palm    *2.3 <2.0 3.9         Left Radial Anti Sensory (Base 1st Digit)  32.7C  Wrist    2.0 <3.1 27.8  Wrist Base 1st Digit 2.0 0.0    Right Radial Anti Sensory (Base 1st Digit)  32.2C  Wrist    2.2 <3.1 23.6  Wrist Base 1st Digit 2.2 0.0    Left Ulnar Anti Sensory (5th Digit)  33C  Wrist    3.3 <3.7 17.0 >15.0 Wrist 5th Digit 3.3 14.0 42 >38  Right Ulnar Anti Sensory (5th Digit)  32.5C  Wrist    3.1 <3.7 22.8 >15.0 Wrist 5th Digit 3.1 14.0 45 >38   Motor Summary Table   Stim Site NR Onset (ms) Norm Onset (ms) O-P Amp (mV) Norm O-P Amp Site1 Site2 Delta-0 (ms) Dist (cm) Vel (m/s) Norm Vel (m/s)  Left Median Motor (Abd Poll Brev)  33.3C  Wrist    *5.4 <4.2 9.1 >5 Elbow Wrist 4.7 23.0 *49 >50  Elbow    10.1  8.6         Right Median Motor (Abd Poll Brev)  32.5C  Wrist    4.1 <4.2 8.7 >5 Elbow Wrist 4.3 23.0 53 >50  Elbow    8.4  8.2         Left Ulnar Motor (Abd Dig Min)  33.5C  Wrist  3.0 <4.2 7.8 >3 B Elbow Wrist 3.6 21.0 58 >53  B Elbow    6.6  6.6  A Elbow B Elbow 1.7 10.0 59 >53  A Elbow    8.3  6.1         Right Ulnar Motor (Abd Dig Min)  32.7C  Wrist    2.8 <4.2 9.1 >3 B Elbow Wrist 4.1 23.0 56 >53  B Elbow    6.9  7.8  A Elbow B Elbow 1.5 11.0 73 >53  A Elbow    8.4  6.7          EMG   Side Muscle Nerve Root Ins Act Fibs Psw Amp Dur Poly Recrt Int Dennie Bible Comment  Right 1stDorInt Ulnar C8-T1 Nml Nml Nml Nml Nml 0 Nml Nml   Right Abd Poll Brev Median C8-T1 Nml Nml Nml Nml Nml 0 Nml Nml   Right ExtDigCom   Nml Nml Nml Nml Nml 0 Nml Nml   Right Triceps Radial C6-7-8 Nml Nml Nml Nml Nml 0 Nml Nml   Right Deltoid Axillary C5-6 Nml Nml Nml Nml Nml 0 Nml Nml     Nerve Conduction Studies Anti Sensory Left/Right Comparison   Stim Site L Lat (ms) R Lat (ms) L-R Lat (ms) L Amp (V) R Amp (V) L-R Amp (%) Site1 Site2 L Vel (m/s) R Vel (m/s) L-R Vel (m/s)   Median Acr Palm Anti Sensory (2nd Digit)  32.4C  Wrist *4.8 *5.2 0.4 11.7 10.6 9.4 Wrist Palm     Palm  *2.3   3.9        Radial Anti Sensory (Base 1st Digit)  32.7C  Wrist 2.0 2.2 0.2 27.8 23.6 15.1 Wrist Base 1st Digit     Ulnar Anti Sensory (5th Digit)  33C  Wrist 3.3 3.1 0.2 17.0 22.8 25.4 Wrist 5th Digit 42 45 3   Motor Left/Right Comparison   Stim Site L Lat (ms) R Lat (ms) L-R Lat (ms) L Amp (mV) R Amp (mV) L-R Amp (%) Site1 Site2 L Vel (m/s) R Vel (m/s) L-R Vel (m/s)  Median Motor (Abd Poll Brev)  33.3C  Wrist *5.4 4.1 *1.3 9.1 8.7 4.4 Elbow Wrist *49 53 4  Elbow 10.1 8.4 1.7 8.6 8.2 4.7       Ulnar Motor (Abd Dig Min)  33.5C  Wrist 3.0 2.8 0.2 7.8 9.1 14.3 B Elbow Wrist 58 56 2  B Elbow 6.6 6.9 0.3 6.6 7.8 15.4 A Elbow B Elbow 59 73 14  A Elbow 8.3 8.4 0.1 6.1 6.7 9.0          Waveforms:

## 2020-05-09 NOTE — Progress Notes (Signed)
Taylor Heath - 60 y.o. male MRN [id number]  Date of birth: Jul 19, 1960  Office Visit Note: Visit Date: 05/09/2020 PCP: [facility] by[facility]  Subjective: Chief Complaint  Patient presents with  . Right Wrist - Pain, Numbness  . Left Wrist - Numbness, Pain  . Right Hand - Numbness, Pain  . Left Hand - Numbness, Pain   HPI:  Taylor Heath is a 60 y.o. male who comes in today at the request of Dr. Glee Arvin for electrodiagnostic study of the Bilateral upper extremities.  Patient is Right hand dominant.  He reports chronic worsening bilateral wrist and hand pain with numbness and tingling worse in the morning and some at night.  Positive flick sign.  He has not had prior electrodiagnostic studies.  He has some history of radiculopathy in the cervical spine.  No prior carpal tunnel surgery.  History of thumb fracture in the past with fixation.   ROS Otherwise per HPI.  Assessment & Plan: Visit Diagnoses:    ICD-10-CM   1. Paresthesia of skin  R20.2 NCV with EMG (electromyography)    Plan: Impression: The above electrodiagnostic study is ABNORMAL and reveals evidence of:  1, A moderate left median nerve entrapment at the wrist (carpal tunnel syndrome) affecting sensory and motor components.   2. A mild to moderate right median nerve entrapment at the wrist (carpal tunnel syndrome) affecting sensory components.    There is no significant electrodiagnostic evidence of any other focal nerve entrapment, brachial plexopathy or cervical radiculopathy.    Recommendations: 1.  Follow-up with referring physician. 2.  Continue current management of symptoms. 3.  Continue use of resting splint at night-time and as needed during the day. 4.  Suggest surgical evaluation.   Meds & Orders: No orders of the defined types were placed in this encounter.   Orders Placed This Encounter  Procedures  . NCV with EMG (electromyography)     Follow-up: Return in about 2 weeks (around 05/23/2020) for  Glee Arvin, MD.   Procedures: No procedures performed  EMG & NCV Findings: Evaluation of the left median motor nerve showed prolonged distal onset latency (5.4 ms) and decreased conduction velocity (Elbow-Wrist, 49 m/s).  The left median (across palm) sensory nerve showed no response (Palm) and prolonged distal peak latency (4.8 ms).  The right median (across palm) sensory nerve showed prolonged distal peak latency (Wrist, 5.2 ms) and prolonged distal peak latency (Palm, 2.3 ms).  All remaining nerves (as indicated in the following tables) were within normal limits.  Left vs. Right side comparison data for the median motor nerve indicates abnormal L-R latency difference (1.3 ms).  All remaining left vs. right side differences were within normal limits.    All examined muscles (as indicated in the following table) showed no evidence of electrical instability.    Impression: The above electrodiagnostic study is ABNORMAL and reveals evidence of:  1, A moderate left median nerve entrapment at the wrist (carpal tunnel syndrome) affecting sensory and motor components.   2. A mild to moderate right median nerve entrapment at the wrist (carpal tunnel syndrome) affecting sensory components.    There is no significant electrodiagnostic evidence of any other focal nerve entrapment, brachial plexopathy or cervical radiculopathy.    Recommendations: 1.  Follow-up with referring physician. 2.  Continue current management of symptoms. 3.  Continue use of resting splint at night-time and as needed during the day. 4.  Suggest surgical  evaluation.  ___________________________ Naaman Plummer FAAPMR Board Certified, American Board of Physical Medicine and Rehabilitation    Nerve Conduction Studies Anti Sensory Summary Table   Stim Site NR Peak (ms) Norm Peak (ms) P-T Amp (V) Norm P-T Amp Site1 Site2 Delta-P (ms) Dist (cm) Vel (m/s) Norm Vel  (m/s)  Left Median Acr Palm Anti Sensory (2nd Digit)  32.4C  Wrist    *4.8 <3.6 11.7 >10 Wrist Palm  0.0    Palm *NR  <2.0          Right Median Acr Palm Anti Sensory (2nd Digit)  32.2C  Wrist    *5.2 <3.6 10.6 >10 Wrist Palm 2.9 0.0    Palm    *2.3 <2.0 3.9         Left Radial Anti Sensory (Base 1st Digit)  32.7C  Wrist    2.0 <3.1 27.8  Wrist Base 1st Digit 2.0 0.0    Right Radial Anti Sensory (Base 1st Digit)  32.2C  Wrist    2.2 <3.1 23.6  Wrist Base 1st Digit 2.2 0.0    Left Ulnar Anti Sensory (5th Digit)  33C  Wrist    3.3 <3.7 17.0 >15.0 Wrist 5th Digit 3.3 14.0 42 >38  Right Ulnar Anti Sensory (5th Digit)  32.5C  Wrist    3.1 <3.7 22.8 >15.0 Wrist 5th Digit 3.1 14.0 45 >38   Motor Summary Table   Stim Site NR Onset (ms) Norm Onset (ms) O-P Amp (mV) Norm O-P Amp Site1 Site2 Delta-0 (ms) Dist (cm) Vel (m/s) Norm Vel (m/s)  Left Median Motor (Abd Poll Brev)  33.3C  Wrist    *5.4 <4.2 9.1 >5 Elbow Wrist 4.7 23.0 *49 >50  Elbow    10.1  8.6         Right Median Motor (Abd Poll Brev)  32.5C  Wrist    4.1 <4.2 8.7 >5 Elbow Wrist 4.3 23.0 53 >50  Elbow    8.4  8.2         Left Ulnar Motor (Abd Dig Min)  33.5C  Wrist    3.0 <4.2 7.8 >3 B Elbow Wrist 3.6 21.0 58 >53  B Elbow    6.6  6.6  A Elbow B Elbow 1.7 10.0 59 >53  A Elbow    8.3  6.1         Right Ulnar Motor (Abd Dig Min)  32.7C  Wrist    2.8 <4.2 9.1 >3 B Elbow Wrist 4.1 23.0 56 >53  B Elbow    6.9  7.8  A Elbow B Elbow 1.5 11.0 73 >53  A Elbow    8.4  6.7          EMG   Side Muscle Nerve Root Ins Act Fibs Psw Amp Dur Poly Recrt Int Dennie Bible Comment  Right 1stDorInt Ulnar C8-T1 Nml Nml Nml Nml Nml 0 Nml Nml   Right Abd Poll Brev Median C8-T1 Nml Nml Nml Nml Nml 0 Nml Nml   Right ExtDigCom   Nml Nml Nml Nml Nml 0 Nml Nml   Right Triceps Radial C6-7-8 Nml Nml Nml Nml Nml 0 Nml Nml   Right Deltoid Axillary C5-6 Nml Nml Nml Nml Nml 0 Nml Nml     Nerve Conduction Studies Anti Sensory Left/Right Comparison   Stim  Site L Lat (ms) R Lat (ms) L-R Lat (ms) L Amp (V) R Amp (V) L-R Amp (%) Site1 Site2 L Vel (m/s) R Vel (m/s) L-R Vel (  m/s)  Median Acr Palm Anti Sensory (2nd Digit)  32.4C  Wrist *4.8 *5.2 0.4 11.7 10.6 9.4 Wrist Palm     Palm  *2.3   3.9        Radial Anti Sensory (Base 1st Digit)  32.7C  Wrist 2.0 2.2 0.2 27.8 23.6 15.1 Wrist Base 1st Digit     Ulnar Anti Sensory (5th Digit)  33C  Wrist 3.3 3.1 0.2 17.0 22.8 25.4 Wrist 5th Digit 42 45 3   Motor Left/Right Comparison   Stim Site L Lat (ms) R Lat (ms) L-R Lat (ms) L Amp (mV) R Amp (mV) L-R Amp (%) Site1 Site2 L Vel (m/s) R Vel (m/s) L-R Vel (m/s)  Median Motor (Abd Poll Brev)  33.3C  Wrist *5.4 4.1 *1.3 9.1 8.7 4.4 Elbow Wrist *49 53 4  Elbow 10.1 8.4 1.7 8.6 8.2 4.7       Ulnar Motor (Abd Dig Min)  33.5C  Wrist 3.0 2.8 0.2 7.8 9.1 14.3 B Elbow Wrist 58 56 2  B Elbow 6.6 6.9 0.3 6.6 7.8 15.4 A Elbow B Elbow 59 73 14  A Elbow 8.3 8.4 0.1 6.1 6.7 9.0          Waveforms:                      Clinical History: No specialty comments available.     Objective:  VS:  HT:    WT:   BMI:     BP:   HR: bpm  TEMP: ( )  RESP:  Physical Exam Musculoskeletal:        General: No tenderness.     Comments: Inspection reveals no atrophy of the bilateral APB or FDI or hand intrinsics. There is no swelling, color changes, allodynia or dystrophic changes. There is 5 out of 5 strength in the bilateral wrist extension, finger abduction and long finger flexion. There is intact sensation to light touch in all dermatomal and peripheral nerve distributions. There is a negative Hoffmann's test bilaterally.  Skin:    General: Skin is warm and dry.     Findings: No erythema or rash.  Neurological:     General: No focal deficit present.     Mental Status: He is alert and oriented to person, place, and time.     Sensory: No sensory deficit.     Motor: No weakness or abnormal muscle tone.     Coordination: Coordination normal.     Gait:  Gait normal.  Psychiatric:        Mood and Affect: Mood normal.        Behavior: Behavior normal.        Thought Content: Thought content normal.      Imaging: No results found.

## 2020-05-21 ENCOUNTER — Ambulatory Visit: Payer: Managed Care, Other (non HMO) | Admitting: Orthopaedic Surgery

## 2020-05-21 ENCOUNTER — Encounter: Payer: Self-pay | Admitting: Orthopaedic Surgery

## 2020-05-21 DIAGNOSIS — G5603 Carpal tunnel syndrome, bilateral upper limbs: Secondary | ICD-10-CM

## 2020-05-21 MED ORDER — LIDOCAINE HCL 1 % IJ SOLN
1.0000 mL | INTRAMUSCULAR | Status: AC | PRN
  Administered 2020-05-21: 1 mL

## 2020-05-21 MED ORDER — METHYLPREDNISOLONE ACETATE 40 MG/ML IJ SUSP
40.0000 mg | INTRAMUSCULAR | Status: AC | PRN
  Administered 2020-05-21: 40 mg

## 2020-05-21 MED ORDER — BUPIVACAINE HCL 0.25 % IJ SOLN
0.3300 mL | INTRAMUSCULAR | Status: AC | PRN
Start: 2020-05-21 — End: 2020-05-21
  Administered 2020-05-21: .33 mL

## 2020-05-21 NOTE — Progress Notes (Signed)
Office Visit Note   Patient: Taylor Heath           Date of Birth: 1960-07-03           MRN: [id number] Visit Date: 05/21/2020              Requested by: Taylor Heath [id number] [place],  [place] [address] PCP: Taylor Heath   Assessment & Plan: Visit Diagnoses:  1. Carpal tunnel syndrome, bilateral     Plan: Impression is bilateral carpal tunnel syndrome moderate on the left mild to moderate on the right.  Today, we discussed cortisone injections to both carpal tunnels which she would like to proceed.  We will follow up with [place] as needed.  Follow-Up Instructions: Return if symptoms worsen or fail to improve.   Orders:  Orders Placed This Encounter  Procedures  . Hand/UE Inj: bilateral carpal tunnel   No orders of the defined types were placed in this encounter.     Procedures: Hand/UE Inj: bilateral carpal tunnel for carpal tunnel syndrome on 05/21/2020 10:45 AM Indications: pain Details: 25 G needle, volar approach Medications (Right): 1 mL lidocaine 1 %; 40 mg methylPREDNISolone acetate 40 MG/ML; 0.33 mL bupivacaine 0.25 % Medications (Left): 40 mg methylPREDNISolone acetate 40 MG/ML; 1 mL lidocaine 1 %; 0.33 mL bupivacaine 0.25 %      Clinical Data: No additional findings.   Subjective: Chief Complaint  Patient presents with  . Right Hand - Pain  . Left Hand - Pain    HPI patient is a very pleasant 60 year old gentleman who comes in today to discuss nerve conduction studies bilateral upper extremities.  Recent nerve conduction study by Dr. Alvester Heath showed a moderate median nerve compression on the left and a mild to moderate median nerve compression on the right.  Taylor Heath has not had previous cortisone injections to either carpal tunnel.  He continues to have symptoms left greater than right.  Review of Systems as detailed in HPI.  All others reviewed and are negative.   Objective: Vital Signs: There were  no vitals taken for this visit.  Physical Exam well-developed and well-nourished gentleman in no acute distress.  Alert oriented x3.  Ortho Exam unchanged bilateral upper extremity exam  Specialty Comments:  No specialty comments available.  Imaging: No new imaging   PMFS History: Patient Active Problem List   Diagnosis Date Noted  . Primary osteoarthritis of right wrist 04/26/2017  . Radiculopathy of cervical spine 04/26/2017  . Total knee replacement status 01/27/2017  . Primary osteoarthritis of right shoulder 12/30/2016  . Primary osteoarthritis of right knee 12/30/2016   Past Medical History:  Diagnosis Date  . Anxiety   . Arthritis   . History of kidney stones     History reviewed. No pertinent family history.  Past Surgical History:  Procedure Laterality Date  . JOINT REPLACEMENT     left hip replaced  . KNEE ARTHROSCOPY W/ ACL RECONSTRUCTION Right 2009  . ORIF FINGER / THUMB FRACTURE    . TOTAL KNEE ARTHROPLASTY Right 01/27/2017   Procedure: RIGHT TOTAL KNEE ARTHROPLASTY;  Surgeon: Taylor Kos, MD;  Location: [facility] OR;  Service: Orthopedics;  Laterality: Right;   Social History   Occupational History  . Not on file  Tobacco Use  . Smoking status: Former Smoker    Types: Cigarettes    Quit date: 04/21/2010    Years since quitting: 10.0  . Smokeless tobacco: Never Used  Vaping Use  . Vaping Use: Never used  Substance and Sexual Activity  . Alcohol use: No  . Drug use: No  . Sexual activity: Not on file

## 2020-11-19 ENCOUNTER — Ambulatory Visit: Payer: Managed Care, Other (non HMO) | Admitting: Orthopaedic Surgery

## 2020-11-19 ENCOUNTER — Other Ambulatory Visit: Payer: Self-pay

## 2020-11-19 DIAGNOSIS — G5601 Carpal tunnel syndrome, right upper limb: Secondary | ICD-10-CM | POA: Diagnosis not present

## 2020-11-19 MED ORDER — BUPIVACAINE HCL 0.5 % IJ SOLN
1.0000 mL | INTRAMUSCULAR | Status: AC | PRN
  Administered 2020-11-19: 1 mL

## 2020-11-19 MED ORDER — LIDOCAINE HCL 1 % IJ SOLN
1.0000 mL | INTRAMUSCULAR | Status: AC | PRN
  Administered 2020-11-19: 1 mL

## 2020-11-19 MED ORDER — METHYLPREDNISOLONE ACETATE 40 MG/ML IJ SUSP
40.0000 mg | INTRAMUSCULAR | Status: AC | PRN
  Administered 2020-11-19: 40 mg

## 2020-11-19 NOTE — Progress Notes (Signed)
Office Visit Note   Patient: Taylor Heath           Date of Birth: 05-09-1960           MRN: [id number] Visit Date: 11/19/2020              Requested by: [facility] [address] [place],  [place] [address] PCP: [facility]   Assessment & Plan: Visit Diagnoses:  1. Carpal tunnel syndrome, right upper limb     Plan: Impression is recurrent right carpal tunnel syndrome.  Today, we discussed repeat cortisone injection for which she would like to proceed.  Should his symptoms on the left return, he will follow-up for repeat cortisone injection.  Call with concerns or questions in the meantime.  Follow-Up Instructions: Return if symptoms worsen or fail to improve.   Orders:  No orders of the defined types were placed in this encounter.  No orders of the defined types were placed in this encounter.     Procedures: Hand/UE Inj: R carpal tunnel for carpal tunnel syndrome on 11/19/2020 5:18 PM Indications: pain Details: 25 G needle Medications: 1 mL lidocaine 1 %; 1 mL bupivacaine 0.5 %; 40 mg methylPREDNISolone acetate 40 MG/ML Outcome: tolerated well, no immediate complications Patient was prepped and draped in the usual sterile fashion.      Clinical Data: No additional findings.   Subjective: Chief Complaint  Patient presents with  . Right Wrist - Follow-up  . Left Wrist - Follow-up    HPI patient is a pleasant 60 year old right-hand-dominant gentleman who comes in today with recurrent pain and paresthesias to the right hand.  He was seen in our office in March of this past year following nerve conduction studies bilateral upper extremities.  Nerve conduction studies showed moderate compression median nerve on the left and mild to moderate compression of the median nerve on the right.  Bilateral carpal tunnel injections were performed.  He had significant relief following the injections.  He is still doing well on the  left but about 3 to 4 weeks ago noticed return of symptoms on the right.  Symptoms appear to be worse at night and when he is playing golf.  He is requesting repeat injection to the right carpal tunnel today.  Review of Systems as detailed in HPI.  All others reviewed and are negative.   Objective: Vital Signs: There were no vitals taken for this visit.  Physical Exam well-developed well-nourished gentleman in no acute distress.  Alert and oriented x3.  Ortho Exam right hand exam reveals a positive Phalen and positive Tinel.  No thenar atrophy.  He is neurovascular tact distally.  Specialty Comments:  No specialty comments available.  Imaging: No new imaging   PMFS History: Patient Active Problem List   Diagnosis Date Noted  . Primary osteoarthritis of right wrist 04/26/2017  . Radiculopathy of cervical spine 04/26/2017  . Total knee replacement status 01/27/2017  . Primary osteoarthritis of right shoulder 12/30/2016  . Primary osteoarthritis of right knee 12/30/2016   Past Medical History:  Diagnosis Date  . Anxiety   . Arthritis   . History of kidney stones     No family history on file.  Past Surgical History:  Procedure Laterality Date  . JOINT REPLACEMENT     left hip replaced  . KNEE ARTHROSCOPY W/ ACL RECONSTRUCTION Right 2009  . ORIF FINGER / THUMB FRACTURE    . TOTAL KNEE ARTHROPLASTY Right  01/27/2017   Procedure: RIGHT TOTAL KNEE ARTHROPLASTY;  Surgeon: Tarry Kos, MD;  Location: [facility] OR;  Service: Orthopedics;  Laterality: Right;   Social History   Occupational History  . Not on file  Tobacco Use  . Smoking status: Former    Types: Cigarettes    Quit date: 04/21/2010    Years since quitting: 10.5  . Smokeless tobacco: Never  Vaping Use  . Vaping Use: Never used  Substance and Sexual Activity  . Alcohol use: No  . Drug use: No  . Sexual activity: Not on file

## 2021-03-19 ENCOUNTER — Encounter: Payer: Self-pay | Admitting: Orthopaedic Surgery

## 2021-03-19 ENCOUNTER — Other Ambulatory Visit: Payer: Self-pay

## 2021-03-19 ENCOUNTER — Ambulatory Visit: Payer: Managed Care, Other (non HMO) | Admitting: Orthopaedic Surgery

## 2021-03-19 DIAGNOSIS — G5601 Carpal tunnel syndrome, right upper limb: Secondary | ICD-10-CM | POA: Diagnosis not present

## 2021-03-19 NOTE — Progress Notes (Signed)
Office Visit Note   Patient: Taylor Heath           Date of Birth: 07-Aug-1960           MRN: [id number] Visit Date: 03/19/2021              Requested by: [name], [name],  [name] PCP: [name]   Assessment & Plan: Visit Diagnoses:  1. Carpal tunnel syndrome, right upper limb     Plan: Impression is right carpal tunnel syndrome mild to moderate in severity.  Patient would like repeat cortisone injection today for which I am agreeable to.  He will follow-up with [place] as needed.  Follow-Up Instructions: Return if symptoms worsen or fail to improve.   Orders:  Orders Placed This Encounter  Procedures   Hand/UE Inj: R carpal tunnel   No orders of the defined types were placed in this encounter.     Procedures: Hand/UE Inj: R carpal tunnel for carpal tunnel syndrome on 03/19/2021 10:50 AM Indications: pain Details: 25 G needle, volar approach Medications: 1 mL lidocaine 1 %; 1 mL bupivacaine 0.25 %; 1 mg methylPREDNISolone acetate 40 MG/ML     Clinical Data: No additional findings.   Subjective: Chief Complaint  Patient presents with   Right Wrist - Pain    HPI patient is a pleasant 61 year old gentleman who comes in today with recurrent paresthesias to the right hand.  History of mild to moderate carpal tunnel syndrome diagnosed on nerve conduction study back in March 2022.  He has had previous injections to the right carpal tunnel with the last ones being in March and then in September 2020.  He has had great relief following the injections.  He is requesting repeat injection to the right today.  Review of Systems as detailed in HPI.  All others reviewed and are negative.   Objective: Vital Signs: There were no vitals taken for this visit.  Physical Exam well-developed well-nourished gentleman no acute distress.  Alert and oriented x3.  Ortho Exam right wrist shows a positive  Phalen and positive Tinel.  No thenar atrophy.  He is neurovascular intact distally.  Specialty Comments:  No specialty comments available.  Imaging: No new imaging   PMFS History: Patient Active Problem List   Diagnosis Date Noted   Primary osteoarthritis of right wrist 04/26/2017   Radiculopathy of cervical spine 04/26/2017   Total knee replacement status 01/27/2017   Primary osteoarthritis of right shoulder 12/30/2016   Primary osteoarthritis of right knee 12/30/2016   Past Medical History:  Diagnosis Date   Anxiety    Arthritis    History of kidney stones     History reviewed. No pertinent family history.  Past Surgical History:  Procedure Laterality Date   JOINT REPLACEMENT     left hip replaced   KNEE ARTHROSCOPY W/ ACL RECONSTRUCTION Right 2009   ORIF FINGER / THUMB FRACTURE     TOTAL KNEE ARTHROPLASTY Right 01/27/2017   Procedure: RIGHT TOTAL KNEE ARTHROPLASTY;  Surgeon: Leandrew Koyanagi, MD;  Location: [facility];  Service: Orthopedics;  Laterality: Right;   Social History   Occupational History   Not on file  Tobacco Use   Smoking status: Former    Types: Cigarettes    Quit date: 04/21/2010    Years since quitting: 10.9   Smokeless tobacco: Never  Vaping Use   Vaping Use: Never used  Substance and Sexual Activity  Alcohol use: No   Drug use: No   Sexual activity: Not on file

## 2021-03-21 MED ORDER — METHYLPREDNISOLONE ACETATE 40 MG/ML IJ SUSP
1.0000 mg | INTRAMUSCULAR | Status: AC | PRN
  Administered 2021-03-19: 1 mg

## 2021-03-21 MED ORDER — BUPIVACAINE HCL 0.25 % IJ SOLN
1.0000 mL | INTRAMUSCULAR | Status: AC | PRN
  Administered 2021-03-19: 1 mL

## 2021-03-21 MED ORDER — LIDOCAINE HCL 1 % IJ SOLN
1.0000 mL | INTRAMUSCULAR | Status: AC | PRN
  Administered 2021-03-19: 1 mL

## 2021-08-18 ENCOUNTER — Other Ambulatory Visit: Payer: Self-pay

## 2021-08-24 ENCOUNTER — Encounter: Payer: Self-pay | Admitting: *Deleted

## 2021-09-09 ENCOUNTER — Encounter: Payer: Self-pay | Admitting: Urology

## 2021-09-09 ENCOUNTER — Ambulatory Visit: Payer: Managed Care, Other (non HMO) | Admitting: Urology

## 2021-09-09 VITALS — BP 119/72 | HR 71

## 2021-09-09 DIAGNOSIS — R972 Elevated prostate specific antigen [PSA]: Secondary | ICD-10-CM | POA: Diagnosis not present

## 2021-09-09 LAB — URINALYSIS, ROUTINE W REFLEX MICROSCOPIC
Bilirubin, UA: NEGATIVE
Glucose, UA: NEGATIVE
Ketones, UA: NEGATIVE
Leukocytes,UA: NEGATIVE
Nitrite, UA: NEGATIVE
Protein,UA: NEGATIVE
RBC, UA: NEGATIVE
Specific Gravity, UA: 1.02 (ref 1.005–1.030)
Urobilinogen, Ur: 0.2 mg/dL (ref 0.2–1.0)
pH, UA: 6.5 (ref 5.0–7.5)

## 2021-09-09 NOTE — Patient Instructions (Signed)

## 2021-09-09 NOTE — Progress Notes (Signed)
09/09/2021 3:41 PM   Taylor Heath Feb 16, 1961 [id number]  Referring provider: [facility] [address] [place],  [place] [address]  Elevated PSA   HPI: Taylor Heath is a 61yo here for evaluation of elevated PSA. His PSA was 4.5 in 07/2021. His PSa last year was normal per patient. IPSS 4 QOL 0. Urine stream strong. No dysuria. He has occasional post void dribbling.  No hx of UTI or prostatitis. No family history of prostate cancer.    PMH: Past Medical History:  Diagnosis Date   Anxiety    Arthritis    History of kidney stones     Surgical History: Past Surgical History:  Procedure Laterality Date   JOINT REPLACEMENT     left hip replaced   KNEE ARTHROSCOPY W/ ACL RECONSTRUCTION Right 2009   ORIF FINGER / THUMB FRACTURE     TOTAL KNEE ARTHROPLASTY Right 01/27/2017   Procedure: RIGHT TOTAL KNEE ARTHROPLASTY;  Surgeon: Tarry Kos, MD;  Location: [facility];  Service: Orthopedics;  Laterality: Right;    Home Medications:  Allergies as of 09/09/2021       Reactions   Meperidine    UNSPECIFIED REACTION    Meperidine Hcl    Other reaction(s): Other unknown        Medication List        Accurate as of September 09, 2021  3:41 PM. If you have any questions, ask your nurse or doctor.          amoxicillin 500 MG capsule Commonly known as: AMOXIL Take 4 tabs one hour prior to dental procedure   aspirin EC 325 MG tablet Take 1 tablet (325 mg total) by mouth 2 (two) times daily.   FISH OIL PO Take 1 capsule daily by mouth.   ibuprofen 200 MG tablet Commonly known as: ADVIL Take 400-600 mg every 6 (six) hours as needed by mouth for headache or moderate pain.   MULTIVITAMIN PO Take 1 tablet daily by mouth.   omeprazole 20 MG tablet Commonly known as: PRILOSEC OTC Take 20 mg daily by mouth.   ondansetron 4 MG tablet Commonly known as: ZOFRAN Take 1-2 tablets (4-8 mg total) by mouth every 8 (eight) hours as needed for nausea or  vomiting.   oxyCODONE 10 mg 12 hr tablet Commonly known as: OXYCONTIN Take 1 tablet (10 mg total) by mouth every 12 (twelve) hours.   oxyCODONE-acetaminophen 5-325 MG tablet Commonly known as: Percocet Take 1 tablet by mouth every 6 (six) hours as needed for severe pain.   pravastatin 40 MG tablet Commonly known as: PRAVACHOL Take 40 mg daily by mouth.   predniSONE 10 MG (21) Tbpk tablet Commonly known as: STERAPRED UNI-PAK 21 TAB Take as directed   promethazine 25 MG tablet Commonly known as: PHENERGAN Take 1 tablet (25 mg total) by mouth every 6 (six) hours as needed for nausea.   senna-docusate 8.6-50 MG tablet Commonly known as: Senokot S Take 1 tablet by mouth at bedtime as needed.   tiZANidine 4 MG tablet Commonly known as: ZANAFLEX TAKE 1 TABLET (4 MG TOTAL) BY MOUTH EVERY 6 (SIX) HOURS AS NEEDED FOR MUSCLE SPASMS.   valACYclovir 500 MG tablet Commonly known as: VALTREX Take 500 mg daily by mouth.        Allergies:  Allergies  Allergen Reactions   Meperidine     UNSPECIFIED REACTION    Meperidine Hcl     Other reaction(s): Other unknown  Family History: No family history on file.  Social History:  reports that he quit smoking about 11 years ago. His smoking use included cigarettes. He has never used smokeless tobacco. He reports that he does not drink alcohol and does not use drugs.  ROS: All other review of systems were reviewed and are negative except what is noted above in HPI  Physical Exam: BP 119/72   Pulse 71   Constitutional:  Alert and oriented, No acute distress. HEENT: [place] AT, moist mucus membranes.  Trachea midline, no masses. Cardiovascular: No clubbing, cyanosis, or edema. Respiratory: Normal respiratory effort, no increased work of breathing. GI: Abdomen is soft, nontender, nondistended, no abdominal masses GU: No CVA tenderness. Circumcised phallus. No masses/lesions on penis, testis, scrotum. Prostate 50g smooth no nodules no  induration.  Lymph: No cervical or inguinal lymphadenopathy. Skin: No rashes, bruises or suspicious lesions. Neurologic: Grossly intact, no focal deficits, moving all 4 extremities. Psychiatric: Normal mood and affect.  Laboratory Data: Lab Results  Component Value Date   WBC 8.5 01/28/2017   HGB 12.4 (L) 01/28/2017   HCT 36.8 (L) 01/28/2017   MCV 91.5 01/28/2017   PLT 248 01/28/2017    Lab Results  Component Value Date   CREATININE 0.85 01/28/2017    No results found for: "PSA"  No results found for: "TESTOSTERONE"  No results found for: "HGBA1C"  Urinalysis    Component Value Date/Time   COLORURINE STRAW (A) 01/27/2017 1038   APPEARANCEUR CLEAR 01/27/2017 1038   LABSPEC 1.008 01/27/2017 1038   PHURINE 6.0 01/27/2017 1038   GLUCOSEU NEGATIVE 01/27/2017 1038   HGBUR MODERATE (A) 01/27/2017 1038   BILIRUBINUR NEGATIVE 01/27/2017 1038   KETONESUR NEGATIVE 01/27/2017 1038   PROTEINUR NEGATIVE 01/27/2017 1038   NITRITE NEGATIVE 01/27/2017 1038   LEUKOCYTESUR NEGATIVE 01/27/2017 1038    Lab Results  Component Value Date   BACTERIA NONE SEEN 01/27/2017    Pertinent Imaging:  No results found for this or any previous visit.  No results found for this or any previous visit.  No results found for this or any previous visit.  No results found for this or any previous visit.  No results found for this or any previous visit.  No results found for this or any previous visit.  No results found for this or any previous visit.  No results found for this or any previous visit.   Assessment & Plan:    1. Elevated PSA The patient and I talked about etiologies of elevated PSA.  We discussed the possible relationship between elevated PSA and prostate cancer, BPH, prostatitis, infection trauma and recent ejaculations.  I recommended that we follow-up with a repeat PSA in 6 months versus prostate biopsy. The patient elects for PSA surveillance.  If it remains elevated  with a positive rising trend we will discuss prostate biopsy at his follow-up appointment.  - Urinalysis, Routine w reflex microscopic   No follow-ups on file.  Wilkie Aye, MD  [facility] [place]

## 2021-11-17 ENCOUNTER — Ambulatory Visit: Payer: Managed Care, Other (non HMO) | Admitting: Orthopaedic Surgery

## 2021-11-17 ENCOUNTER — Encounter: Payer: Self-pay | Admitting: Orthopaedic Surgery

## 2021-11-17 DIAGNOSIS — M25432 Effusion, left wrist: Secondary | ICD-10-CM

## 2021-11-17 DIAGNOSIS — G5601 Carpal tunnel syndrome, right upper limb: Secondary | ICD-10-CM | POA: Diagnosis not present

## 2021-11-17 DIAGNOSIS — M25532 Pain in left wrist: Secondary | ICD-10-CM

## 2021-11-17 DIAGNOSIS — G5602 Carpal tunnel syndrome, left upper limb: Secondary | ICD-10-CM | POA: Diagnosis not present

## 2021-11-17 MED ORDER — LIDOCAINE HCL 1 % IJ SOLN
1.0000 mL | INTRAMUSCULAR | Status: AC | PRN
  Administered 2021-11-17: 1 mL

## 2021-11-17 MED ORDER — METHYLPREDNISOLONE ACETATE 40 MG/ML IJ SUSP
40.0000 mg | INTRAMUSCULAR | Status: AC | PRN
  Administered 2021-11-17: 40 mg

## 2021-11-17 MED ORDER — BUPIVACAINE HCL 0.5 % IJ SOLN
1.0000 mL | INTRAMUSCULAR | Status: AC | PRN
  Administered 2021-11-17: 1 mL

## 2021-11-17 NOTE — Progress Notes (Signed)
Office Visit Note   Patient: Taylor Heath           Date of Birth: May 06, 1960           MRN: [id number] Visit Date: 11/17/2021              Requested by: [place], [place] [address] [place],  [place] [address] PCP: [facility]   Assessment & Plan: Visit Diagnoses:  1. Carpal tunnel syndrome, right upper limb   2. Left carpal tunnel syndrome   3. Pain and swelling of left wrist     Plan: Impression is left carpal tunnel syndrome and left wrist extensor tenosynovitis questionable rheumatoid arthritis.  We did perform a carpal tunnel injection in his left wrist today.  We will need an MRI of the left wrist to evaluate extent of tenosynovitis and to rule out any impending tendon ruptures.  Follow-up after the MRI.  He is to avoid doing any full swings for golf.  Follow-Up Instructions: No follow-ups on file.   Orders:  Orders Placed This Encounter  Procedures   MR Wrist Left w/o contrast   Ambulatory referral to Rheumatology   No orders of the defined types were placed in this encounter.     Procedures: Hand/UE Inj: L carpal tunnel for carpal tunnel syndrome on 11/17/2021 5:13 PM Details: 25 G needle Medications: 1 mL lidocaine 1 %; 1 mL bupivacaine 0.5 %; 40 mg methylPREDNISolone acetate 40 MG/ML Outcome: tolerated well, no immediate complications Patient was prepped and draped in the usual sterile fashion.       Clinical Data: No additional findings.   Subjective: Chief Complaint  Patient presents with   Left Wrist - Pain    HPI Taylor Heath returns today for follow-up of bilateral carpal tunnel syndrome worse on the left.  He has had 2 injections in the right carpal tunnel this last couple years.  His symptoms have gotten worse in the left hand.  He had nerve conduction studies which showed moderate carpal tunnel syndrome.  He is requesting injections today.  He is also noticed swelling to the dorsum of his hand  and wrist.  He has a questionable diagnosis of rheumatoid arthritis.  His father had rheumatoid arthritis.  Review of Systems   Objective: Vital Signs: There were no vitals taken for this visit.  Physical Exam  Ortho Exam Examination of the left wrist shows dorsal swelling and soft tissue crepitus with wrist and finger motion.  No signs of infection.  There is slight tenderness to palpation to this swollen area. Specialty Comments:  No specialty comments available.  Imaging: No results found.   PMFS History: Patient Active Problem List   Diagnosis Date Noted   Primary osteoarthritis of right wrist 04/26/2017   Radiculopathy of cervical spine 04/26/2017   Total knee replacement status 01/27/2017   Primary osteoarthritis of right shoulder 12/30/2016   Primary osteoarthritis of right knee 12/30/2016   Past Medical History:  Diagnosis Date   Anxiety    Arthritis    History of kidney stones     No family history on file.  Past Surgical History:  Procedure Laterality Date   JOINT REPLACEMENT     left hip replaced   KNEE ARTHROSCOPY W/ ACL RECONSTRUCTION Right 2009   ORIF FINGER / THUMB FRACTURE     TOTAL KNEE ARTHROPLASTY Right 01/27/2017   Procedure: RIGHT TOTAL KNEE ARTHROPLASTY;  Surgeon: Leandrew Koyanagi, MD;  Location: [facility]  OR;  Service: Orthopedics;  Laterality: Right;   Social History   Occupational History   Not on file  Tobacco Use   Smoking status: Former    Types: Cigarettes    Quit date: 04/21/2010    Years since quitting: 11.5   Smokeless tobacco: Never  Vaping Use   Vaping Use: Never used  Substance and Sexual Activity   Alcohol use: No   Drug use: No   Sexual activity: Not on file

## 2021-11-26 ENCOUNTER — Ambulatory Visit
Admission: RE | Admit: 2021-11-26 | Discharge: 2021-11-26 | Disposition: A | Discharge status: Home / Self Care | Payer: Managed Care, Other (non HMO) | Source: Ambulatory Visit | Attending: Orthopaedic Surgery | Admitting: Orthopaedic Surgery

## 2021-11-26 DIAGNOSIS — G5602 Carpal tunnel syndrome, left upper limb: Secondary | ICD-10-CM

## 2021-12-01 ENCOUNTER — Ambulatory Visit: Payer: Managed Care, Other (non HMO) | Admitting: Orthopaedic Surgery

## 2021-12-01 DIAGNOSIS — M65832 Other synovitis and tenosynovitis, left forearm: Secondary | ICD-10-CM | POA: Diagnosis not present

## 2021-12-01 DIAGNOSIS — M65932 Unspecified synovitis and tenosynovitis, left forearm: Secondary | ICD-10-CM | POA: Insufficient documentation

## 2021-12-01 NOTE — Progress Notes (Signed)
   Office Visit Note   Patient: ADREAN HEITZ           Date of Birth: 1960-12-15           MRN: [id number] Visit Date: 12/01/2021              Requested by: [place], [facility] [address] [place],  [place] [address] PCP: [facility]   Assessment & Plan: Visit Diagnoses:  1. Extensor tenosynovitis of left wrist     Plan: MRI shows extensive tenosynovitis within the fourth compartment with severe interstitial tearing and tendinopathy of the tendons.  Patient has a diagnosis of rheumatoid arthritis.  Given these findings I recommended tenolysis and repair of the extensor tendons as indicated soon as possible due to risk of tendon rupture.  Jackelyn Poling will call the patient to schedule surgery.  Follow-Up Instructions: No follow-ups on file.   Orders:  No orders of the defined types were placed in this encounter.  No orders of the defined types were placed in this encounter.     Procedures: No procedures performed   Clinical Data: No additional findings.   Subjective: Chief Complaint  Patient presents with   Left Wrist - Pain    HPI Mr. Bosher returns today for left wrist MRI review. Review of Systems   Objective: Vital Signs: There were no vitals taken for this visit.  Physical Exam  Ortho Exam Exam is unchanged. Specialty Comments:  No specialty comments available.  Imaging: No results found.   PMFS History: Patient Active Problem List   Diagnosis Date Noted   Extensor tenosynovitis of left wrist 12/01/2021   Primary osteoarthritis of right wrist 04/26/2017   Radiculopathy of cervical spine 04/26/2017   Total knee replacement status 01/27/2017   Primary osteoarthritis of right shoulder 12/30/2016   Primary osteoarthritis of right knee 12/30/2016   Past Medical History:  Diagnosis Date   Anxiety    Arthritis    History of kidney stones     No family history on file.  Past Surgical History:  Procedure  Laterality Date   JOINT REPLACEMENT     left hip replaced   KNEE ARTHROSCOPY W/ ACL RECONSTRUCTION Right 2009   ORIF FINGER / THUMB FRACTURE     TOTAL KNEE ARTHROPLASTY Right 01/27/2017   Procedure: RIGHT TOTAL KNEE ARTHROPLASTY;  Surgeon: Leandrew Koyanagi, MD;  Location: [facility];  Service: Orthopedics;  Laterality: Right;   Social History   Occupational History   Not on file  Tobacco Use   Smoking status: Former    Types: Cigarettes    Quit date: 04/21/2010    Years since quitting: 11.6   Smokeless tobacco: Never  Vaping Use   Vaping Use: Never used  Substance and Sexual Activity   Alcohol use: No   Drug use: No   Sexual activity: Not on file

## 2021-12-14 ENCOUNTER — Other Ambulatory Visit: Payer: Self-pay | Admitting: Physician Assistant

## 2021-12-14 MED ORDER — ONDANSETRON HCL 4 MG PO TABS: 4.0000 mg | ORAL_TABLET | Freq: Three times a day (TID) | ORAL | 0 refills | Status: DC | PRN

## 2021-12-14 MED ORDER — HYDROCODONE-ACETAMINOPHEN 5-325 MG PO TABS: 1.0000 | ORAL_TABLET | Freq: Three times a day (TID) | ORAL | 0 refills | Status: DC | PRN

## 2021-12-17 ENCOUNTER — Other Ambulatory Visit: Payer: Self-pay

## 2021-12-17 ENCOUNTER — Telehealth: Payer: Self-pay | Admitting: Orthopaedic Surgery

## 2021-12-17 DIAGNOSIS — G5603 Carpal tunnel syndrome, bilateral upper limbs: Secondary | ICD-10-CM

## 2021-12-17 DIAGNOSIS — M65832 Other synovitis and tenosynovitis, left forearm: Secondary | ICD-10-CM

## 2021-12-17 NOTE — Telephone Encounter (Signed)
Please refer to Coralie Keens for hernia.  Thank you

## 2021-12-17 NOTE — Telephone Encounter (Signed)
Referral entered  

## 2021-12-17 NOTE — Telephone Encounter (Signed)
Received call from Patients wife Barnett Applebaum advised patient will need a referral to Coralie Keens MD. The number to contact Barnett Applebaum is [phone]

## 2021-12-24 ENCOUNTER — Ambulatory Visit (INDEPENDENT_AMBULATORY_CARE_PROVIDER_SITE_OTHER): Payer: Managed Care, Other (non HMO) | Admitting: Orthopaedic Surgery

## 2021-12-24 ENCOUNTER — Encounter: Payer: Self-pay | Admitting: Orthopaedic Surgery

## 2021-12-24 DIAGNOSIS — M65832 Other synovitis and tenosynovitis, left forearm: Secondary | ICD-10-CM

## 2021-12-24 NOTE — Progress Notes (Signed)
   Post-Op Visit Note   Patient: Taylor Heath           Date of Birth: 10/09/1960           MRN: [id number] Visit Date: 12/24/2021 PCP: Pcp, No   Assessment & Plan:  Chief Complaint:  Chief Complaint  Patient presents with   Left Wrist - Follow-up    Left wrist extensor tenolysis 12/17/2021   Visit Diagnoses:  1. Extensor tenosynovitis of left wrist     Plan: Calogero is 1 week status post Tina synovectomy left wrist extensor tendons.  He is here for a wound check and bandage change.  Has no real complaints.  Examination of the left wrist shows intact vertical incision.  No drainage.  Mild postoperative swelling.  Extensor function intact.  Operative findings were reviewed with the patient.  Continue activity restrictions.  No golf for total of 6 weeks.  2 Band-Aids were applied over the incision and he was placed in a Velcro wrist brace to be used during hand function and lifting.  Recheck next week for suture removal.  Follow-Up Instructions: Return in about 1 week (around 12/31/2021) for suture removal.   Orders:  No orders of the defined types were placed in this encounter.  No orders of the defined types were placed in this encounter.   Imaging: No results found.  PMFS History: Patient Active Problem List   Diagnosis Date Noted   Extensor tenosynovitis of left wrist 12/01/2021   Primary osteoarthritis of right wrist 04/26/2017   Radiculopathy of cervical spine 04/26/2017   Total knee replacement status 01/27/2017   Primary osteoarthritis of right shoulder 12/30/2016   Primary osteoarthritis of right knee 12/30/2016   Past Medical History:  Diagnosis Date   Anxiety    Arthritis    History of kidney stones     No family history on file.  Past Surgical History:  Procedure Laterality Date   JOINT REPLACEMENT     left hip replaced   KNEE ARTHROSCOPY W/ ACL RECONSTRUCTION Right 2009   ORIF FINGER / THUMB FRACTURE     TOTAL KNEE ARTHROPLASTY Right 01/27/2017    Procedure: RIGHT TOTAL KNEE ARTHROPLASTY;  Surgeon: Leandrew Koyanagi, MD;  Location: [facility];  Service: Orthopedics;  Laterality: Right;   Social History   Occupational History   Not on file  Tobacco Use   Smoking status: Former    Types: Cigarettes    Quit date: 04/21/2010    Years since quitting: 11.6   Smokeless tobacco: Never  Vaping Use   Vaping Use: Never used  Substance and Sexual Activity   Alcohol use: No   Drug use: No   Sexual activity: Not on file

## 2021-12-28 ENCOUNTER — Ambulatory Visit (INDEPENDENT_AMBULATORY_CARE_PROVIDER_SITE_OTHER): Payer: Managed Care, Other (non HMO) | Admitting: Physician Assistant

## 2021-12-28 ENCOUNTER — Encounter: Payer: Self-pay | Admitting: Physician Assistant

## 2021-12-28 DIAGNOSIS — M65832 Other synovitis and tenosynovitis, left forearm: Secondary | ICD-10-CM

## 2021-12-28 MED ORDER — CEPHALEXIN 500 MG PO CAPS: 500.0000 mg | ORAL_CAPSULE | Freq: Four times a day (QID) | ORAL | 0 refills | Status: DC

## 2021-12-28 NOTE — Progress Notes (Signed)
Office Visit Note   Patient: Taylor Heath           Date of Birth: 24-May-1960           MRN: [id number] Visit Date: 12/28/2021              Requested by: No referring provider defined for this encounter. PCP: Pcp, No  Chief Complaint  Patient presents with   Left Wrist - Follow-up    Left wrist extensor tenolysis 12/17/2021 with Dr.Xu      HPI: Taylor Heath is a pleasant 61 year old gentleman who is a patient of Dr. Roda Shutters.  He is 10 days status post extensor tenolysis of his left wrist.  He was seen by Dr. Deno Etienne last week.  He admits he was not wearing his splint.  He also over the weekend was moving plants inside.  He noticed that one of the stitches came out and he had quite a bit of clear drainage.  Denies any fever chills or increase in pain.  Assessment & Plan: Visit Diagnoses: Left wrist pain  Plan: Had a long discussion with the patient.  I would like for him to be doing a dressing change daily and rinsing his wrist with antibacterial soap and water and then applying a new dressing.  We will place him on Keflex which she is to start tonight.  He will follow-up with Dr. Roda Shutters later this week.  If he has any fever chills ascending cellulitis increased pain or swelling he is to contact us immediately or go to the emergency room.  Also emphasized the importance of wearing his splint and avoid significant activities with this wrist until it is healed  Follow-Up Instructions: Return in about 4 days (around 01/01/2022).   Ortho Exam  Patient is alert, oriented, no adenopathy, well-dressed, normal affect, normal respiratory effort. Examination of his wrist he has some erythema around the incision but no ascending cellulitis.  He has a small amount of clear drainage from an area of wound dehiscence with a suture that has become untied.  Sensation is intact radial pulses intact no purulence no foul odor  Imaging: No results found.   Labs: Lab Results  Component Value Date   ESRSEDRATE  5 01/19/2017   CRP <0.8 01/19/2017     Lab Results  Component Value Date   ALBUMIN 4.5 01/19/2017    No results found for: "MG" No results found for: "VD25OH"  No results found for: "PREALBUMIN"    Latest Ref Rng & Units 01/28/2017    3:25 AM 01/19/2017    3:19 PM  CBC EXTENDED  WBC 4.0 - 10.5 K/uL 8.5  6.4   RBC 4.22 - 5.81 MIL/uL 4.02  5.12   Hemoglobin 13.0 - 17.0 g/dL 08.1  44.8   HCT 18.5 - 52.0 % 36.8  46.5   Platelets 150 - 400 K/uL 248  245   NEUT# 1.7 - 7.7 K/uL  4.0   Lymph# 0.7 - 4.0 K/uL  1.8      There is no height or weight on file to calculate BMI.  Orders:  No orders of the defined types were placed in this encounter.  No orders of the defined types were placed in this encounter.    Procedures: No procedures performed  Clinical Data: No additional findings.  ROS:  All other systems negative, except as noted in the HPI. Review of Systems  Objective: Vital Signs: There were no vitals taken for this visit.  Specialty  Comments:  No specialty comments available.  PMFS History: Patient Active Problem List   Diagnosis Date Noted   Extensor tenosynovitis of left wrist 12/01/2021   Primary osteoarthritis of right wrist 04/26/2017   Radiculopathy of cervical spine 04/26/2017   Total knee replacement status 01/27/2017   Primary osteoarthritis of right shoulder 12/30/2016   Primary osteoarthritis of right knee 12/30/2016   Past Medical History:  Diagnosis Date   Anxiety    Arthritis    History of kidney stones     No family history on file.  Past Surgical History:  Procedure Laterality Date   JOINT REPLACEMENT     left hip replaced   KNEE ARTHROSCOPY W/ ACL RECONSTRUCTION Right 2009   ORIF FINGER / THUMB FRACTURE     TOTAL KNEE ARTHROPLASTY Right 01/27/2017   Procedure: RIGHT TOTAL KNEE ARTHROPLASTY;  Surgeon: Leandrew Koyanagi, MD;  Location: [facility];  Service: Orthopedics;  Laterality: Right;   Social History   Occupational History    Not on file  Tobacco Use   Smoking status: Former    Types: Cigarettes    Quit date: 04/21/2010    Years since quitting: 11.6   Smokeless tobacco: Never  Vaping Use   Vaping Use: Never used  Substance and Sexual Activity   Alcohol use: No   Drug use: No   Sexual activity: Not on file

## 2022-01-01 ENCOUNTER — Ambulatory Visit (INDEPENDENT_AMBULATORY_CARE_PROVIDER_SITE_OTHER): Payer: Managed Care, Other (non HMO) | Admitting: Orthopaedic Surgery

## 2022-01-01 ENCOUNTER — Other Ambulatory Visit: Payer: Self-pay | Admitting: Physician Assistant

## 2022-01-01 ENCOUNTER — Encounter: Payer: Self-pay | Admitting: Orthopaedic Surgery

## 2022-01-01 DIAGNOSIS — M65832 Other synovitis and tenosynovitis, left forearm: Secondary | ICD-10-CM

## 2022-01-01 MED ORDER — DOXYCYCLINE HYCLATE 100 MG PO TABS: 100.0000 mg | ORAL_TABLET | Freq: Two times a day (BID) | ORAL | 0 refills | Status: DC

## 2022-01-01 NOTE — Progress Notes (Signed)
Post-Op Visit Note   Patient: Taylor Heath           Date of Birth: 12-29-1960           MRN: [id number] Visit Date: 01/01/2022 PCP: Pcp, No   Assessment & Plan:  Chief Complaint:  Chief Complaint  Patient presents with   Left Wrist - Follow-up    Left wrist extensor tenolysis 12/17/2021   Visit Diagnoses:  1. Extensor tenosynovitis of left wrist     Plan: Patient is a pleasant 61 year old gentleman who comes in today 2 weeks status post left wrist extensor tenolysis 12/17/2021.  He was doing well up until earlier this week when he was lifting a potted plant and a suture came unraveled.  He was not wearing his wrist splint.  He noticed drainage and was seen in our office.  He was started on Keflex for 5 days.  He was told to wash the wound with Dial soap twice daily and cover with a bandage.  He denies any worsening pain or drainage.  No fevers or chills or any other constitutional symptoms.  Examination of his left wrist reveals a well-healing incision with nylon sutures in place with the exception of the distal wound which has dehisced and is draining serosanguineous fluid.  There is no cellulitis or induration.  No warmth.  Fingers are not swollen.  They are warm and well-perfused and he has near full range of motion.  At this point, I am not concerned for infection.  I feel like he has a reaccumulation of fluid from the underlying inflammatory response.  We will go ahead and remove the remaining sutures.  We will have him continue with wet-to-dry dressings twice daily.  Doxycycline has been sent into his pharmacy.  He will follow-up with [place] next week for recheck.  Should he develop any worsening or concerning symptoms in the meantime, he will let us know.  Follow-Up Instructions: Return in about 1 week (around 01/08/2022).   Orders:  No orders of the defined types were placed in this encounter.  No orders of the defined types were placed in this encounter.   Imaging: No  results found.  PMFS History: Patient Active Problem List   Diagnosis Date Noted   Extensor tenosynovitis of left wrist 12/01/2021   Primary osteoarthritis of right wrist 04/26/2017   Radiculopathy of cervical spine 04/26/2017   Total knee replacement status 01/27/2017   Primary osteoarthritis of right shoulder 12/30/2016   Primary osteoarthritis of right knee 12/30/2016   Past Medical History:  Diagnosis Date   Anxiety    Arthritis    History of kidney stones     No family history on file.  Past Surgical History:  Procedure Laterality Date   JOINT REPLACEMENT     left hip replaced   KNEE ARTHROSCOPY W/ ACL RECONSTRUCTION Right 2009   ORIF FINGER / THUMB FRACTURE     TOTAL KNEE ARTHROPLASTY Right 01/27/2017   Procedure: RIGHT TOTAL KNEE ARTHROPLASTY;  Surgeon: Leandrew Koyanagi, MD;  Location: [facility];  Service: Orthopedics;  Laterality: Right;   Social History   Occupational History   Not on file  Tobacco Use   Smoking status: Former    Types: Cigarettes    Quit date: 04/21/2010    Years since quitting: 11.7   Smokeless tobacco: Never  Vaping Use   Vaping Use: Never used  Substance and Sexual Activity   Alcohol use: No   Drug use: No  Sexual activity: Not on file

## 2022-01-05 ENCOUNTER — Telehealth: Payer: Self-pay | Admitting: Orthopaedic Surgery

## 2022-01-05 NOTE — Telephone Encounter (Signed)
Out of work until he follows up with me

## 2022-01-05 NOTE — Telephone Encounter (Signed)
Pt's wife called stated FMLA extended his time off. [employer] told pt to call nurse to find out new return to work date. Please call pt's wife Barnett Applebaum at [phone].

## 2022-01-06 NOTE — Telephone Encounter (Signed)
Patient is scheduled to come in tomorrow. Will address at that time.

## 2022-01-07 ENCOUNTER — Encounter: Payer: Self-pay | Admitting: Orthopaedic Surgery

## 2022-01-07 ENCOUNTER — Ambulatory Visit (INDEPENDENT_AMBULATORY_CARE_PROVIDER_SITE_OTHER): Payer: Managed Care, Other (non HMO) | Admitting: Orthopaedic Surgery

## 2022-01-07 DIAGNOSIS — M65832 Other synovitis and tenosynovitis, left forearm: Secondary | ICD-10-CM

## 2022-01-07 MED ORDER — DOXYCYCLINE HYCLATE 100 MG PO TABS: 100.0000 mg | ORAL_TABLET | Freq: Two times a day (BID) | ORAL | 0 refills | Status: DC

## 2022-01-07 NOTE — Telephone Encounter (Signed)
Noted for [employer] ?

## 2022-01-07 NOTE — Progress Notes (Signed)
   Post-Op Visit Note   Patient: Taylor Heath           Date of Birth: [birth year]           MRN: [id number] Visit Date: 01/07/2022 PCP: Pcp, No   Assessment & Plan:  Chief Complaint:  Chief Complaint  Patient presents with   Left Hand - Routine Post Op   Visit Diagnoses:  1. Extensor tenosynovitis of left wrist     Plan: Taylor Heath returns today for recheck of his left hand wound.  He has been doing well has no problems.  Has not had any side effects from the doxycycline.  Examination of left hand shows a healing surgical wound.  There is no cellulitis or evidence of deep infection.  There is mild amount of serous drainage.  The swelling is overall much better.  We will continue wet-to-dry dressings and immobilization to the wrist and the fingers.  Continue doxycycline.  Recheck in 2 weeks.  He will need to be out of work for at least another couple weeks for this problem.  Follow-Up Instructions: Return in about 2 weeks (around 01/21/2022).   Orders:  No orders of the defined types were placed in this encounter.  Meds ordered this encounter  Medications   doxycycline (VIBRA-TABS) 100 MG tablet    Sig: Take 1 tablet (100 mg total) by mouth 2 (two) times daily.    Dispense:  20 tablet    Refill:  0    Imaging: No results found.  PMFS History: Patient Active Problem List   Diagnosis Date Noted   Extensor tenosynovitis of left wrist 12/01/2021   Primary osteoarthritis of right wrist 04/26/2017   Radiculopathy of cervical spine 04/26/2017   Total knee replacement status 01/27/2017   Primary osteoarthritis of right shoulder 12/30/2016   Primary osteoarthritis of right knee 12/30/2016   Past Medical History:  Diagnosis Date   Anxiety    Arthritis    History of kidney stones     History reviewed. No pertinent family history.  Past Surgical History:  Procedure Laterality Date   JOINT REPLACEMENT     left hip replaced   KNEE ARTHROSCOPY W/ ACL RECONSTRUCTION Right  2009   ORIF FINGER / THUMB FRACTURE     TOTAL KNEE ARTHROPLASTY Right 01/27/2017   Procedure: RIGHT TOTAL KNEE ARTHROPLASTY;  Surgeon: Tarry Kos, MD;  Location: [facility];  Service: Orthopedics;  Laterality: Right;   Social History   Occupational History   Not on file  Tobacco Use   Smoking status: Former    Types: Cigarettes    Quit date: 04/21/2010    Years since quitting: 11.7   Smokeless tobacco: Never  Vaping Use   Vaping Use: Never used  Substance and Sexual Activity   Alcohol use: No   Drug use: No   Sexual activity: Not on file

## 2022-01-11 ENCOUNTER — Telehealth: Payer: Self-pay | Admitting: Orthopaedic Surgery

## 2022-01-11 NOTE — Telephone Encounter (Signed)
[employer] disability forms received. To [employer].

## 2022-01-26 ENCOUNTER — Other Ambulatory Visit: Payer: Self-pay

## 2022-01-26 ENCOUNTER — Telehealth: Payer: Self-pay | Admitting: Orthopaedic Surgery

## 2022-01-26 ENCOUNTER — Ambulatory Visit (INDEPENDENT_AMBULATORY_CARE_PROVIDER_SITE_OTHER): Payer: Managed Care, Other (non HMO) | Admitting: Orthopaedic Surgery

## 2022-01-26 ENCOUNTER — Encounter (HOSPITAL_COMMUNITY): Payer: Self-pay | Admitting: Orthopaedic Surgery

## 2022-01-26 DIAGNOSIS — L02512 Cutaneous abscess of left hand: Secondary | ICD-10-CM | POA: Insufficient documentation

## 2022-01-26 NOTE — Telephone Encounter (Signed)
[employer] disability forms received. To [employer].

## 2022-01-26 NOTE — Progress Notes (Signed)
PCP - denies Cardiologist - denies  PPM/ICD - denies  Chest x-ray - n/a EKG - 01/19/17 - normal  CPAP - n/a  Fasting Blood Sugar - n/a  Blood Thinner Instructions: n/a Aspirin Instructions: Patient was instructed: As of today, STOP taking any Aspirin (unless otherwise instructed by your surgeon) Aleve, Naproxen, Ibuprofen, Motrin, Advil, Goody's, BC's, all herbal medications, fish oil, and all vitamins.  ERAS Protcol - yes, until 11:00 o'clock   COVID TEST- n/a  Anesthesia review: no  Patient verbally denies any shortness of breath, fever, cough and chest pain during phone call   -------------  SDW INSTRUCTIONS given:  Your procedure is scheduled on Wednesday, November 29th,2023.  Report to [facility] Main Entrance "A" at 11:30 A.M., and check in at the Admitting office.  Call this number if you have problems the morning of surgery:  [phone]   Remember:  Do not eat after midnight the night before your surgery  You may drink clear liquids until 11:00 the morning of your surgery.   Clear liquids allowed are: Water, Non-Citrus Juices (without pulp), Carbonated Beverages, Clear Tea, Black Coffee Only, and Gatorade    Take these medicines the morning of surgery with A SIP OF WATER  - Prilosec, Pravastatin, Valtrex PRN: Tylenol   The day of surgery:                     Do not wear jewelry,             Do not wear lotions, powders, colognes, or deodorant.            Men may shave face and neck.            Do not bring valuables to the hospital.            [facility] is not responsible for any belongings or valuables.  Do NOT Smoke (Tobacco/Vaping) 24 hours prior to your procedure If you use a CPAP at night, you may bring all equipment for your overnight stay.   Contacts, glasses, dentures or bridgework may not be worn into surgery.      For patients admitted to the hospital, discharge time will be determined by your treatment team.   Patients discharged the day  of surgery will not be allowed to drive home, and someone needs to stay with them for 24 hours.    Special instructions:   [place]- Preparing For Surgery  Before surgery, you can play an important role. Because skin is not sterile, your skin needs to be as free of germs as possible. You can reduce the number of germs on your skin by washing with CHG (chlorahexidine gluconate) Soap before surgery.  CHG is an antiseptic cleaner which kills germs and bonds with the skin to continue killing germs even after washing.    Oral Hygiene is also important to reduce your risk of infection.  Remember - BRUSH YOUR TEETH THE MORNING OF SURGERY WITH YOUR REGULAR TOOTHPASTE  Please do not use if you have an allergy to CHG or antibacterial soaps. If your skin becomes reddened/irritated stop using the CHG.  Do not shave (including legs and underarms) for at least 48 hours prior to first CHG shower. It is OK to shave your face.  Please follow these instructions carefully.   Shower the NIGHT BEFORE SURGERY and the MORNING OF SURGERY with DIAL Soap.   Pat yourself dry with a CLEAN TOWEL.  Wear CLEAN PAJAMAS to bed  the night before surgery  Place CLEAN SHEETS on your bed the night of your first shower and DO NOT SLEEP WITH PETS.   Day of Surgery: Please shower morning of surgery  Wear Clean/Comfortable clothing the morning of surgery Do not apply any deodorants/lotions.   Remember to brush your teeth WITH YOUR REGULAR TOOTHPASTE.   Questions were answered. Patient verbalized understanding of instructions.

## 2022-01-26 NOTE — Progress Notes (Signed)
   Post-Op Visit Note   Patient: Taylor Heath           Date of Birth: [birth year]           MRN: [id number] Visit Date: 01/26/2022 PCP: Pcp, No   Assessment & Plan:  Chief Complaint:  Chief Complaint  Patient presents with   Left Wrist - Follow-up   Visit Diagnoses:  1. Abscess of dorsum of left hand     Plan: Mr. Becherer returns today for recheck of his left hand wound.  Underwent left wrist extensor tenolysis on 12/17/2021.  He has been on antibiotics which she finished last Thursday.  He is complaining of a lot of swelling and soreness.  Felt like he had some chills yesterday.  Examination of the left hand and wrist shows continued swelling and erythema.  There is a scant amount of pus that I can express from the surgical site.  He has pain and discomfort with wrist extension and finger extension.  From my standpoint I am concerned that he has a surgical site infection and deep hand abscess.  We will need to take him to the operating room for a formal I&D tomorrow and intraoperative cultures.  Would also like to admit him to the hospital postoperatively for IV antibiotics.  Follow-Up Instructions: No follow-ups on file.   Orders:  No orders of the defined types were placed in this encounter.  No orders of the defined types were placed in this encounter.   Imaging: No results found.  PMFS History: Patient Active Problem List   Diagnosis Date Noted   Extensor tenosynovitis of left wrist 12/01/2021   Primary osteoarthritis of right wrist 04/26/2017   Radiculopathy of cervical spine 04/26/2017   Total knee replacement status 01/27/2017   Primary osteoarthritis of right shoulder 12/30/2016   Primary osteoarthritis of right knee 12/30/2016   Past Medical History:  Diagnosis Date   Anxiety    Arthritis    History of kidney stones     No family history on file.  Past Surgical History:  Procedure Laterality Date   JOINT REPLACEMENT     left hip replaced   KNEE  ARTHROSCOPY W/ ACL RECONSTRUCTION Right 2009   ORIF FINGER / THUMB FRACTURE     TOTAL KNEE ARTHROPLASTY Right 01/27/2017   Procedure: RIGHT TOTAL KNEE ARTHROPLASTY;  Surgeon: Tarry Kos, MD;  Location: [facility];  Service: Orthopedics;  Laterality: Right;   Social History   Occupational History   Not on file  Tobacco Use   Smoking status: Former    Types: Cigarettes    Quit date: 04/21/2010    Years since quitting: 11.7   Smokeless tobacco: Never  Vaping Use   Vaping Use: Never used  Substance and Sexual Activity   Alcohol use: No   Drug use: No   Sexual activity: Not on file

## 2022-01-26 NOTE — Telephone Encounter (Signed)
FMLA forms received. To [employer]. 

## 2022-01-27 ENCOUNTER — Other Ambulatory Visit: Payer: Self-pay

## 2022-01-27 ENCOUNTER — Inpatient Hospital Stay (HOSPITAL_COMMUNITY)
Admission: RE | Admit: 2022-01-27 | Discharge: 2022-01-30 | DRG: 857 | Disposition: A | Discharge status: Home / Self Care | Payer: Managed Care, Other (non HMO) | Attending: Orthopaedic Surgery | Admitting: Orthopaedic Surgery

## 2022-01-27 ENCOUNTER — Inpatient Hospital Stay (HOSPITAL_COMMUNITY): Payer: Managed Care, Other (non HMO) | Admitting: Anesthesiology

## 2022-01-27 ENCOUNTER — Encounter (HOSPITAL_COMMUNITY): Payer: Self-pay | Admitting: Orthopaedic Surgery

## 2022-01-27 ENCOUNTER — Encounter (HOSPITAL_COMMUNITY)
Admission: RE | Disposition: A | Discharge status: Home / Self Care | Payer: Self-pay | Source: Home / Self Care | Attending: Orthopaedic Surgery

## 2022-01-27 DIAGNOSIS — M659 Synovitis and tenosynovitis, unspecified: Secondary | ICD-10-CM | POA: Diagnosis present

## 2022-01-27 DIAGNOSIS — L02512 Cutaneous abscess of left hand: Secondary | ICD-10-CM

## 2022-01-27 DIAGNOSIS — Z888 Allergy status to other drugs, medicaments and biological substances status: Secondary | ICD-10-CM | POA: Diagnosis not present

## 2022-01-27 DIAGNOSIS — Z96642 Presence of left artificial hip joint: Secondary | ICD-10-CM | POA: Diagnosis present

## 2022-01-27 DIAGNOSIS — M1990 Unspecified osteoarthritis, unspecified site: Secondary | ICD-10-CM

## 2022-01-27 DIAGNOSIS — Y838 Other surgical procedures as the cause of abnormal reaction of the patient, or of later complication, without mention of misadventure at the time of the procedure: Secondary | ICD-10-CM | POA: Diagnosis present

## 2022-01-27 DIAGNOSIS — Z87891 Personal history of nicotine dependence: Secondary | ICD-10-CM

## 2022-01-27 DIAGNOSIS — T8141XA Infection following a procedure, superficial incisional surgical site, initial encounter: Secondary | ICD-10-CM | POA: Diagnosis present

## 2022-01-27 DIAGNOSIS — M069 Rheumatoid arthritis, unspecified: Secondary | ICD-10-CM | POA: Diagnosis present

## 2022-01-27 DIAGNOSIS — G5603 Carpal tunnel syndrome, bilateral upper limbs: Secondary | ICD-10-CM | POA: Diagnosis present

## 2022-01-27 DIAGNOSIS — Z96651 Presence of right artificial knee joint: Secondary | ICD-10-CM | POA: Diagnosis present

## 2022-01-27 DIAGNOSIS — Z87442 Personal history of urinary calculi: Secondary | ICD-10-CM | POA: Diagnosis not present

## 2022-01-27 DIAGNOSIS — F419 Anxiety disorder, unspecified: Secondary | ICD-10-CM | POA: Diagnosis present

## 2022-01-27 DIAGNOSIS — Z79899 Other long term (current) drug therapy: Secondary | ICD-10-CM

## 2022-01-27 DIAGNOSIS — K219 Gastro-esophageal reflux disease without esophagitis: Secondary | ICD-10-CM | POA: Diagnosis present

## 2022-01-27 DIAGNOSIS — A4901 Methicillin susceptible Staphylococcus aureus infection, unspecified site: Secondary | ICD-10-CM | POA: Diagnosis present

## 2022-01-27 DIAGNOSIS — M65832 Other synovitis and tenosynovitis, left forearm: Principal | ICD-10-CM

## 2022-01-27 HISTORY — PX: I & D EXTREMITY: SHX5045

## 2022-01-27 HISTORY — DX: Pneumonia, unspecified organism: J18.9

## 2022-01-27 HISTORY — DX: Gastro-esophageal reflux disease without esophagitis: K21.9

## 2022-01-27 LAB — SURGICAL PCR SCREEN
MRSA, PCR: NEGATIVE
Staphylococcus aureus: NEGATIVE

## 2022-01-27 LAB — CBC WITH DIFFERENTIAL/PLATELET
Abs Immature Granulocytes: 0.02 10*3/uL (ref 0.00–0.07)
Basophils Absolute: 0 10*3/uL (ref 0.0–0.1)
Basophils Relative: 0 %
Eosinophils Absolute: 0.1 10*3/uL (ref 0.0–0.5)
Eosinophils Relative: 2 %
HCT: 42.1 % (ref 39.0–52.0)
Hemoglobin: 14.3 g/dL (ref 13.0–17.0)
Immature Granulocytes: 0 %
Lymphocytes Relative: 21 %
Lymphs Abs: 1.6 10*3/uL (ref 0.7–4.0)
MCH: 31.2 pg (ref 26.0–34.0)
MCHC: 34 g/dL (ref 30.0–36.0)
MCV: 91.7 fL (ref 80.0–100.0)
Monocytes Absolute: 0.5 10*3/uL (ref 0.1–1.0)
Monocytes Relative: 6 %
Neutro Abs: 5.4 10*3/uL (ref 1.7–7.7)
Neutrophils Relative %: 71 %
Platelets: 269 10*3/uL (ref 150–400)
RBC: 4.59 MIL/uL (ref 4.22–5.81)
RDW: 13.2 % (ref 11.5–15.5)
WBC: 7.7 10*3/uL (ref 4.0–10.5)
nRBC: 0 % (ref 0.0–0.2)

## 2022-01-27 LAB — BASIC METABOLIC PANEL
Anion gap: 11 (ref 5–15)
BUN: 14 mg/dL (ref 8–23)
CO2: 24 mmol/L (ref 22–32)
Calcium: 8.8 mg/dL — ABNORMAL LOW (ref 8.9–10.3)
Chloride: 103 mmol/L (ref 98–111)
Creatinine, Ser: 0.91 mg/dL (ref 0.61–1.24)
GFR, Estimated: >60 mL/min (ref >60)
Glucose, Bld: 121 mg/dL — ABNORMAL HIGH (ref 70–99)
Potassium: 4.1 mmol/L (ref 3.5–5.1)
Sodium: 138 mmol/L (ref 135–145)

## 2022-01-27 SURGERY — IRRIGATION AND DEBRIDEMENT EXTREMITY
Anesthesia: General | Site: Hand | Laterality: Left

## 2022-01-27 MED ORDER — TRAMADOL HCL 50 MG PO TABS
50.0000 mg | ORAL_TABLET | Freq: Four times a day (QID) | ORAL | Status: DC
  Administered 2022-01-27 – 2022-01-30 (×11): 50 mg via ORAL
  Filled 2022-01-27 (×11): qty 1

## 2022-01-27 MED ORDER — DEXAMETHASONE SODIUM PHOSPHATE 10 MG/ML IJ SOLN
INTRAMUSCULAR | Status: AC
  Filled 2022-01-27: qty 1

## 2022-01-27 MED ORDER — OXYCODONE HCL 5 MG PO TABS: 5.0000 mg | ORAL_TABLET | Freq: Once | ORAL | Status: AC | PRN

## 2022-01-27 MED ORDER — PROPOFOL 10 MG/ML IV BOLUS
INTRAVENOUS | Status: DC | PRN
  Administered 2022-01-27: 200 mg via INTRAVENOUS

## 2022-01-27 MED ORDER — DEXAMETHASONE SODIUM PHOSPHATE 10 MG/ML IJ SOLN
INTRAMUSCULAR | Status: DC | PRN
  Administered 2022-01-27: 10 mg via INTRAVENOUS

## 2022-01-27 MED ORDER — ACETAMINOPHEN 10 MG/ML IV SOLN
INTRAVENOUS | Status: DC | PRN
  Administered 2022-01-27: 1000 mg via INTRAVENOUS

## 2022-01-27 MED ORDER — PHENYLEPHRINE 80 MCG/ML (10ML) SYRINGE FOR IV PUSH (FOR BLOOD PRESSURE SUPPORT)
PREFILLED_SYRINGE | INTRAVENOUS | Status: DC | PRN
  Administered 2022-01-27 (×2): 80 ug via INTRAVENOUS

## 2022-01-27 MED ORDER — FENTANYL CITRATE (PF) 250 MCG/5ML IJ SOLN
INTRAMUSCULAR | Status: AC
  Filled 2022-01-27: qty 5

## 2022-01-27 MED ORDER — SODIUM CHLORIDE 0.9 % IR SOLN
Status: DC | PRN
  Administered 2022-01-27: 3000 mL

## 2022-01-27 MED ORDER — HYDROMORPHONE HCL 1 MG/ML IJ SOLN
0.2500 mg | INTRAMUSCULAR | Status: DC | PRN
  Administered 2022-01-27: 0.5 mg via INTRAVENOUS

## 2022-01-27 MED ORDER — MORPHINE SULFATE (PF) 2 MG/ML IV SOLN
0.5000 mg | INTRAVENOUS | Status: DC | PRN
  Administered 2022-01-30: 1 mg via INTRAVENOUS
  Filled 2022-01-27: qty 1

## 2022-01-27 MED ORDER — PROPOFOL 10 MG/ML IV BOLUS
INTRAVENOUS | Status: AC
  Filled 2022-01-27: qty 20

## 2022-01-27 MED ORDER — HYDROMORPHONE HCL 1 MG/ML IJ SOLN
INTRAMUSCULAR | Status: AC
  Filled 2022-01-27: qty 1

## 2022-01-27 MED ORDER — FENTANYL CITRATE (PF) 250 MCG/5ML IJ SOLN
INTRAMUSCULAR | Status: DC | PRN
  Administered 2022-01-27: 50 ug via INTRAVENOUS
  Administered 2022-01-27: 25 ug via INTRAVENOUS
  Administered 2022-01-27: 50 ug via INTRAVENOUS
  Administered 2022-01-27: 25 ug via INTRAVENOUS
  Administered 2022-01-27 (×2): 50 ug via INTRAVENOUS

## 2022-01-27 MED ORDER — PROMETHAZINE HCL 25 MG/ML IJ SOLN
6.2500 mg | INTRAMUSCULAR | Status: DC | PRN
  Administered 2022-01-27: 6.25 mg via INTRAVENOUS

## 2022-01-27 MED ORDER — CEFAZOLIN SODIUM-DEXTROSE 2-3 GM-%(50ML) IV SOLR
INTRAVENOUS | Status: DC | PRN
  Administered 2022-01-27: 2 g via INTRAVENOUS

## 2022-01-27 MED ORDER — HYDROCODONE-ACETAMINOPHEN 7.5-325 MG PO TABS
1.0000 | ORAL_TABLET | ORAL | Status: DC | PRN
  Administered 2022-01-29: 2 via ORAL
  Filled 2022-01-27: qty 2

## 2022-01-27 MED ORDER — OXYCODONE HCL 5 MG PO TABS
ORAL_TABLET | ORAL | Status: AC
  Administered 2022-01-27: 5 mg via ORAL
  Filled 2022-01-27: qty 1

## 2022-01-27 MED ORDER — MIDAZOLAM HCL 2 MG/2ML IJ SOLN
INTRAMUSCULAR | Status: AC
  Filled 2022-01-27: qty 2

## 2022-01-27 MED ORDER — ACETAMINOPHEN 500 MG PO TABS
500.0000 mg | ORAL_TABLET | Freq: Four times a day (QID) | ORAL | Status: DC
  Filled 2022-01-27: qty 1

## 2022-01-27 MED ORDER — VANCOMYCIN HCL IN DEXTROSE 1-5 GM/200ML-% IV SOLN
1000.0000 mg | INTRAVENOUS | Status: DC
  Filled 2022-01-27: qty 200

## 2022-01-27 MED ORDER — ACETAMINOPHEN 500 MG PO TABS
500.0000 mg | ORAL_TABLET | Freq: Four times a day (QID) | ORAL | Status: AC
  Administered 2022-01-27 – 2022-01-28 (×3): 500 mg via ORAL
  Filled 2022-01-27 (×3): qty 1

## 2022-01-27 MED ORDER — ACETAMINOPHEN 325 MG PO TABS
325.0000 mg | ORAL_TABLET | Freq: Four times a day (QID) | ORAL | Status: DC | PRN
  Administered 2022-01-28 – 2022-01-29 (×2): 650 mg via ORAL
  Filled 2022-01-27 (×2): qty 2

## 2022-01-27 MED ORDER — MIDAZOLAM HCL 2 MG/2ML IJ SOLN
INTRAMUSCULAR | Status: DC | PRN
  Administered 2022-01-27: 2 mg via INTRAVENOUS

## 2022-01-27 MED ORDER — LIDOCAINE 2% (20 MG/ML) 5 ML SYRINGE
INTRAMUSCULAR | Status: DC | PRN
  Administered 2022-01-27: 60 mg via INTRAVENOUS

## 2022-01-27 MED ORDER — AMISULPRIDE (ANTIEMETIC) 5 MG/2ML IV SOLN: 10.0000 mg | Freq: Once | INTRAVENOUS | Status: DC | PRN

## 2022-01-27 MED ORDER — 0.9 % SODIUM CHLORIDE (POUR BTL) OPTIME
TOPICAL | Status: DC | PRN
  Administered 2022-01-27: 1000 mL

## 2022-01-27 MED ORDER — OXYCODONE HCL 5 MG/5ML PO SOLN: 5.0000 mg | Freq: Once | ORAL | Status: AC | PRN

## 2022-01-27 MED ORDER — SUGAMMADEX SODIUM 500 MG/5ML IV SOLN
INTRAVENOUS | Status: AC
  Filled 2022-01-27: qty 5

## 2022-01-27 MED ORDER — PROMETHAZINE HCL 25 MG/ML IJ SOLN
INTRAMUSCULAR | Status: AC
  Filled 2022-01-27: qty 1

## 2022-01-27 MED ORDER — ORAL CARE MOUTH RINSE: 15.0000 mL | Freq: Once | OROMUCOSAL | Status: AC

## 2022-01-27 MED ORDER — ONDANSETRON HCL 4 MG/2ML IJ SOLN
INTRAMUSCULAR | Status: AC
  Filled 2022-01-27: qty 2

## 2022-01-27 MED ORDER — ESMOLOL HCL 100 MG/10ML IV SOLN
INTRAVENOUS | Status: AC
  Filled 2022-01-27: qty 10

## 2022-01-27 MED ORDER — LIDOCAINE 2% (20 MG/ML) 5 ML SYRINGE
INTRAMUSCULAR | Status: AC
  Filled 2022-01-27: qty 5

## 2022-01-27 MED ORDER — ONDANSETRON HCL 4 MG/2ML IJ SOLN
INTRAMUSCULAR | Status: DC | PRN
  Administered 2022-01-27: 4 mg via INTRAVENOUS

## 2022-01-27 MED ORDER — LACTATED RINGERS IV SOLN: INTRAVENOUS | Status: DC

## 2022-01-27 MED ORDER — SODIUM CHLORIDE 0.9 % IV SOLN
3.0000 g | Freq: Four times a day (QID) | INTRAVENOUS | Status: DC
  Administered 2022-01-27 – 2022-01-29 (×7): 3 g via INTRAVENOUS
  Filled 2022-01-27 (×7): qty 8

## 2022-01-27 MED ORDER — HYDROCODONE-ACETAMINOPHEN 5-325 MG PO TABS: 1.0000 | ORAL_TABLET | ORAL | Status: DC | PRN

## 2022-01-27 MED ORDER — VANCOMYCIN HCL 1000 MG IV SOLR
1000.0000 mg | Freq: Once | INTRAVENOUS | Status: DC
  Administered 2022-01-27: 1000 mg via INTRAVENOUS

## 2022-01-27 MED ORDER — CHLORHEXIDINE GLUCONATE 0.12 % MT SOLN
15.0000 mL | Freq: Once | OROMUCOSAL | Status: AC
  Administered 2022-01-27: 15 mL via OROMUCOSAL
  Filled 2022-01-27: qty 15

## 2022-01-27 MED ORDER — ACETAMINOPHEN 500 MG PO TABS
1000.0000 mg | ORAL_TABLET | Freq: Once | ORAL | Status: DC
  Filled 2022-01-27: qty 2

## 2022-01-27 SURGICAL SUPPLY — 52 items
BAG COUNTER SPONGE SURGICOUNT (BAG) ×1 IMPLANT
BNDG COHESIVE 4X5 TAN STRL (GAUZE/BANDAGES/DRESSINGS) ×1 IMPLANT
BNDG COHESIVE 6X5 TAN STRL LF (GAUZE/BANDAGES/DRESSINGS) ×2 IMPLANT
BNDG ELASTIC 3X5.8 VLCR STR LF (GAUZE/BANDAGES/DRESSINGS) IMPLANT
BNDG GAUZE DERMACEA FLUFF 4 (GAUZE/BANDAGES/DRESSINGS) ×1 IMPLANT
COVER SURGICAL LIGHT HANDLE (MISCELLANEOUS) ×1 IMPLANT
CUFF TOURN SGL QUICK 24 (TOURNIQUET CUFF) ×1
CUFF TOURN SGL QUICK 34 (TOURNIQUET CUFF)
CUFF TOURN SGL QUICK 42 (TOURNIQUET CUFF) IMPLANT
CUFF TRNQT CYL 24X4X16.5-23 (TOURNIQUET CUFF) IMPLANT
CUFF TRNQT CYL 34X4.125X (TOURNIQUET CUFF) IMPLANT
DRAPE U-SHAPE 47X51 STRL (DRAPES) ×1 IMPLANT
DURAPREP 26ML APPLICATOR (WOUND CARE) ×1 IMPLANT
ELECT REM PT RETURN 9FT ADLT (ELECTROSURGICAL)
ELECTRODE REM PT RTRN 9FT ADLT (ELECTROSURGICAL) IMPLANT
GAUZE PAD ABD 8X10 STRL (GAUZE/BANDAGES/DRESSINGS) ×1 IMPLANT
GAUZE SPONGE 4X4 12PLY STRL (GAUZE/BANDAGES/DRESSINGS) ×2 IMPLANT
GAUZE SPONGE 4X4 12PLY STRL LF (GAUZE/BANDAGES/DRESSINGS) IMPLANT
GAUZE XEROFORM 5X9 LF (GAUZE/BANDAGES/DRESSINGS) ×1 IMPLANT
GLOVE BIOGEL PI IND STRL 7.0 (GLOVE) ×2 IMPLANT
GLOVE BIOGEL PI IND STRL 7.5 (GLOVE) ×1 IMPLANT
GLOVE ECLIPSE 7.0 STRL STRAW (GLOVE) ×1 IMPLANT
GLOVE SKINSENSE STRL SZ7.5 (GLOVE) ×2 IMPLANT
GLOVE SURG SYN 7.5  E (GLOVE) ×2
GLOVE SURG SYN 7.5 E (GLOVE) ×2 IMPLANT
GLOVE SURG SYN 7.5 PF PI (GLOVE) ×2 IMPLANT
GLOVE SURG UNDER POLY LF SZ7 (GLOVE) ×19 IMPLANT
GLOVE SURG UNDER POLY LF SZ7.5 (GLOVE) ×4 IMPLANT
GOWN STRL REIN XL XLG (GOWN DISPOSABLE) ×2 IMPLANT
HANDPIECE INTERPULSE COAX TIP (DISPOSABLE)
KIT BASIN OR (CUSTOM PROCEDURE TRAY) ×1 IMPLANT
KIT TURNOVER KIT B (KITS) ×1 IMPLANT
MANIFOLD NEPTUNE II (INSTRUMENTS) ×1 IMPLANT
PACK ORTHO EXTREMITY (CUSTOM PROCEDURE TRAY) ×1 IMPLANT
PAD ARMBOARD 7.5X6 YLW CONV (MISCELLANEOUS) ×2 IMPLANT
PADDING CAST ABS COTTON 4X4 ST (CAST SUPPLIES) ×1 IMPLANT
PADDING CAST COTTON 6X4 STRL (CAST SUPPLIES) ×1 IMPLANT
SET HNDPC FAN SPRY TIP SCT (DISPOSABLE) IMPLANT
SPLINT WRIST DYN CLOSE UNIV (SOFTGOODS) IMPLANT
SPONGE T-LAP 18X18 ~~LOC~~+RFID (SPONGE) ×1 IMPLANT
STOCKINETTE IMPERVIOUS 9X36 MD (GAUZE/BANDAGES/DRESSINGS) ×1 IMPLANT
SUT ETHILON 2 0 FS 18 (SUTURE) ×1 IMPLANT
SUT ETHILON 2 0 PSLX (SUTURE) IMPLANT
SUT ETHILON 3 0 PS 1 (SUTURE) IMPLANT
SUT VIC AB 2-0 FS1 27 (SUTURE) ×2 IMPLANT
SWAB CULTURE ESWAB REG 1ML (MISCELLANEOUS) IMPLANT
TOWEL GREEN STERILE (TOWEL DISPOSABLE) ×1 IMPLANT
TOWEL GREEN STERILE FF (TOWEL DISPOSABLE) ×1 IMPLANT
TUBE CONNECTING 12X1/4 (SUCTIONS) ×1 IMPLANT
UNDERPAD 30X36 HEAVY ABSORB (UNDERPADS AND DIAPERS) ×2 IMPLANT
WATER STERILE IRR 1000ML POUR (IV SOLUTION) ×1 IMPLANT
YANKAUER SUCT BULB TIP NO VENT (SUCTIONS) ×1 IMPLANT

## 2022-01-27 NOTE — Transfer of Care (Signed)
Immediate Anesthesia Transfer of Care Note  Patient: Taylor Heath  Procedure(s) Performed: IRRIGATION AND DEBRIDEMENT LEFT HAND (Left: Hand)  Patient Location: PACU  Anesthesia Type:General  Level of Consciousness: drowsy, patient cooperative, and responds to stimulation  Airway & Oxygen Therapy: Patient Spontanous Breathing  Post-op Assessment: Report given to RN and Post -op Vital signs reviewed and stable  Post vital signs: Reviewed and stable  Last Vitals:  Vitals Value Taken Time  BP 159/98 01/27/22 1540  Temp    Pulse 66 01/27/22 1544  Resp 21 01/27/22 1544  SpO2 98 % 01/27/22 1544  Vitals shown include unvalidated device data.  Last Pain:  Vitals:   01/27/22 1233  TempSrc:   PainSc: 0-No pain         Complications: No notable events documented.

## 2022-01-27 NOTE — Progress Notes (Signed)
Orthopedic Tech Progress Note Patient Details:  Taylor Heath [birth year] [id number] Velcro WHO was delivered to OR Ortho Devices Type of Ortho Device: Velcro wrist splint Ortho Device/Splint Interventions: Ordered      Genelle Bal [name] 01/27/2022, 3:40 PM

## 2022-01-27 NOTE — Anesthesia Procedure Notes (Signed)
Procedure Name: LMA Insertion Date/Time: 01/27/2022 2:40 PM  Performed by: Maxine Glenn, CRNAPre-anesthesia Checklist: Patient identified, Emergency Drugs available, Suction available and Patient being monitored Patient Re-evaluated:Patient Re-evaluated prior to induction Oxygen Delivery Method: Circle System Utilized Preoxygenation: Pre-oxygenation with 100% oxygen Induction Type: IV induction Ventilation: Mask ventilation without difficulty LMA: LMA inserted LMA Size: 4.0 Number of attempts: 1 Airway Equipment and Method: Bite block Placement Confirmation: positive ETCO2 Tube secured with: Tape Dental Injury: Teeth and Oropharynx as per pre-operative assessment

## 2022-01-27 NOTE — Anesthesia Preprocedure Evaluation (Addendum)
Anesthesia Evaluation  Patient identified by MRN, date of birth, ID band Patient awake    Reviewed: Allergy & Precautions, H&P , NPO status , Patient's Chart, lab work & pertinent test results  Airway Mallampati: II  TM Distance: >3 FB Neck ROM: Full    Dental no notable dental hx.    Pulmonary neg pulmonary ROS, former smoker   Pulmonary exam normal breath sounds clear to auscultation       Cardiovascular negative cardio ROS Normal cardiovascular exam Rhythm:Regular Rate:Normal     Neuro/Psych   Anxiety     negative neurological ROS  negative psych ROS   GI/Hepatic negative GI ROS, Neg liver ROS,GERD  ,,  Endo/Other  negative endocrine ROS    Renal/GU negative Renal ROS  negative genitourinary   Musculoskeletal negative musculoskeletal ROS (+) Arthritis , Osteoarthritis,    Abdominal   Peds negative pediatric ROS (+)  Hematology negative hematology ROS (+)   Anesthesia Other Findings   Reproductive/Obstetrics negative OB ROS                             Anesthesia Physical Anesthesia Plan  ASA: 3  Anesthesia Plan: General   Post-op Pain Management: Dilaudid IV and Ofirmev IV (intra-op)*   Induction: Intravenous  PONV Risk Score and Plan: 2 and Ondansetron, Midazolam and Treatment may vary due to age or medical condition  Airway Management Planned: LMA  Additional Equipment: None  Intra-op Plan:   Post-operative Plan: Extubation in OR  Informed Consent:   Plan Discussed with:   Anesthesia Plan Comments:        Anesthesia Quick Evaluation

## 2022-01-27 NOTE — Progress Notes (Signed)
Pharmacy Antibiotic Note  Taylor Heath is a 61 y.o. male admitted on 01/27/2022 with  hand abscess .  Pharmacy has been consulted for unasyn dosing.  Pt had wrist extensor tenolysis in 10/23. He was put on abx for hand infection and finished last Thursday. He is here to day for I&D of infection. Cultures sent and unasyn ordered  Scr pending  Plan: Baseline Bmet Unasyn 3g IV q6  Adjust dose if needed  Height: 5\' 10"  (177.8 cm) Weight: 73 kg (161 lb) IBW/kg (Calculated) : 73  Temp (24hrs), Avg:97.9 F (36.6 C), Min:97.5 F (36.4 C), Max:98.4 F (36.9 C)  Recent Labs  Lab 01/27/22 1241  WBC 7.7    CrCl cannot be calculated (Patient's most recent lab result is older than the maximum 21 days allowed.).    Allergies  Allergen Reactions   Meperidine     Unknown reaction     Antimicrobials this admission: 11/29 unasyn>>  Dose adjustments this admission:   Microbiology results: 11/29 abscess>>  12/29, PharmD, BCIDP, AAHIVP, CPP Infectious Disease Pharmacist 01/27/2022 5:37 PM

## 2022-01-27 NOTE — Op Note (Signed)
   Date of Surgery: 01/27/2022  INDICATIONS: Taylor Heath is a 61 y.o.-year-old male with a left hand abscess.  The patient did consent to the procedure after discussion of the risks and benefits.  PREOPERATIVE DIAGNOSIS: Left hand abscess  POSTOPERATIVE DIAGNOSIS: Same.  PROCEDURE: Irrigation and debridement of left hand abscess including skin, tendon, subcutaneous tissue, bone  Debridement type: Excisional Debridement  Side: left  Body Location: left hand   Tools used for debridement: scalpel and rongeur  SURGEON: N. Glee Arvin, M.D.  ASSIST: Starlyn Skeans [place], [place]; necessary for the timely completion of procedure and due to complexity of procedure.  ANESTHESIA:  general  IV FLUIDS AND URINE: See anesthesia.  ESTIMATED BLOOD LOSS: 50 mL.  IMPLANTS: None  DRAINS: None  COMPLICATIONS: see description of procedure.  DESCRIPTION OF PROCEDURE: The patient was brought to the operating room.  The patient had been signed prior to the procedure and this was documented. The patient had the anesthesia placed by the anesthesiologist.  A time-out was performed to confirm that this was the correct patient, site, side and location. The patient did receive antibiotics after intraoperative cultures were obtained.  A tourniquet was placed.  The patient had the operative extremity prepped and draped in the standard surgical fashion.    The previous surgical incision was incised and extended proximally and distally for adequate exposure.  The extensor retinaculum was divided.  There was scant purulence.  Cultures were taken.  Devitalized tissue was excisionally debrided which included the skin and the underlying subcutaneous tissue and some portions of the extensor tendons particularly the extensor tendon to the ring finger was significantly eroded by the infection and the tenosynovitis.  It was difficult to determine whether it was infection or just a massive inflammatory response.  Tenolysis  was performed of the extensor tendon to the long and ring finger with Metzenbaum scissors.  Rondure was used to debride the surrounding and deep tissue to the extensor tendons.  After thorough debridement the tourniquet was then deflated and hemostasis was obtained.  There was excellent vascularity to all of the tissues.  Gross bleeders were cauterized.  3 L of normal saline was irrigated through the wound.  The proximal 1 cm of the incision was closed with vertical mattress 3-0 nylon.  The remaining wound was left open and packed with wet-to-dry dressings.  Sterile dressings were applied and a removable Velcro wrist splint was placed.  Patient tolerated procedure well had no many complications.  Taylor Heath was necessary for opening, closing, retracting, limb positioning and overall facilitation and timely completion of the procedure.  POSTOPERATIVE PLAN: Patient will undergo wet-to-dry dressing changes twice a day and will need repeat washout in 2 days.  He will remain on IV antibiotics.  We will tailor his antibiotics based on intraoperative cultures.  Mayra Reel, MD 6:08 PM

## 2022-01-27 NOTE — Progress Notes (Signed)
Pt seen in room alert/oriented in no apparent distress. Spouse at bedside. Welcome guide/menu provided with instructions. Pt verbalized understanding of instructions. Hospital valuables policy has been discussed with no complaints. Pt orientated to room/equipments. Hospital bed in lowest position with 3 side rails up, call bell/room phone within reach, and all wheels locked.

## 2022-01-27 NOTE — H&P (Signed)
PREOPERATIVE H&P  Chief Complaint: LEFT HAND ABSCESS  HPI: Taylor Heath is a 61 y.o. male who presents for surgical treatment of LEFT HAND ABSCESS.  He denies any changes in medical history.  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   Procedure: RIGHT TOTAL KNEE ARTHROPLASTY;  Surgeon: Tarry Kos, MD;  Location: [facility] OR;  Service: Orthopedics;  Laterality: Right;   Social History   Socioeconomic History   Marital status: Married    Spouse name: Not on file   Number of children: Not on file   Years of education: Not on file   Highest education level: Not on file  Occupational History   Not on file  Tobacco Use   Smoking status: Former    Types: Cigarettes    Quit date: 04/21/2010    Years since quitting: 11.7   Smokeless tobacco: Never  Vaping Use   Vaping Use: Never used  Substance and Sexual Activity   Alcohol use: No   Drug use: No   Sexual activity: Not on file  Other Topics Concern   Not on file  Social History Narrative   Not on file   Social Determinants of Health   Financial Resource Strain: Not on file  Food Insecurity: Not on file  Transportation Needs: Not on file  Physical Activity: Not on file  Stress: Not on file  Social Connections: Not on file   History reviewed. No pertinent family history. Allergies  Allergen Reactions   Meperidine     Unknown reaction    Prior to Admission medications   Medication Sig Start Date End Date Taking? Authorizing Provider  acetaminophen (TYLENOL) 325 MG tablet Take 650 mg by mouth every 6 (six) hours as needed for moderate pain.   Yes [provider]  diclofenac  (VOLTAREN) 75 MG EC tablet Take 75 mg by mouth daily. 09/03/21  Yes [provider]  Multiple Vitamins-Minerals (MULTIVITAMIN PO) Take 1 tablet daily by mouth.   Yes [provider]  Omega-3 Fatty Acids (FISH OIL PO) Take 900 mg by mouth daily.   Yes [provider]  omeprazole (PRILOSEC OTC) 20 MG tablet Take 20 mg daily by mouth.   Yes [provider]  pravastatin (PRAVACHOL) 40 MG tablet Take 40 mg daily by mouth. 11/01/16  Yes [provider]  valACYclovir (VALTREX) 500 MG tablet Take 500 mg daily by mouth. 11/03/16  Yes [provider]  cephALEXin (KEFLEX) 500 MG capsule Take 1 capsule (500 mg total) by mouth 4 (four) times daily. Patient not taking: Reported on 01/26/2022 12/28/21   Persons, [place], [place]  doxycycline (VIBRA-TABS) 100 MG tablet Take 1 tablet (100 mg total) by mouth 2 (two) times daily. Patient not taking: Reported on 01/26/2022 01/01/22   Tarry Kos, MD  doxycycline (VIBRA-TABS) 100 MG tablet Take 1 tablet (100 mg total) by mouth 2 (two) times daily. Patient not taking: Reported on 01/26/2022 01/07/22   Tarry Kos, MD  HYDROcodone-acetaminophen (NORCO) 5-325 MG tablet Take 1 tablet by mouth 3 (three) times daily as needed.  To be taken after surgery Patient not taking: Reported on 01/26/2022 12/14/21   Cristie Hem, PA-C  ondansetron (ZOFRAN) 4 MG tablet Take 1 tablet (4 mg total) by mouth every 8 (eight) hours as needed for nausea or vomiting. Patient not taking: Reported on 01/26/2022 12/14/21   Cristie Hem, PA-C     Positive ROS: All other systems have been reviewed and were otherwise negative with the exception of those mentioned in the HPI and as above.  Physical Exam: General: Alert, no acute distress Cardiovascular: No pedal edema Respiratory: No cyanosis, no use of accessory musculature GI: abdomen soft Skin: No lesions in the area of chief complaint Neurologic: Sensation intact  distally Psychiatric: Patient is competent for consent with normal mood and affect Lymphatic: no lymphedema  MUSCULOSKELETAL: exam stable  Assessment: LEFT HAND ABSCESS  Plan: Plan for Procedure(s): IRRIGATION AND DEBRIDEMENT LEFT HAND  The risks benefits and alternatives were discussed with the patient including but not limited to the risks of nonoperative treatment, versus surgical intervention including infection, bleeding, nerve injury,  blood clots, cardiopulmonary complications, morbidity, mortality, among others, and they were willing to proceed.   Glee Arvin, MD 01/27/2022 1:32 PM

## 2022-01-27 NOTE — Plan of Care (Signed)
  Problem: Education: Goal: Knowledge of General Education information will improve Description: Including pain rating scale, medication(s)/side effects and non-pharmacologic comfort measures Outcome: Progressing   Problem: Clinical Measurements: Goal: Will remain free from infection Outcome: Progressing   Problem: Nutrition: Goal: Adequate nutrition will be maintained Outcome: Progressing   Problem: Elimination: Goal: Will not experience complications related to bowel motility Outcome: Progressing   Problem: Pain Managment: Goal: General experience of comfort will improve Outcome: Progressing

## 2022-01-27 NOTE — Plan of Care (Signed)

## 2022-01-28 ENCOUNTER — Encounter (HOSPITAL_COMMUNITY): Payer: Self-pay | Admitting: Orthopaedic Surgery

## 2022-01-28 LAB — CBC
HCT: 39 % (ref 39.0–52.0)
Hemoglobin: 13.2 g/dL (ref 13.0–17.0)
MCH: 31 pg (ref 26.0–34.0)
MCHC: 33.8 g/dL (ref 30.0–36.0)
MCV: 91.5 fL (ref 80.0–100.0)
Platelets: 276 10*3/uL (ref 150–400)
RBC: 4.26 MIL/uL (ref 4.22–5.81)
RDW: 13.1 % (ref 11.5–15.5)
WBC: 7.1 10*3/uL (ref 4.0–10.5)
nRBC: 0 % (ref 0.0–0.2)

## 2022-01-28 NOTE — Consult Note (Addendum)
WOC consulted for wet to dry dressings for the left hand wound to begin 01/28/22 am. This is wound care the bedside nursing staff will be able to perform.  No need for WOC consultation. Updated orders for this wound care in the chart.    Re consult if needed, will not follow at this time. Thanks  Townes Fuhs [employer], RN,CWOCN, CNS, CWON-AP [phone])

## 2022-01-28 NOTE — Progress Notes (Signed)
   Subjective:  Patient reports soreness with finger extension especially ring finger. No fevers.  Objective:   VITALS:   Vitals:   01/27/22 1736 01/27/22 1927 01/28/22 0400 01/28/22 0800  BP: 128/89 117/72 115/71 115/81  Pulse: 66 76 (!) 58 60  Resp: 18 17 17    Temp: 97.6 F (36.4 C) 98.5 F (36.9 C) 98.2 F (36.8 C) 98.1 F (36.7 C)  TempSrc: Oral Oral Oral Oral  SpO2: 97% 94% 94% 95%  Weight:      Height:        Sensation intact distally Dressings c/d/i   Lab Results  Component Value Date   WBC 7.1 01/28/2022   HGB 13.2 01/28/2022   HCT 39.0 01/28/2022   MCV 91.5 01/28/2022   PLT 276 01/28/2022     Assessment/Plan:  1 Day Post-Op   - start wet to dry dressings today - keep hand elevated - out of bed ad lib - cultures NGTD - continue unasyn - plan for repeat I&D tomorrow - NPO after midnight  01/30/2022 01/28/2022, 8:12 AM

## 2022-01-28 NOTE — Anesthesia Postprocedure Evaluation (Signed)
Anesthesia Post Note  Patient: Taylor Heath  Procedure(s) Performed: IRRIGATION AND DEBRIDEMENT LEFT HAND (Left: Hand)     Patient location during evaluation: PACU Anesthesia Type: General Level of consciousness: awake and alert Pain management: pain level controlled Vital Signs Assessment: post-procedure vital signs reviewed and stable Respiratory status: spontaneous breathing, nonlabored ventilation and respiratory function stable Cardiovascular status: blood pressure returned to baseline and stable Postop Assessment: no apparent nausea or vomiting Anesthetic complications: no   No notable events documented.  Last Vitals:  Vitals:   01/28/22 0400 01/28/22 0800  BP: 115/71 115/81  Pulse: (!) 58 60  Resp: 17   Temp: 36.8 C 36.7 C  SpO2: 94% 95%    Last Pain:  Vitals:   01/28/22 0800  TempSrc: Oral  PainSc:                  Lowella Curb

## 2022-01-28 NOTE — Plan of Care (Signed)

## 2022-01-29 ENCOUNTER — Inpatient Hospital Stay (HOSPITAL_COMMUNITY): Payer: Managed Care, Other (non HMO) | Admitting: Anesthesiology

## 2022-01-29 ENCOUNTER — Encounter (HOSPITAL_COMMUNITY)
Admission: RE | Disposition: A | Discharge status: Home / Self Care | Payer: Self-pay | Source: Home / Self Care | Attending: Orthopaedic Surgery

## 2022-01-29 ENCOUNTER — Other Ambulatory Visit: Payer: Self-pay

## 2022-01-29 ENCOUNTER — Other Ambulatory Visit (HOSPITAL_COMMUNITY): Payer: Self-pay

## 2022-01-29 DIAGNOSIS — L02512 Cutaneous abscess of left hand: Secondary | ICD-10-CM | POA: Diagnosis not present

## 2022-01-29 HISTORY — PX: I & D EXTREMITY: SHX5045

## 2022-01-29 HISTORY — PX: MINOR APPLICATION OF WOUND VAC: SHX6243

## 2022-01-29 LAB — CBC
HCT: 40 % (ref 39.0–52.0)
Hemoglobin: 13.2 g/dL (ref 13.0–17.0)
MCH: 30.1 pg (ref 26.0–34.0)
MCHC: 33 g/dL (ref 30.0–36.0)
MCV: 91.3 fL (ref 80.0–100.0)
Platelets: 294 10*3/uL (ref 150–400)
RBC: 4.38 MIL/uL (ref 4.22–5.81)
RDW: 13.2 % (ref 11.5–15.5)
WBC: 8.7 10*3/uL (ref 4.0–10.5)
nRBC: 0 % (ref 0.0–0.2)

## 2022-01-29 SURGERY — IRRIGATION AND DEBRIDEMENT EXTREMITY
Anesthesia: General | Laterality: Left

## 2022-01-29 MED ORDER — ONDANSETRON HCL 4 MG/2ML IJ SOLN
4.0000 mg | Freq: Four times a day (QID) | INTRAMUSCULAR | Status: DC | PRN
  Administered 2022-01-29: 4 mg via INTRAVENOUS
  Filled 2022-01-29: qty 2

## 2022-01-29 MED ORDER — LIDOCAINE HCL (CARDIAC) PF 100 MG/5ML IV SOSY
PREFILLED_SYRINGE | INTRAVENOUS | Status: DC | PRN
  Administered 2022-01-29: 60 mg via INTRAVENOUS

## 2022-01-29 MED ORDER — FENTANYL CITRATE (PF) 100 MCG/2ML IJ SOLN
INTRAMUSCULAR | Status: AC
  Filled 2022-01-29: qty 2

## 2022-01-29 MED ORDER — LINEZOLID 600 MG PO TABS
600.0000 mg | ORAL_TABLET | Freq: Two times a day (BID) | ORAL | 0 refills | Status: AC
  Filled 2022-01-29: qty 28, 14d supply, fill #0

## 2022-01-29 MED ORDER — OXYCODONE HCL 5 MG PO TABS: 5.0000 mg | ORAL_TABLET | Freq: Once | ORAL | Status: DC | PRN

## 2022-01-29 MED ORDER — PHENYLEPHRINE 80 MCG/ML (10ML) SYRINGE FOR IV PUSH (FOR BLOOD PRESSURE SUPPORT)
PREFILLED_SYRINGE | INTRAVENOUS | Status: DC | PRN
  Administered 2022-01-29: 80 ug via INTRAVENOUS
  Administered 2022-01-29 (×2): 120 ug via INTRAVENOUS

## 2022-01-29 MED ORDER — PROPOFOL 10 MG/ML IV BOLUS
INTRAVENOUS | Status: DC | PRN
  Administered 2022-01-29: 200 mg via INTRAVENOUS

## 2022-01-29 MED ORDER — FENTANYL CITRATE (PF) 250 MCG/5ML IJ SOLN
INTRAMUSCULAR | Status: AC
  Filled 2022-01-29: qty 5

## 2022-01-29 MED ORDER — FENTANYL CITRATE (PF) 250 MCG/5ML IJ SOLN
INTRAMUSCULAR | Status: DC | PRN
  Administered 2022-01-29: 50 ug via INTRAVENOUS
  Administered 2022-01-29: 100 ug via INTRAVENOUS

## 2022-01-29 MED ORDER — SODIUM CHLORIDE 0.9 % IR SOLN
Status: DC | PRN
  Administered 2022-01-29: 3000 mL

## 2022-01-29 MED ORDER — PANTOPRAZOLE SODIUM 40 MG PO TBEC
40.0000 mg | DELAYED_RELEASE_TABLET | Freq: Every day | ORAL | Status: DC
  Administered 2022-01-29 – 2022-01-30 (×2): 40 mg via ORAL
  Filled 2022-01-29 (×2): qty 1

## 2022-01-29 MED ORDER — PROMETHAZINE HCL 25 MG/ML IJ SOLN: 6.2500 mg | INTRAMUSCULAR | Status: DC | PRN

## 2022-01-29 MED ORDER — AMISULPRIDE (ANTIEMETIC) 5 MG/2ML IV SOLN: 10.0000 mg | Freq: Once | INTRAVENOUS | Status: DC | PRN

## 2022-01-29 MED ORDER — FENTANYL CITRATE (PF) 100 MCG/2ML IJ SOLN
25.0000 ug | INTRAMUSCULAR | Status: DC | PRN
  Administered 2022-01-29 (×2): 50 ug via INTRAVENOUS

## 2022-01-29 MED ORDER — VANCOMYCIN HCL IN DEXTROSE 1-5 GM/200ML-% IV SOLN
1000.0000 mg | Freq: Two times a day (BID) | INTRAVENOUS | Status: DC
  Administered 2022-01-30: 1000 mg via INTRAVENOUS
  Filled 2022-01-29 (×3): qty 200

## 2022-01-29 MED ORDER — KETOROLAC TROMETHAMINE 30 MG/ML IJ SOLN: 30.0000 mg | Freq: Once | INTRAMUSCULAR | Status: DC | PRN

## 2022-01-29 MED ORDER — OXYCODONE HCL 5 MG/5ML PO SOLN: 5.0000 mg | Freq: Once | ORAL | Status: DC | PRN

## 2022-01-29 MED ORDER — PROPOFOL 10 MG/ML IV BOLUS
INTRAVENOUS | Status: AC
  Filled 2022-01-29: qty 40

## 2022-01-29 MED ORDER — VANCOMYCIN HCL 1500 MG/300ML IV SOLN
1500.0000 mg | Freq: Once | INTRAVENOUS | Status: AC
  Administered 2022-01-29: 1500 mg via INTRAVENOUS
  Filled 2022-01-29: qty 300

## 2022-01-29 MED ORDER — VALACYCLOVIR HCL 500 MG PO TABS
500.0000 mg | ORAL_TABLET | Freq: Every day | ORAL | Status: DC
  Administered 2022-01-29 – 2022-01-30 (×2): 500 mg via ORAL
  Filled 2022-01-29 (×3): qty 1

## 2022-01-29 MED ORDER — MIDAZOLAM HCL 2 MG/2ML IJ SOLN
INTRAMUSCULAR | Status: AC
  Filled 2022-01-29: qty 2

## 2022-01-29 MED ORDER — PRAVASTATIN SODIUM 40 MG PO TABS
40.0000 mg | ORAL_TABLET | Freq: Every day | ORAL | Status: DC
  Administered 2022-01-29 – 2022-01-30 (×2): 40 mg via ORAL
  Filled 2022-01-29 (×2): qty 1

## 2022-01-29 MED ORDER — LACTATED RINGERS IV SOLN: INTRAVENOUS | Status: DC | PRN

## 2022-01-29 SURGICAL SUPPLY — 52 items
BAG COUNTER SPONGE SURGICOUNT (BAG) ×2 IMPLANT
BNDG COHESIVE 4X5 TAN STRL (GAUZE/BANDAGES/DRESSINGS) ×2 IMPLANT
BNDG COHESIVE 6X5 TAN STRL LF (GAUZE/BANDAGES/DRESSINGS) ×4 IMPLANT
BNDG ELASTIC 3X5.8 VLCR STR LF (GAUZE/BANDAGES/DRESSINGS) IMPLANT
BNDG GAUZE DERMACEA FLUFF 4 (GAUZE/BANDAGES/DRESSINGS) ×2 IMPLANT
COVER SURGICAL LIGHT HANDLE (MISCELLANEOUS) ×2 IMPLANT
CUFF TOURN SGL QUICK 24 (TOURNIQUET CUFF)
CUFF TOURN SGL QUICK 34 (TOURNIQUET CUFF)
CUFF TOURN SGL QUICK 42 (TOURNIQUET CUFF) IMPLANT
CUFF TRNQT CYL 24X4X16.5-23 (TOURNIQUET CUFF) IMPLANT
CUFF TRNQT CYL 34X4.125X (TOURNIQUET CUFF) IMPLANT
DRAPE U-SHAPE 47X51 STRL (DRAPES) ×2 IMPLANT
DURAPREP 26ML APPLICATOR (WOUND CARE) ×2 IMPLANT
ELECT REM PT RETURN 9FT ADLT (ELECTROSURGICAL)
ELECTRODE REM PT RTRN 9FT ADLT (ELECTROSURGICAL) IMPLANT
GAUZE PAD ABD 8X10 STRL (GAUZE/BANDAGES/DRESSINGS) ×2 IMPLANT
GAUZE SPONGE 4X4 12PLY STRL (GAUZE/BANDAGES/DRESSINGS) ×4 IMPLANT
GAUZE XEROFORM 5X9 LF (GAUZE/BANDAGES/DRESSINGS) ×2 IMPLANT
GLOVE BIOGEL PI IND STRL 7.0 (GLOVE) ×4 IMPLANT
GLOVE BIOGEL PI IND STRL 7.5 (GLOVE) ×2 IMPLANT
GLOVE ECLIPSE 7.0 STRL STRAW (GLOVE) ×2 IMPLANT
GLOVE SKINSENSE STRL SZ7.5 (GLOVE) ×4 IMPLANT
GLOVE SURG SYN 7.5  E (GLOVE) ×4
GLOVE SURG SYN 7.5 E (GLOVE) ×4 IMPLANT
GLOVE SURG SYN 7.5 PF PI (GLOVE) ×4 IMPLANT
GLOVE SURG UNDER POLY LF SZ7 (GLOVE) ×38 IMPLANT
GLOVE SURG UNDER POLY LF SZ7.5 (GLOVE) ×8 IMPLANT
GOWN STRL REIN XL XLG (GOWN DISPOSABLE) ×4 IMPLANT
HANDPIECE INTERPULSE COAX TIP (DISPOSABLE)
KIT BASIN OR (CUSTOM PROCEDURE TRAY) ×2 IMPLANT
KIT DRSG PREVENA PLUS 7DAY 125 (MISCELLANEOUS) IMPLANT
KIT PREVENA INCISION MGT 13 (CANNISTER) IMPLANT
KIT TURNOVER KIT B (KITS) ×2 IMPLANT
MANIFOLD NEPTUNE II (INSTRUMENTS) ×2 IMPLANT
PACK ORTHO EXTREMITY (CUSTOM PROCEDURE TRAY) ×2 IMPLANT
PAD ARMBOARD 7.5X6 YLW CONV (MISCELLANEOUS) ×4 IMPLANT
PADDING CAST ABS COTTON 4X4 ST (CAST SUPPLIES) ×2 IMPLANT
PADDING CAST COTTON 6X4 STRL (CAST SUPPLIES) ×2 IMPLANT
SET HNDPC FAN SPRY TIP SCT (DISPOSABLE) IMPLANT
SPONGE T-LAP 18X18 ~~LOC~~+RFID (SPONGE) ×2 IMPLANT
STOCKINETTE IMPERVIOUS 9X36 MD (GAUZE/BANDAGES/DRESSINGS) ×2 IMPLANT
SUT ETHILON 2 0 FS 18 (SUTURE) ×2 IMPLANT
SUT ETHILON 2 0 PSLX (SUTURE) IMPLANT
SUT ETHILON 3 0 FSL (SUTURE) IMPLANT
SUT VIC AB 2-0 FS1 27 (SUTURE) ×4 IMPLANT
SWAB CULTURE ESWAB REG 1ML (MISCELLANEOUS) IMPLANT
TOWEL GREEN STERILE (TOWEL DISPOSABLE) ×2 IMPLANT
TOWEL GREEN STERILE FF (TOWEL DISPOSABLE) ×2 IMPLANT
TUBE CONNECTING 12X1/4 (SUCTIONS) ×2 IMPLANT
UNDERPAD 30X36 HEAVY ABSORB (UNDERPADS AND DIAPERS) ×4 IMPLANT
WATER STERILE IRR 1000ML POUR (IV SOLUTION) ×2 IMPLANT
YANKAUER SUCT BULB TIP NO VENT (SUCTIONS) ×2 IMPLANT

## 2022-01-29 NOTE — Transfer of Care (Signed)
Immediate Anesthesia Transfer of Care Note  Patient: Taylor Heath  Procedure(s) Performed: REPEAT IRRIGATION AND DEBRIDEMENT OF HAND (Left) APPLICATION OF WOUND VAC  Patient Location: PACU  Anesthesia Type:General  Level of Consciousness: awake, alert , and oriented  Airway & Oxygen Therapy: Patient Spontanous Breathing  Post-op Assessment: Report given to RN, Post -op Vital signs reviewed and stable, and Patient moving all extremities  Post vital signs: Reviewed and stable  Last Vitals:  Vitals Value Taken Time  BP 155/96 01/29/22 0807  Temp    Pulse 60 01/29/22 0810  Resp 11 01/29/22 0810  SpO2 98 % 01/29/22 0810  Vitals shown include unvalidated device data.  Last Pain:  Vitals:   01/29/22 0618  TempSrc: Oral  PainSc:       Patients Stated Pain Goal: 3 (01/27/22 1615)  Complications: No notable events documented.

## 2022-01-29 NOTE — TOC Progression Note (Signed)
Discharge medication (1) are being stored in the main pharmacy on the ground floor until patient is ready for discharge.   

## 2022-01-29 NOTE — Anesthesia Preprocedure Evaluation (Addendum)
Anesthesia Evaluation  Patient identified by MRN, date of birth, ID band Patient awake    Reviewed: Allergy & Precautions, NPO status , Patient's Chart, lab work & pertinent test results  Airway Mallampati: II  TM Distance: >3 FB Neck ROM: Full    Dental no notable dental hx.    Pulmonary former smoker   Pulmonary exam normal        Cardiovascular negative cardio ROS Normal cardiovascular exam     Neuro/Psych   Anxiety      Neuromuscular disease    GI/Hepatic Neg liver ROS,GERD  Medicated and Controlled,,  Endo/Other  negative endocrine ROS    Renal/GU negative Renal ROS     Musculoskeletal  (+) Arthritis ,    Abdominal   Peds  Hematology negative hematology ROS (+)   Anesthesia Other Findings left hand abscess  Reproductive/Obstetrics                             Anesthesia Physical Anesthesia Plan  ASA: 2  Anesthesia Plan: General   Post-op Pain Management:    Induction: Intravenous  PONV Risk Score and Plan: 2 and Ondansetron, Dexamethasone, Midazolam and Treatment may vary due to age or medical condition  Airway Management Planned: LMA  Additional Equipment:   Intra-op Plan:   Post-operative Plan: Extubation in OR  Informed Consent: I have reviewed the patients History and Physical, chart, labs and discussed the procedure including the risks, benefits and alternatives for the proposed anesthesia with the patient or authorized representative who has indicated his/her understanding and acceptance.     Dental advisory given  Plan Discussed with: CRNA  Anesthesia Plan Comments:        Anesthesia Quick Evaluation

## 2022-01-29 NOTE — Brief Op Note (Signed)
01/29/2022  7:56 AM  PATIENT:  Taylor Heath  61 y.o. male  PRE-OPERATIVE DIAGNOSIS:  left hand abscess status post I&D  POST-OPERATIVE DIAGNOSIS:  same  PROCEDURE:  Procedure(s): REPEAT IRRIGATION AND DEBRIDEMENT OF HAND (Left)  SURGEON:  Surgeon(s) and Role:    * Tarry Kos, MD - Primary  PHYSICIAN ASSISTANT:   ASSISTANTSDub Mikes, PA-C  ANESTHESIA:   general  EBL:  5 mL   BLOOD ADMINISTERED:none  DRAINS: incisional vac  COUNTS:  YES  TOURNIQUET:  * No tourniquets in log *  DICTATION: .Dragon Dictation  PLAN OF CARE:  readmit to floor  PATIENT DISPOSITION:  PACU - hemodynamically stable.   Delay start of Pharmacological VTE agent (>24hrs) due to surgical blood loss or risk of bleeding: no

## 2022-01-29 NOTE — Op Note (Signed)
   Date of Surgery: 01/29/2022  INDICATIONS: Taylor Heath is a 61 y.o.-year-old male with a left hand infection.  The patient did consent to the procedure after discussion of the risks and benefits.  PREOPERATIVE DIAGNOSIS: Left hand infection  POSTOPERATIVE DIAGNOSIS: Same.  PROCEDURE:  Irrigation debridement of left hand wound with closure of previous surgical wound Application of incisional wound VAC  Debridement type: Excisional Debridement  Side: left  Body Location: Hand  Tools used for debridement: scissors and rongeur  SURGEON: N. Glee Arvin, M.D.  ASSIST: Starlyn Skeans [place], [place]; necessary for the timely completion of procedure and due to complexity of procedure.  ANESTHESIA:  general  IV FLUIDS AND URINE: See anesthesia.  ESTIMATED BLOOD LOSS: Minimal mL.  IMPLANTS: None  DRAINS: None  COMPLICATIONS: see description of procedure.  DESCRIPTION OF PROCEDURE: The patient was brought to the operating room.  The patient had been signed prior to the procedure and this was documented. The patient had the anesthesia placed by the anesthesiologist.  A time-out was performed to confirm that this was the correct patient, site, side and location. The patient did receive antibiotics prior to the incision and was re-dosed during the procedure as needed at indicated intervals.  The patient had the operative extremity prepped and draped in the standard surgical fashion.    Overall the wound looked healthy and viable with beefy red cannulation tissue without any evidence of worsening infection.  There were couple areas in the subcutaneous tissue that was sharply debrided with a rondure.  There was good punctate bleeding to the tissue.  3 L of normal saline was then irrigated through the wound.  I felt that the wound was appropriate for closure.  Secondary closure of the surgical wound was then performed using 3-0 nylon in a vertical mattress fashion.  Incisional wound VAC was then  placed over the incision.  Portable Praveena unit was attached.  Patient tolerated the procedure well had no many complications.  Tessa Lerner was necessary for opening, closing, retracting, limb positioning and overall facilitation and timely completion of the procedure.  POSTOPERATIVE PLAN: Patient will be admitted back to his room.  We will consult infectious disease for antibiotic recommendation.  We will have him come back to the office in a week for wound VAC removal.  Plan is to discharge tomorrow.  Mayra Reel, MD 3:31 PM

## 2022-01-29 NOTE — Consult Note (Signed)
[facility]    Date of Admission:  01/27/2022     Reason for Consult: staph aureus tenosynovitis/surgical site infection    Referring Provider: Gershon Mussel   Abx: 11/29-c amp/sulbactam  10/30-11/29 3 days cephalexin followed by doxycycline   Assessment: 61 yo male hx left hip replacement 2012, right total knee replacement 2018, rheumatoid arthritis not on dmards/pred or any specific RA treatment, bilateral carpal tunnel syndrome, who has had left wrist extensor tendon repair/tenolysis for concern of impending tendon rupture, complicated by early surgical site infection   No sepsis outside of localized wrist soft tissue infection  12/01 S/p repeat left hand abscess I&D 11/29 s/p I&D left hand abscess/extensor tendons -- cx staph aureus pending susceptibility  I discussed with Dr Roda Shutters. No more surgery is planned. He had to debride down to the metacarpal level. There was no wrist joint involvement. While there was sign of gross metacarpal involvement, given the significant inflammatory change of RA, organism being staph aureus, and depth of infection, will plan to treat for 6 weeks  Plan: Pending susceptibility will switch abx to vancomycin When he id discharged we could change to appropriate oral abx if available, for 6 weeks I will see him in ID clinic for follow up as below Discussed with primary team   Clinic Follow Up Appt: 12/12 @ 1045 with Dr Renold Don  @  [facility] [address], [place], [place] [address] Phone: [phone]      I spent 75 minute reviewing data/chart, and coordinating care and >50% direct face to face time providing counseling/discussing diagnostics/treatment plan with patient       ------------------------------------------------ Principal Problem:   Abscess of dorsum of left hand    HPI: Taylor Heath is a 61 y.o. male hx left hip replacement 2012, right total knee replacement 2018, rheumatoid  arthritis not on dmards/pred or any specific RA treatment, bilateral carpal tunnel syndrome, who has had left wrist extensor tendon repair/tenolysis for concern of impending tendon rupture, complicated by early surgical site infection   He has been followed by dr Glee Arvin for various orthopedics issue including right knee arthroplasty, and bilateral carpal tunnel glucocorticoid injection for CTS   He had recent procedures: carpal tunnel methylpred injection 9/19 Patient also has had left extensor wrist soreness. And an mri 9/28 showed extensive/complex extensor teninopathy concerning for rupture He underwent tenolysis/extensor tendon repair of the left wrist on 10/19.   On 10/26 post-op ortho clinic visit the wound appears to be healing well without dehiscence/drainage. There is noted mild post-op swelling, and patient noted without complaint, and he was advised for continued splint/avoid rigorous activity with the wrist  On 10/30 he had another post-op clinic visit after moving plants inside his house and noted 1 stitch broken, with serous discharge. Denies fever/chill. He was given short course of cephalexin. There was a picture on epic and that shows small dehiscence at the broken suture but no redness or purulence  On 11/03 has another ortho clinic visit with stable finding from 10/30. Abx changed to doxy. Patient continues to have slight serous drainage without other signs of infection on 11/09 visit and he continues on doxy, however on 11/28 follow up, there was worse swelling/warmth/redness including purulence discharge concerning for surgical site infection. He was therefore admitted 11/29 for planned I&D  There has been no documented fever here He has had no leukocytosis  He is s/p: 11/29 s/p  I&D left hand abscess/extensor tendons -- culture is growing staph aureus -- sensitivity is pending 12/01 S/p repeat left hand abscess I&D       History reviewed. No pertinent family  history.  Social History   Tobacco Use   Smoking status: Former    Types: Cigarettes    Quit date: 04/21/2010    Years since quitting: 11.7   Smokeless tobacco: Never  Vaping Use   Vaping Use: Never used  Substance Use Topics   Alcohol use: No   Drug use: No    Allergies  Allergen Reactions   Meperidine     Unknown reaction     Review of Systems: ROS All Other ROS was negative, except mentioned above   Past Medical History:  Diagnosis Date   Anxiety    Arthritis    GERD (gastroesophageal reflux disease)    History of kidney stones    Pneumonia        Scheduled Meds:  fentaNYL       traMADol  50 mg Oral Q6H   Continuous Infusions:  ampicillin-sulbactam (UNASYN) IV 3 g (01/29/22 0418)   PRN Meds:.acetaminophen, fentaNYL, HYDROcodone-acetaminophen, HYDROcodone-acetaminophen, morphine injection   OBJECTIVE: Blood pressure (!) 156/83, pulse 69, temperature 98.7 F (37.1 C), resp. rate 12, height 5\' 10"  (1.778 m), weight 73 kg, SpO2 96 %.  Physical Exam  General/constitutional: no distress, pleasant HEENT: Normocephalic, PER, Conj Clear, EOMI, Oropharynx clear Neck supple CV: rrr no mrg Lungs: clear to auscultation, normal respiratory effort Abd: Soft, Nontender Ext: no edema Skin: No Rash Neuro: nonfocal MSK: left wrist dressing intact; wound vac also present    Lab Results Lab Results  Component Value Date   WBC 8.7 01/29/2022   HGB 13.2 01/29/2022   HCT 40.0 01/29/2022   MCV 91.3 01/29/2022   PLT 294 01/29/2022    Lab Results  Component Value Date   CREATININE 0.91 01/27/2022   BUN 14 01/27/2022   NA 138 01/27/2022   K 4.1 01/27/2022   CL 103 01/27/2022   CO2 24 01/27/2022    Lab Results  Component Value Date   ALT 48 01/19/2017   AST 35 01/19/2017   ALKPHOS 65 01/19/2017   BILITOT 1.3 (H) 01/19/2017      Microbiology: Recent Results (from the past 240 hour(s))  Surgical pcr screen     Status: None   Collection Time:  01/27/22  1:04 PM   Specimen: Nasal Mucosa; Nasal Swab  Result Value Ref Range Status   MRSA, PCR NEGATIVE NEGATIVE Final   Staphylococcus aureus NEGATIVE NEGATIVE Final    Comment: (NOTE) The Xpert SA Assay (FDA approved for NASAL specimens in patients 7 years of age and older), is one component of a comprehensive surveillance program. It is not intended to diagnose infection nor to guide or monitor treatment. Performed at [facility], [address], [place], [place]   Aerobic/Anaerobic Culture w Gram Stain (surgical/deep wound)     Status: None (Preliminary result)   Collection Time: 01/27/22  3:02 PM   Specimen: Hand, Left; Body Fluid  Result Value Ref Range Status   Specimen Description WOUND LEFT HAND  Final   Special Requests SAMPLE A  Final   Gram Stain   Final    RARE WBC PRESENT, PREDOMINANTLY PMN NO ORGANISMS SEEN    Culture   Final    FEW STAPHYLOCOCCUS AUREUS CULTURE REINCUBATED FOR BETTER GROWTH Performed at [facility], [address],  [place], [place] [address]    Report Status PENDING  Incomplete  Aerobic/Anaerobic Culture w Gram Stain (surgical/deep wound)     Status: None (Preliminary result)   Collection Time: 01/27/22  3:04 PM   Specimen: Hand, Left; Abscess  Result Value Ref Range Status   Specimen Description TISSUE LEFT HAND  Final   Special Requests SAMPLE B  Final   Gram Stain   Final    RARE WBC PRESENT, PREDOMINANTLY PMN NO ORGANISMS SEEN    Culture   Final    NO GROWTH < 24 HOURS Performed at [facility], [address] [place], [place] [address]    Report Status PENDING  Incomplete     Serology:    Imaging: If present, new imagings (plain films, ct scans, and mri) have been personally visualized and interpreted; radiology reports have been reviewed. Decision making incorporated into the Impression / Recommendations.  Previous to admission  9/28 mri left wrist 1. Moderate to high-grade fourth and  fifth, moderate second and third, and mild to moderate 6th dorsal extensor compartment tenosynovitis. There is scattered debris and some thin internal septations within the tendon sheath fluid indicating mild complexity. 2. Multiple interstitial tears within the fourth and fifth extensor compartment tendons. Moderate extensor carpi ulnaris tendinosis with small interstitial tears. 3. Possible partial-thickness tear of the distal peripheral triangular fibrocartilage complex. 4. Possible partial-thickness tear of the proximal aspect of the lunotriquetral ligament. 5. Moderate to severe triscaphe, moderate thumb carpometacarpal, and moderate lunotriquetral cartilage degenerative changes. Scattered carpal subchondral cysts.    Raymondo Band, MD [facility] [phone] pager    01/29/2022, 9:41 AM

## 2022-01-29 NOTE — Plan of Care (Signed)
Id brief update   Dr Roda Shutters plans discharge tomorrow AM   -we can arrange 2 weeks linezolid 600 mg po bid -when patient sees me in clinic on 12/12 I will extend his rx with other appropriate/less toxic antibiotics based on susceptibility -discussed with dr Roda Shutters

## 2022-01-29 NOTE — Progress Notes (Signed)
Pharmacy Antibiotic Note  Taylor Heath is a 61 y.o. male admitted on 01/27/2022 with left hand abscess.   Started on Unasyn on 11/29.  Today she underwent I&D left hand.   Left hand tissue and wound  cultures growing staph aureus.  Surgical PCR screen 11/29  Negative for MRSA.  01/29/22  Ortho has discontinued Unasyn and consulted Pharmacy for Vancomycin dosing for tenosynovitis / surgical infection.   WBC 7.1>8.7, afebrile,  SCr 0.91  11/29 unasyn>>12/1 11/30 Vanc>>  11/29 abscess left hand Cx:   SA , sens pending 11/29  fluid wound left hand Cx: SA sens pending 11/29  PCR : MRSA neg; SA neg  Plan: Vancomycin 1500 mg IV x1 then 1000 mg IV  Q 12 hrs. Goal AUC 400-550. Expected AUC: 491  SCr used: 0.91  Check BMP in AM Monitor clinical progress, renal function, cultures Check SS vanc levels per protocol if needed.   Height: 5\' 10"  (177.8 cm) Weight: 73 kg (161 lb) IBW/kg (Calculated) : 73  Temp (24hrs), Avg:98.3 F (36.8 C), Min:97.8 F (36.6 C), Max:98.7 F (37.1 C)  Recent Labs  Lab 01/27/22 1241 01/27/22 1802 01/28/22 0617 01/29/22 0354  WBC 7.7  --  7.1 8.7  CREATININE  --  0.91  --   --     Estimated Creatinine Clearance: 88 mL/min (by C-G formula based on SCr of 0.91 mg/dL).    Allergies  Allergen Reactions   Meperidine     Unknown reaction     Antimicrobials this admission: Unasyn 11/29>>12/1 Vancomycin 11/30>>   Dose adjustments this admission:   Microbiology results: 11/29 abscess left hand Cx:   SA , sens pending 11/29  fluid wound left hand Cx: SA sens pending 11/29  PCR : MRSA neg; SA neg   Thank you for allowing pharmacy to be a part of this patient's care.  12/29, RPh Clinical Pharmacist [phone] 01/29/2022 10:59 AM  Please check AMION for all [facility] Pharmacy phone numbers After 10:00 PM, call Main Pharmacy [phone]

## 2022-01-29 NOTE — H&P (Signed)

## 2022-01-29 NOTE — TOC Transition Note (Signed)
Discharge medications (1) are being stored in the main pharmacy on the ground floor until patient is ready for discharge.   

## 2022-01-29 NOTE — Anesthesia Procedure Notes (Signed)
Procedure Name: LMA Insertion Date/Time: 01/29/2022 7:18 AM  Performed by: Jodell Cipro, CRNAPre-anesthesia Checklist: Patient identified, Emergency Drugs available, Suction available and Patient being monitored Patient Re-evaluated:Patient Re-evaluated prior to induction Oxygen Delivery Method: Circle System Utilized Preoxygenation: Pre-oxygenation with 100% oxygen Induction Type: IV induction Ventilation: Mask ventilation without difficulty LMA: LMA inserted LMA Size: 5.0 Number of attempts: 1 Airway Equipment and Method: Bite block Placement Confirmation: positive ETCO2 Tube secured with: Tape Dental Injury: Teeth and Oropharynx as per pre-operative assessment

## 2022-01-29 NOTE — Plan of Care (Signed)

## 2022-01-30 ENCOUNTER — Other Ambulatory Visit: Payer: Self-pay | Admitting: Orthopaedic Surgery

## 2022-01-30 LAB — CBC
HCT: 41.1 % (ref 39.0–52.0)
Hemoglobin: 14.1 g/dL (ref 13.0–17.0)
MCH: 30.9 pg (ref 26.0–34.0)
MCHC: 34.3 g/dL (ref 30.0–36.0)
MCV: 90.1 fL (ref 80.0–100.0)
Platelets: 290 10*3/uL (ref 150–400)
RBC: 4.56 MIL/uL (ref 4.22–5.81)
RDW: 12.9 % (ref 11.5–15.5)
WBC: 6.9 10*3/uL (ref 4.0–10.5)
nRBC: 0 % (ref 0.0–0.2)

## 2022-01-30 LAB — BASIC METABOLIC PANEL
Anion gap: 10 (ref 5–15)
BUN: 11 mg/dL (ref 8–23)
CO2: 24 mmol/L (ref 22–32)
Calcium: 9.2 mg/dL (ref 8.9–10.3)
Chloride: 103 mmol/L (ref 98–111)
Creatinine, Ser: 0.82 mg/dL (ref 0.61–1.24)
GFR, Estimated: >60 mL/min (ref >60)
Glucose, Bld: 139 mg/dL — ABNORMAL HIGH (ref 70–99)
Potassium: 3.8 mmol/L (ref 3.5–5.1)
Sodium: 137 mmol/L (ref 135–145)

## 2022-01-30 MED ORDER — HYDROCODONE-ACETAMINOPHEN 5-325 MG PO TABS: 1.0000 | ORAL_TABLET | Freq: Three times a day (TID) | ORAL | 0 refills | Status: DC | PRN

## 2022-01-30 NOTE — Anesthesia Postprocedure Evaluation (Signed)
Anesthesia Post Note  Patient: KLINT LEZCANO  Procedure(s) Performed: REPEAT IRRIGATION AND DEBRIDEMENT OF HAND (Left) APPLICATION OF WOUND VAC     Patient location during evaluation: PACU Anesthesia Type: General Level of consciousness: awake Pain management: pain level controlled Vital Signs Assessment: post-procedure vital signs reviewed and stable Respiratory status: spontaneous breathing, nonlabored ventilation and respiratory function stable Cardiovascular status: blood pressure returned to baseline and stable Postop Assessment: no apparent nausea or vomiting Anesthetic complications: no  No notable events documented.  Last Vitals:  Vitals:   01/29/22 1957 01/30/22 0400  BP: (!) 131/98 125/82  Pulse: (!) 58 62  Resp: 18 18  Temp: 36.7 C 36.8 C  SpO2: 96% 98%    Last Pain:  Vitals:   01/30/22 0400  TempSrc: Oral  PainSc:                  Reginna Sermeno P Jalon Blackwelder

## 2022-01-30 NOTE — TOC Transition Note (Signed)
Transition of Care [facility]) - CM/SW Discharge Note   Patient Details  Name: Taylor Heath MRN: [id number] Date of Birth: May 13, 1960  [facility] CM/SW Contact:  Bess Kinds, RN Phone Number: [phone] 01/30/2022, 10:24 AM   Clinical Narrative:     Patient to transition home today. No TOC needs identified at this time.   Final next level of care: Home/Self Care Barriers to Discharge: No Barriers Identified   Patient Goals and CMS Choice        Discharge Placement                       Discharge Plan and Services                                     Social Determinants of Health (SDOH) Interventions     Readmission Risk Interventions     No data to display

## 2022-01-30 NOTE — Discharge Summary (Signed)
Patient ID: Taylor Heath MRN: [id number] DOB/AGE: [birth year] 61 y.o.  Admit date: 01/27/2022 Discharge date: 01/30/2022  Admission Diagnoses:  Abscess of dorsum of left hand  Discharge Diagnoses:  Principal Problem:   Abscess of dorsum of left hand   Past Medical History:  Diagnosis Date   Anxiety    Arthritis    GERD (gastroesophageal reflux disease)    History of kidney stones    Pneumonia     Surgeries: Procedure(s): REPEAT IRRIGATION AND DEBRIDEMENT OF HAND APPLICATION OF WOUND VAC on 01/29/2022   Consultants (if any):   Discharged Condition: Improved  Hospital Course: CINCH ORMOND is an 61 y.o. male who was admitted 01/27/2022 with a diagnosis of Abscess of dorsum of left hand and went to the operating room on 01/29/2022 and underwent the above named procedures.    He was given perioperative antibiotics:  Anti-infectives (From admission, onward)    Start     Dose/Rate Route Frequency Ordered Stop   01/30/22 0000  vancomycin (VANCOCIN) IVPB 1000 mg/200 mL premix        1,000 mg 200 mL/hr over 60 Minutes Intravenous Every 12 hours 01/29/22 1055     01/29/22 2000  valACYclovir (VALTREX) tablet 500 mg        500 mg Oral Daily 01/29/22 1729     01/29/22 1145  vancomycin (VANCOREADY) IVPB 1500 mg/300 mL        1,500 mg 150 mL/hr over 120 Minutes Intravenous  Once 01/29/22 1055 01/29/22 1330   01/29/22 0000  linezolid (ZYVOX) 600 MG tablet       Note to Pharmacy: Plan for discharge 12/2   600 mg Oral 2 times daily 01/29/22 1452 02/12/22 2359   01/27/22 1815  Ampicillin-Sulbactam (UNASYN) 3 g in sodium chloride 0.9 % 100 mL IVPB  Status:  Discontinued        3 g 200 mL/hr over 30 Minutes Intravenous Every 6 hours 01/27/22 1726 01/29/22 1001   01/27/22 1515  vancomycin (VANCOCIN) 1,000 mg in sodium chloride 0.9 % 250 mL IVPB  Status:  Discontinued       Note to Pharmacy: Send to OR for surgery   1,000 mg 250 mL/hr over 60 Minutes Intravenous  Once 01/27/22  1501 01/27/22 1644   01/27/22 1515  vancomycin (VANCOCIN) IVPB 1000 mg/200 mL premix  Status:  Discontinued        1,000 mg 200 mL/hr over 60 Minutes Intravenous To [facility]Stay [facility] 01/27/22 1502 01/27/22 1656     .  He was given sequential compression devices, early ambulation, and appropriate chemoprophylaxis for DVT prophylaxis.  He benefited maximally from the hospital stay and there were no complications.    Recent vital signs:  Vitals:   01/29/22 1957 01/30/22 0400  BP: (!) 131/98 125/82  Pulse: (!) 58 62  Resp: 18 18  Temp: 98.1 F (36.7 C) 98.2 F (36.8 C)  SpO2: 96% 98%    Recent laboratory studies:  Lab Results  Component Value Date   HGB 14.1 01/30/2022   HGB 13.2 01/29/2022   HGB 13.2 01/28/2022   Lab Results  Component Value Date   WBC 6.9 01/30/2022   PLT 290 01/30/2022   Lab Results  Component Value Date   INR 0.96 01/19/2017   Lab Results  Component Value Date   NA 137 01/30/2022   K 3.8 01/30/2022   CL 103 01/30/2022   CO2 24 01/30/2022   BUN 11 01/30/2022   CREATININE 0.82  01/30/2022   GLUCOSE 139 (H) 01/30/2022    Discharge Medications:   Allergies as of 01/30/2022       Reactions   Meperidine    Unknown reaction         Medication List     STOP taking these medications    cephALEXin 500 MG capsule Commonly known as: KEFLEX   doxycycline 100 MG tablet Commonly known as: VIBRA-TABS       TAKE these medications    acetaminophen 325 MG tablet Commonly known as: TYLENOL Take 650 mg by mouth every 6 (six) hours as needed for moderate pain.   diclofenac 75 MG EC tablet Commonly known as: VOLTAREN Take 75 mg by mouth daily.   FISH OIL PO Take 900 mg by mouth daily.   HYDROcodone-acetaminophen 5-325 MG tablet Commonly known as: Norco Take 1 tablet by mouth 3 (three) times daily as needed. To be taken after surgery   linezolid 600 MG tablet Commonly known as: ZYVOX Take 1 tablet (600 mg total) by mouth 2 (two)  times daily for 14 days.   MULTIVITAMIN PO Take 1 tablet daily by mouth.   omeprazole 20 MG tablet Commonly known as: PRILOSEC OTC Take 20 mg daily by mouth.   ondansetron 4 MG tablet Commonly known as: Zofran Take 1 tablet (4 mg total) by mouth every 8 (eight) hours as needed for nausea or vomiting.   pravastatin 40 MG tablet Commonly known as: PRAVACHOL Take 40 mg daily by mouth.   valACYclovir 500 MG tablet Commonly known as: VALTREX Take 500 mg daily by mouth.        Diagnostic Studies: No results found.  Disposition: Discharge disposition: 01-Home or Self Care       Discharge Instructions     Call MD / Call 911   Complete by: As directed    If you experience chest pain or shortness of breath, CALL 911 and be transported to the hospital emergency room.  If you develope a fever above 101.5 F, pus (white drainage) or increased drainage or redness at the wound, or calf pain, call your surgeon's office.   Constipation Prevention   Complete by: As directed    Drink plenty of fluids.  Prune juice may be helpful.  You may use a stool softener, such as Colace (over the counter) 100 mg twice a day.  Use MiraLax (over the counter) for constipation as needed.   Driving restrictions   Complete by: As directed    No driving while taking narcotic pain meds.   Increase activity slowly as tolerated   Complete by: As directed    Post-operative opioid taper instructions:   Complete by: As directed    POST-OPERATIVE OPIOID TAPER INSTRUCTIONS: It is important to wean off of your opioid medication as soon as possible. If you do not need pain medication after your surgery it is ok to stop day one. Opioids include: Codeine, Hydrocodone(Norco, Vicodin), Oxycodone(Percocet, oxycontin) and hydromorphone amongst others.  Long term and even short term use of opiods can cause: Increased pain response Dependence Constipation Depression Respiratory depression And more.  Withdrawal  symptoms can include Flu like symptoms Nausea, vomiting And more Techniques to manage these symptoms Hydrate well Eat regular healthy meals Stay active Use relaxation techniques(deep breathing, meditating, yoga) Do Not substitute Alcohol to help with tapering If you have been on opioids for less than two weeks and do not have pain than it is ok to stop all together.  Plan to  wean off of opioids This plan should start within one week post op of your joint replacement. Maintain the same interval or time between taking each dose and first decrease the dose.  Cut the total daily intake of opioids by one tablet each day Next start to increase the time between doses. The last dose that should be eliminated is the evening dose.           Follow-up Information     Raymondo Band, MD Follow up on 02/09/2022.   Specialty: Infectious Diseases Why: follow up appointment with Dr. Renold Don of infectious disease clinic. Contact information: [address] [place] [address] [phone]         Tarry Kos, MD Follow up in 1 week(s).   Specialty: Orthopedic Surgery Why: For suture removal, For wound re-check Contact information: [address] [place] [address] [phone]                  Signed: Glee Arvin 01/30/2022, 9:12 AM

## 2022-01-30 NOTE — Discharge Instructions (Addendum)
   Postoperative instructions:  Weightbearing instructions: don't lift more than 2 lbs with left hand  Keep your dressing and/or splint clean and dry at all times.  Do not change the surgical dressing until your follow up appointment with Dr. Roda Shutters.  Incision instructions:  Do not soak your incision for 3 weeks after surgery.  If the incision gets wet, pat dry and do not scrub the incision.  Pain control:  You have been given a prescription to be taken as directed for post-operative pain control.  In addition, elevate the operative extremity above the heart at all times to prevent swelling and throbbing pain.  Take over-the-counter Colace, 100mg  by mouth twice a day while taking narcotic pain medications to help prevent constipation.  Follow up appointments: 1) 14 days for suture removal and wound check. 2) Dr. as scheduled.   -------------------------------------------------------------------------------------------------------------

## 2022-01-30 NOTE — Progress Notes (Signed)
   Subjective:  Patient reports pain as mild.  Feels much better overall.  Objective:   VITALS:   Vitals:   01/29/22 0836 01/29/22 0901 01/29/22 1957 01/30/22 0400  BP: (!) 145/86 (!) 156/83 (!) 131/98 125/82  Pulse: (!) 56 69 (!) 58 62  Resp: 12  18 18   Temp: 98.7 F (37.1 C)  98.1 F (36.7 C) 98.2 F (36.8 C)  TempSrc:   Oral Oral  SpO2: 93% 96% 96% 98%  Weight:      Height:        VAC with good suction, no drainage in canister. He can extend fingers but there is more pain and weakness with the RF.   Lab Results  Component Value Date   WBC 6.9 01/30/2022   HGB 14.1 01/30/2022   HCT 41.1 01/30/2022   MCV 90.1 01/30/2022   PLT 290 01/30/2022     Assessment/Plan:  1 Day Post-Op   - patient ready for d/c today - will take 2 weeks of linezolid and then will be adjusted by Dr. 14/04/2021 of ID at follow up appt based on susceptibilities - will need follow up with me in 1 week - home with prevena unit  Renold Don 01/30/2022, 9:10 AM

## 2022-01-30 NOTE — Plan of Care (Signed)

## 2022-01-31 ENCOUNTER — Encounter (HOSPITAL_COMMUNITY): Payer: Self-pay | Admitting: Orthopaedic Surgery

## 2022-01-31 NOTE — Progress Notes (Signed)
Pt received 1mg  morphine at 0052 Dec 2. 1mg  was wasted but not charted in pixis. Waste was witnessed by 03-07-1984 RN. This reported was going to chart in pixis tonight with but pt has been [facility]

## 2022-02-01 LAB — AEROBIC/ANAEROBIC CULTURE W GRAM STAIN (SURGICAL/DEEP WOUND)

## 2022-02-02 LAB — AEROBIC/ANAEROBIC CULTURE W GRAM STAIN (SURGICAL/DEEP WOUND)

## 2022-02-05 ENCOUNTER — Ambulatory Visit (INDEPENDENT_AMBULATORY_CARE_PROVIDER_SITE_OTHER): Payer: Managed Care, Other (non HMO) | Admitting: Physician Assistant

## 2022-02-05 DIAGNOSIS — L02512 Cutaneous abscess of left hand: Secondary | ICD-10-CM

## 2022-02-05 MED ORDER — MUPIROCIN 2 % EX OINT: 1.0000 | TOPICAL_OINTMENT | Freq: Every day | CUTANEOUS | 0 refills | Status: DC

## 2022-02-05 NOTE — Progress Notes (Signed)
Post-Op Visit Note   Patient: Taylor Heath           Date of Birth: 19-Jun-1960           MRN: [id number] Visit Date: 02/05/2022 PCP: Pcp, No   Assessment & Plan:  Chief Complaint:  Chief Complaint  Patient presents with   Left Hand - Routine Post Op   Visit Diagnoses:  1. Abscess of dorsum of left hand     Plan: Patient is a pleasant 61 year old gentleman who comes in today 1 week status post left hand I&D which grew out Staph aureus.  Currently on Bactrim per ID x 6 weeks.  He is scheduled to follow-up with Dr. Verdie Mosher next week.  He has been wearing a wound VAC over the past week without any accumulation in the canister.  Overall, feeling well.  No fevers or chills.  Examination left hand reveals a well-healing incision with nylon sutures in place.  He does have slight superficial wound dehiscence to the distal half.  No drainage.  No erythema or cellulitis.  He is neurovascularly intact distally.  Today, his wound was covered with mupirocin and a dry bandage.  He will follow-up next week for recheck.  Call with concerns or questions in the meantime.  Follow-Up Instructions: Return in about 1 week (around 02/12/2022).   Orders:  No orders of the defined types were placed in this encounter.  No orders of the defined types were placed in this encounter.   Imaging: No new imaging  PMFS History: Patient Active Problem List   Diagnosis Date Noted   Abscess of dorsum of left hand 01/26/2022   Extensor tenosynovitis of left wrist 12/01/2021   Primary osteoarthritis of right wrist 04/26/2017   Radiculopathy of cervical spine 04/26/2017   Total knee replacement status 01/27/2017   Primary osteoarthritis of right shoulder 12/30/2016   Primary osteoarthritis of right knee 12/30/2016   Past Medical History:  Diagnosis Date   Anxiety    Arthritis    GERD (gastroesophageal reflux disease)    History of kidney stones    Pneumonia     No family history on file.  Past Surgical  History:  Procedure Laterality Date   I & D EXTREMITY Left 01/27/2022   Procedure: IRRIGATION AND DEBRIDEMENT LEFT HAND;  Surgeon: Tarry Kos, MD;  Location: [facility] OR;  Service: Orthopedics;  Laterality: Left;   I & D EXTREMITY Left 01/29/2022   Procedure: REPEAT IRRIGATION AND DEBRIDEMENT OF HAND;  Surgeon: Tarry Kos, MD;  Location: [facility] OR;  Service: Orthopedics;  Laterality: Left;   JOINT REPLACEMENT     left hip replaced   KNEE ARTHROSCOPY W/ ACL RECONSTRUCTION Right 2009   MINOR APPLICATION OF WOUND VAC  01/29/2022   Procedure: APPLICATION OF WOUND VAC;  Surgeon: Tarry Kos, MD;  Location: [facility] OR;  Service: Orthopedics;;   ORIF FINGER / THUMB FRACTURE     TOTAL KNEE ARTHROPLASTY Right 01/27/2017   Procedure: RIGHT TOTAL KNEE ARTHROPLASTY;  Surgeon: Tarry Kos, MD;  Location: [facility] OR;  Service: Orthopedics;  Laterality: Right;   Social History   Occupational History   Not on file  Tobacco Use   Smoking status: Former    Types: Cigarettes    Quit date: 04/21/2010    Years since quitting: 11.8   Smokeless tobacco: Never  Vaping Use   Vaping Use: Never used  Substance and Sexual Activity   Alcohol use: No   Drug use:  No   Sexual activity: Not on file

## 2022-02-09 ENCOUNTER — Encounter: Payer: Self-pay | Admitting: Internal Medicine

## 2022-02-09 ENCOUNTER — Other Ambulatory Visit: Payer: Self-pay

## 2022-02-09 ENCOUNTER — Ambulatory Visit (INDEPENDENT_AMBULATORY_CARE_PROVIDER_SITE_OTHER): Payer: Managed Care, Other (non HMO) | Admitting: Internal Medicine

## 2022-02-09 VITALS — BP 153/81 | HR 73 | Temp 98.9°F | Wt 162.0 lb

## 2022-02-09 DIAGNOSIS — M65842 Other synovitis and tenosynovitis, left hand: Secondary | ICD-10-CM | POA: Diagnosis not present

## 2022-02-09 DIAGNOSIS — M659 Synovitis and tenosynovitis, unspecified: Secondary | ICD-10-CM

## 2022-02-09 DIAGNOSIS — T8149XA Infection following a procedure, other surgical site, initial encounter: Secondary | ICD-10-CM

## 2022-02-09 MED ORDER — DOXYCYCLINE HYCLATE 100 MG PO TABS: 100.0000 mg | ORAL_TABLET | Freq: Two times a day (BID) | ORAL | 0 refills | Status: AC

## 2022-02-09 NOTE — Progress Notes (Signed)
[facility]  Patient Active Problem List   Diagnosis Date Noted   Abscess of dorsum of left hand 01/26/2022   Extensor tenosynovitis of left wrist 12/01/2021   Primary osteoarthritis of right wrist 04/26/2017   Radiculopathy of cervical spine 04/26/2017   Total knee replacement status 01/27/2017   Primary osteoarthritis of right shoulder 12/30/2016   Primary osteoarthritis of right knee 12/30/2016      Subjective:    Patient ID: Taylor Heath, male    DOB: Oct 29, 1960, 61 y.o.   MRN: [id number]  Chief Complaint  Patient presents with   Follow-up    HPI:  Taylor Heath is a 61 y.o. male here for f/u hospital I&D for his left wrist tenosynovitis/surgical site infection  He is currently taking zyvox. He was given 2 weeks of it The wound culture grew mrsa (S doxy)  He was on doxy prior to the surgery  No f/c/n/v/diarrhea The pain is getting a lot better  He'll see dr Roda Shutters next week. Sutures not yet removed. He is also putting mupirocin on the wound     Allergies  Allergen Reactions   Meperidine     Unknown reaction       Outpatient Medications Prior to Visit  Medication Sig Dispense Refill   diclofenac (VOLTAREN) 75 MG EC tablet Take 75 mg by mouth daily.     HYDROcodone-acetaminophen (NORCO) 5-325 MG tablet Take 1 tablet by mouth 3 (three) times daily as needed. To be taken after surgery 15 tablet 0   linezolid (ZYVOX) 600 MG tablet Take 1 tablet (600 mg total) by mouth 2 (two) times daily for 14 days. 28 tablet 0   Multiple Vitamins-Minerals (MULTIVITAMIN PO) Take 1 tablet daily by mouth.     mupirocin ointment (BACTROBAN) 2 % Apply 1 Application topically daily. 22 g 0   Omega-3 Fatty Acids (FISH OIL PO) Take 900 mg by mouth daily.     omeprazole (PRILOSEC OTC) 20 MG tablet Take 20 mg daily by mouth.     pravastatin (PRAVACHOL) 40 MG tablet Take 40 mg daily by mouth.  4   valACYclovir (VALTREX) 500 MG tablet Take 500 mg  daily by mouth.  2   acetaminophen (TYLENOL) 325 MG tablet Take 650 mg by mouth every 6 (six) hours as needed for moderate pain. (Patient not taking: Reported on 02/09/2022)     ondansetron (ZOFRAN) 4 MG tablet Take 1 tablet (4 mg total) by mouth every 8 (eight) hours as needed for nausea or vomiting. (Patient not taking: Reported on 01/26/2022) 40 tablet 0   No facility-administered medications prior to visit.     Social History   Socioeconomic History   Marital status: Married    Spouse name: Not on file   Number of children: Not on file   Years of education: Not on file   Highest education level: Not on file  Occupational History   Not on file  Tobacco Use   Smoking status: Former    Types: Cigarettes    Quit date: 04/21/2010    Years since quitting: 11.8   Smokeless tobacco: Never  Vaping Use   Vaping Use: Never used  Substance and Sexual Activity   Alcohol use: No   Drug use: No   Sexual activity: Not on file  Other Topics Concern   Not on file  Social History Narrative   Not on file   Social Determinants of Health  Financial Resource Strain: Not on file  Food Insecurity: No Food Insecurity (01/28/2022)   Hunger Vital Sign    Worried About Running Out of Food in the Last Year: Never true    Ran Out of Food in the Last Year: Never true  Transportation Needs: No Transportation Needs (01/28/2022)   PRAPARE - Administrator, Civil Service (Medical): No    Lack of Transportation (Non-Medical): No  Physical Activity: Not on file  Stress: Not on file  Social Connections: Not on file  Intimate Partner Violence: Not At Risk (01/28/2022)   Humiliation, Afraid, Rape, and Kick questionnaire    Fear of Current or Ex-Partner: No    Emotionally Abused: No    Physically Abused: No    Sexually Abused: No      Review of Systems    All other ros negative   Objective:    BP (!) 153/81   Pulse 73   Temp 98.9 F (37.2 C) (Oral)   Wt 162 lb (73.5 kg)    SpO2 99%   BMI 23.24 kg/m  Nursing note and vital signs reviewed.  Physical Exam     General/constitutional: no distress, pleasant HEENT: Normocephalic, PER, Conj Clear, EOMI, Oropharynx clear Neck supple CV: rrr no mrg Lungs: clear to auscultation, normal respiratory effort Abd: Soft, Nontender Ext: no edema Skin/msk: incision with suture; clear crusting; no erythema/purulence/fluctuance     Labs:  Micro:  Serology:  Imaging:  Assessment & Plan:   Problem List Items Addressed This Visit   None Visit Diagnoses     Surgical site infection    -  Primary   Relevant Orders   C-reactive protein   CBC   COMPLETE METABOLIC PANEL WITH GFR   Tenosynovitis       Relevant Orders   C-reactive protein   CBC   COMPLETE METABOLIC PANEL WITH GFR         No orders of the defined types were placed in this encounter.    Abx: 11/29-c amp/sulbactam   10/30-11/29 3 days cephalexin followed by doxycycline     Assessment: 61 yo male hx left hip replacement 2012, right total knee replacement 2018, rheumatoid arthritis not on dmards/pred or any specific RA treatment, bilateral carpal tunnel syndrome, who has had left wrist extensor tendon repair/tenolysis for concern of impending tendon rupture, complicated by early surgical site infection    No sepsis outside of localized wrist soft tissue infection   12/01 S/p repeat left hand abscess I&D 11/29 s/p I&D left hand abscess/extensor tendons -- cx staph aureus pending susceptibility   I discussed with Dr Roda Shutters. No more surgery is planned. He had to debride down to the metacarpal level. There was no wrist joint involvement. While there was sign of gross metacarpal involvement, given the significant inflammatory change of RA, organism being staph aureus, and depth of infection, will plan to treat for 6 weeks    ------- 02/09/22 assessment Incision healling well no obvious dehiscence; no contact dermatitis visualized with the  mupirocin Finish 4 more days of zyvox for the initial 2 weeks then transition to 4 more weeks of doxycycline  I suspect necrotic tissue was so extensive and needed debridement for abx to work Labs today F/u in 4 weeks with me 4 weeks rx doxy given F/u ortho  Avoid mupirocin     Follow-up: Return in about 4 weeks (around 03/09/2022).      Raymondo Band, MD [facility] 02/09/2022, 11:02 AM

## 2022-02-09 NOTE — Patient Instructions (Signed)
Avoid mupirocin  Pickup doxy and start after zyvox is done (do for 4 weeks)   Labs today   See me in 4 weeks

## 2022-02-10 LAB — COMPLETE METABOLIC PANEL WITH GFR
AG Ratio: 1.8 (calc) (ref 1.0–2.5)
ALT: 32 U/L (ref 9–46)
AST: 22 U/L (ref 10–35)
Albumin: 4.6 g/dL (ref 3.6–5.1)
Alkaline phosphatase (APISO): 74 U/L (ref 35–144)
BUN: 19 mg/dL (ref 7–25)
CO2: 26 mmol/L (ref 20–32)
Calcium: 9.6 mg/dL (ref 8.6–10.3)
Chloride: 105 mmol/L (ref 98–110)
Creat: 0.97 mg/dL (ref 0.70–1.35)
Globulin: 2.5 g/dL (calc) (ref 1.9–3.7)
Glucose, Bld: 95 mg/dL (ref 65–99)
Potassium: 4.5 mmol/L (ref 3.5–5.3)
Sodium: 141 mmol/L (ref 135–146)
Total Bilirubin: 0.4 mg/dL (ref 0.2–1.2)
Total Protein: 7.1 g/dL (ref 6.1–8.1)
eGFR: 89 mL/min/{1.73_m2} (ref >=60)

## 2022-02-10 LAB — CBC
HCT: 43.8 % (ref 38.5–50.0)
Hemoglobin: 15 g/dL (ref 13.2–17.1)
MCH: 31.1 pg (ref 27.0–33.0)
MCHC: 34.2 g/dL (ref 32.0–36.0)
MCV: 90.7 fL (ref 80.0–100.0)
MPV: 8.7 fL (ref 7.5–12.5)
Platelets: 316 10*3/uL (ref 140–400)
RBC: 4.83 10*6/uL (ref 4.20–5.80)
RDW: 12.9 % (ref 11.0–15.0)
WBC: 6 10*3/uL (ref 3.8–10.8)

## 2022-02-10 LAB — C-REACTIVE PROTEIN: CRP: 1.3 mg/L (ref <8.0)

## 2022-02-11 NOTE — Progress Notes (Signed)
Office Visit Note  Patient: Taylor Heath             Date of Birth: 1960-06-07           MRN: [id number]             PCP: Elfredia Nevins, MD Referring: Tarry Kos, MD Visit Date: 02/12/2022   Subjective:   History of Present Illness: Taylor Heath is a 61 y.o. male here for seropositive RA. He previously saw Dr. Cardell Peach in 2016 without active disease noted at that time persistent positive CCP Abs.  He was never started on any long-term DMARD.  He has had some degree of chronic joint pain at the hands mostly related to first [facility] joint osteoarthritis.  Otherwise also with some chronic osteoarthritis symptoms affecting the knees and hips.  Recently with complications of tenosynovitis and hand deformity complicated by extensor tendon damage progressing towards possible rupture. Surgical repair with tenolysis was complicated recently with surgical site infection with staph aureus requiring debridement and subsequent prolonged antibiotic on linezolid then doxycycline planned for next month.  Currently symptoms are pretty much at baseline excepting the left wrist which has some stiffness and decreased range of motion with the ongoing healing dorsal surgical wound.  He has a family history his father had severe deforming rheumatoid arthritis.  Labs reviewed 10/2014 RF 106 CCP 34  Imaging reviewed MRI Left Wrist 11/26/21 IMPRESSION: 1. Moderate to high-grade fourth and fifth, moderate second and third, and mild to moderate 6th dorsal extensor compartment tenosynovitis. There is scattered debris and some thin internal septations within the tendon sheath fluid indicating mild complexity. 2. Multiple interstitial tears within the fourth and fifth extensor compartment tendons. Moderate extensor carpi ulnaris tendinosis with small interstitial tears. 3. Possible partial-thickness tear of the distal peripheral triangular fibrocartilage complex. 4. Possible partial-thickness tear of the proximal  aspect of the lunotriquetral ligament. 5. Moderate to severe triscaphe, moderate thumb carpometacarpal, and moderate lunotriquetral cartilage degenerative changes. Scattered carpal subchondral cysts.  Bilateral hand xrays 03/2020 multiple carpal cysts, mild to moderate 1st [facility] joint osteoarthritis   Activities of Daily Living:  Patient reports morning stiffness for 0  minutes.   Patient Denies nocturnal pain.  Difficulty dressing/grooming: Denies Difficulty climbing stairs: Denies Difficulty getting out of chair: Denies Difficulty using hands for taps, buttons, cutlery, and/or writing: Reports  Review of Systems  Constitutional:  Negative for fatigue.  HENT:  Negative for mouth sores and mouth dryness.   Eyes:  Negative for dryness.  Respiratory:  Negative for shortness of breath.   Cardiovascular:  Negative for chest pain and palpitations.  Gastrointestinal:  Negative for blood in stool, constipation and diarrhea.  Endocrine: Negative for increased urination.  Genitourinary:  Negative for involuntary urination.  Musculoskeletal:  Positive for joint pain, joint pain and joint swelling. Negative for gait problem, myalgias, muscle weakness, morning stiffness, muscle tenderness and myalgias.  Skin:  Negative for color change, rash and sensitivity to sunlight.  Allergic/Immunologic: Negative for susceptible to infections.  Neurological:  Negative for dizziness and headaches.  Hematological:  Negative for swollen glands.  Psychiatric/Behavioral:  Negative for depressed mood and sleep disturbance. The patient is not nervous/anxious.     PMFS History:  Patient Active Problem List   Diagnosis Date Noted   Seropositive rheumatoid arthritis (HCC) 02/12/2022   High risk medication use 02/12/2022   Abscess of dorsum of left hand 01/26/2022   Extensor tenosynovitis of left wrist 12/01/2021   Primary osteoarthritis of  right wrist 04/26/2017   Radiculopathy of cervical spine 04/26/2017    Total knee replacement status 01/27/2017   Primary osteoarthritis of right shoulder 12/30/2016   Primary osteoarthritis of right knee 12/30/2016    Past Medical History:  Diagnosis Date   Anxiety    Arthritis    GERD (gastroesophageal reflux disease)    History of kidney stones    Pneumonia     Family History  Problem Relation Age of Onset   Rheum arthritis Father    Past Surgical History:  Procedure Laterality Date   FINGER SURGERY Left    index   I & D EXTREMITY Left 01/27/2022   Procedure: IRRIGATION AND DEBRIDEMENT LEFT HAND;  Surgeon: Tarry Kos, MD;  Location: [facility] OR;  Service: Orthopedics;  Laterality: Left;   I & D EXTREMITY Left 01/29/2022   Procedure: REPEAT IRRIGATION AND DEBRIDEMENT OF HAND;  Surgeon: Tarry Kos, MD;  Location: [facility] OR;  Service: Orthopedics;  Laterality: Left;   JOINT REPLACEMENT     left hip replaced   KNEE ARTHROSCOPY W/ ACL RECONSTRUCTION Right 2009   LEG SURGERY Left    left calf   MINOR APPLICATION OF WOUND VAC  01/29/2022   Procedure: APPLICATION OF WOUND VAC;  Surgeon: Tarry Kos, MD;  Location: [facility] OR;  Service: Orthopedics;;   ORIF FINGER / THUMB FRACTURE     TOTAL KNEE ARTHROPLASTY Right 01/27/2017   Procedure: RIGHT TOTAL KNEE ARTHROPLASTY;  Surgeon: Tarry Kos, MD;  Location: [facility] OR;  Service: Orthopedics;  Laterality: Right;   Social History   Social History Narrative   Not on file    There is no immunization history on file for this patient.   Objective: Vital Signs: BP 124/84 (BP Location: Right Arm, Patient Position: Sitting, Cuff Size: Normal)   Pulse 68   Resp 17   Ht 5' 9.75" (1.772 m)   Wt 162 lb 12.8 oz (73.8 kg)   BMI 23.53 kg/m    Physical Exam Eyes:     Conjunctiva/sclera: Conjunctivae normal.  Cardiovascular:     Rate and Rhythm: Normal rate and regular rhythm.  Pulmonary:     Effort: Pulmonary effort is normal.     Breath sounds: Normal breath sounds.  Musculoskeletal:     Right lower leg: No  edema.     Left lower leg: No edema.  Lymphadenopathy:     Cervical: No cervical adenopathy.  Neurological:     Mental Status: He is alert.  Psychiatric:        Mood and Affect: Mood normal.      Musculoskeletal Exam:  Shoulders full ROM no tenderness or swelling Elbows full ROM no tenderness or swelling Left wrist mild overlying swelling, surgical wound on dorsal side with no surrounding erythema or induration Left 3rd-4rd fingers limited extension ROM and decreased extension strength, normal flexion, no synovitis Knees full ROM no tenderness or swelling Ankles full ROM no tenderness or swelling   CDAI Exam: CDAI Score: 2  Patient Global: 0 mm; Provider Global: 0 mm Swollen: 1 ; Tender: 1  Joint Exam 02/12/2022      Right  Left  Wrist     Swollen Tender     Investigation: No additional findings.  Imaging: No results found.  Recent Labs: Lab Results  Component Value Date   WBC 6.0 02/09/2022   HGB 15.0 02/09/2022   PLT 316 02/09/2022   NA 141 02/09/2022   K 4.5 02/09/2022   CL  105 02/09/2022   CO2 26 02/09/2022   GLUCOSE 95 02/09/2022   BUN 19 02/09/2022   CREATININE 0.97 02/09/2022   BILITOT 0.4 02/09/2022   ALKPHOS 65 01/19/2017   AST 22 02/09/2022   ALT 32 02/09/2022   PROT 7.1 02/09/2022   ALBUMIN 4.5 01/19/2017   CALCIUM 9.6 02/09/2022   GFRAA >60 01/28/2017    Speciality Comments: No specialty comments available.  Procedures:  No procedures performed Allergies: Meperidine   Assessment / Plan:     Visit Diagnoses: Seropositive rheumatoid arthritis (HCC) - Plan: Rheumatoid factor, Cyclic citrul peptide antibody, IgG  Somewhat unusual presentation with fairly minimal inflammatory joint symptoms compared to his serology and characteristic tendon damage.  May be somewhat overshadowed with the osteoarthritis also possible arthritis robustus.  However considering the tenosynovitis and complications have been very much inclined to recommend some a  maintenance DMARD treatment strategy.  Will recheck the rheumatoid factor and CCP antibody titers that were a persistent week positive in the past.  Not a good candidate to start immunomodulatory drug right now while still on antibiotics for Staph aureus infection will need to get over this plus if there is any upcoming additional surgical intervention required.  High risk medication use - Plan: Hepatitis B core antibody, IgM, Hepatitis B surface antigen, Hepatitis C antibody, QuantiFERON-TB Gold Plus  Discussed RA medications most likely to consider methotrexate checking baseline labs including hepatitis serology.  Also going ahead checking QuantiFERON while off medication in case biologic DMARD option needed.  Extensor tenosynovitis of left wrist Abscess of dorsum of left hand  Appears to be doing well he has another month of antibiotics with the doxycycline planned.  There is decreased extension ability in several fingers may need additional hand surgery down the road to resolve this,  Orders: Orders Placed This Encounter  Procedures   Rheumatoid factor   Cyclic citrul peptide antibody, IgG   Hepatitis B core antibody, IgM   Hepatitis B surface antigen   Hepatitis C antibody   QuantiFERON-TB Gold Plus   No orders of the defined types were placed in this encounter.    Follow-Up Instructions: Return in about 10 weeks (around 04/23/2022) for New pt RA ID issues f/u 2-60mos.   Fuller Plan, MD  Note - This record has been created using [employer].  Chart creation errors have been sought, but may not always  have been located. Such creation errors do not reflect on  the standard of medical care.

## 2022-02-12 ENCOUNTER — Encounter: Payer: Managed Care, Other (non HMO) | Admitting: Internal Medicine

## 2022-02-12 ENCOUNTER — Encounter: Payer: Self-pay | Admitting: Internal Medicine

## 2022-02-12 ENCOUNTER — Ambulatory Visit: Payer: Managed Care, Other (non HMO) | Attending: Internal Medicine | Admitting: Internal Medicine

## 2022-02-12 VITALS — BP 124/84 | HR 68 | Resp 17 | Ht 69.75 in | Wt 162.8 lb

## 2022-02-12 DIAGNOSIS — M65832 Other synovitis and tenosynovitis, left forearm: Secondary | ICD-10-CM | POA: Diagnosis not present

## 2022-02-12 DIAGNOSIS — M059 Rheumatoid arthritis with rheumatoid factor, unspecified: Secondary | ICD-10-CM | POA: Diagnosis not present

## 2022-02-12 DIAGNOSIS — Z79899 Other long term (current) drug therapy: Secondary | ICD-10-CM | POA: Diagnosis not present

## 2022-02-12 DIAGNOSIS — L02512 Cutaneous abscess of left hand: Secondary | ICD-10-CM | POA: Diagnosis not present

## 2022-02-15 LAB — QUANTIFERON-TB GOLD PLUS
Mitogen-NIL: >10 IU/mL
NIL: 0.02 IU/mL
QuantiFERON-TB Gold Plus: NEGATIVE
TB1-NIL: 0 IU/mL
TB2-NIL: 0.01 IU/mL

## 2022-02-15 LAB — HEPATITIS B CORE ANTIBODY, IGM: Hep B C IgM: NONREACTIVE

## 2022-02-15 LAB — HEPATITIS C ANTIBODY: Hepatitis C Ab: NONREACTIVE

## 2022-02-15 LAB — RHEUMATOID FACTOR: Rheumatoid fact SerPl-aCnc: 159 IU/mL — ABNORMAL HIGH (ref <14)

## 2022-02-15 LAB — CYCLIC CITRUL PEPTIDE ANTIBODY, IGG: Cyclic Citrullin Peptide Ab: 196 UNITS — ABNORMAL HIGH

## 2022-02-15 LAB — HEPATITIS B SURFACE ANTIGEN: Hepatitis B Surface Ag: NONREACTIVE

## 2022-02-15 NOTE — Progress Notes (Signed)
Lab results demonstrate highly positive rheumatoid arthritis antibody markers, higher than when checked a few years ago. He is negative for viral hepatitis or tuberculosis as expected. His blood cell count and kidney and liver function tests were all normal already checked within the past few weeks. So would not have any major reason why we could not start medications after current wrist problem is improved.

## 2022-02-16 ENCOUNTER — Ambulatory Visit (INDEPENDENT_AMBULATORY_CARE_PROVIDER_SITE_OTHER): Payer: Managed Care, Other (non HMO) | Admitting: Orthopaedic Surgery

## 2022-02-16 ENCOUNTER — Encounter: Payer: Self-pay | Admitting: Orthopaedic Surgery

## 2022-02-16 DIAGNOSIS — L02512 Cutaneous abscess of left hand: Secondary | ICD-10-CM

## 2022-02-16 DIAGNOSIS — M65832 Other synovitis and tenosynovitis, left forearm: Secondary | ICD-10-CM

## 2022-02-16 NOTE — Progress Notes (Signed)
Post-Op Visit Note   Patient: Taylor Heath           Date of Birth: 01-28-1961           MRN: [id number] Visit Date: 02/16/2022 PCP: Elfredia Nevins, MD   Assessment & Plan:  Chief Complaint:  Chief Complaint  Patient presents with   Left Hand - Follow-up    Left hand I&D 01/29/2022   Visit Diagnoses:  1. Abscess of dorsum of left hand   2. Extensor tenosynovitis of left wrist     Plan: Mr. Speegle is here for first postop visit status post hand abscess I&D on 01/29/2022.  He is on doxycycline for another 4 weeks.  He is seeing Dr. Renold Don in the ID clinic.  Examination of left hand shows healed surgical incision with a dry scab.  Sutures intact.  No signs of infection.  We will remove the sutures and he can put Aquaphor to the surgical scar.  I would like to send him to OT for hand rehab hopefully he can strengthen his long and ring finger.  His ring finger extension strength has significantly improved.  I would like to recheck him in about 3 weeks.  He does have a follow-up with Dr. Renold Don in about 3 weeks.  Follow-Up Instructions: Return in about 3 weeks (around 03/09/2022).   Orders:  Orders Placed This Encounter  Procedures   Ambulatory referral to Occupational Therapy   No orders of the defined types were placed in this encounter.   Imaging: No results found.  PMFS History: Patient Active Problem List   Diagnosis Date Noted   Seropositive rheumatoid arthritis (HCC) 02/12/2022   High risk medication use 02/12/2022   Abscess of dorsum of left hand 01/26/2022   Extensor tenosynovitis of left wrist 12/01/2021   Primary osteoarthritis of right wrist 04/26/2017   Radiculopathy of cervical spine 04/26/2017   Total knee replacement status 01/27/2017   Primary osteoarthritis of right shoulder 12/30/2016   Primary osteoarthritis of right knee 12/30/2016   Past Medical History:  Diagnosis Date   Anxiety    Arthritis    GERD (gastroesophageal reflux disease)    History of  kidney stones    Pneumonia     Family History  Problem Relation Age of Onset   Rheum arthritis Father     Past Surgical History:  Procedure Laterality Date   FINGER SURGERY Left    index   I & D EXTREMITY Left 01/27/2022   Procedure: IRRIGATION AND DEBRIDEMENT LEFT HAND;  Surgeon: Tarry Kos, MD;  Location: [facility] OR;  Service: Orthopedics;  Laterality: Left;   I & D EXTREMITY Left 01/29/2022   Procedure: REPEAT IRRIGATION AND DEBRIDEMENT OF HAND;  Surgeon: Tarry Kos, MD;  Location: [facility];  Service: Orthopedics;  Laterality: Left;   JOINT REPLACEMENT     left hip replaced   KNEE ARTHROSCOPY W/ ACL RECONSTRUCTION Right 2009   LEG SURGERY Left    left calf   MINOR APPLICATION OF WOUND VAC  01/29/2022   Procedure: APPLICATION OF WOUND VAC;  Surgeon: Tarry Kos, MD;  Location: [facility];  Service: Orthopedics;;   ORIF FINGER / THUMB FRACTURE     TOTAL KNEE ARTHROPLASTY Right 01/27/2017   Procedure: RIGHT TOTAL KNEE ARTHROPLASTY;  Surgeon: Tarry Kos, MD;  Location: [facility];  Service: Orthopedics;  Laterality: Right;   Social History   Occupational History   Not on file  Tobacco Use   Smoking  status: Former    Types: Cigarettes    Quit date: 04/21/2010    Years since quitting: 11.8    Passive exposure: Never   Smokeless tobacco: Never  Vaping Use   Vaping Use: Never used  Substance and Sexual Activity   Alcohol use: No   Drug use: No   Sexual activity: Not on file

## 2022-02-17 NOTE — Therapy (Signed)
OUTPATIENT OCCUPATIONAL THERAPY ORTHO EVALUATION  Patient Name: Taylor Heath MRN: [id number] DOB:[birth year], 61 y.o., male Today's Date: 02/18/2022  PCP: Redmond School, MD  REFERRING PROVIDER:  Leandrew Koyanagi, MD    END OF SESSION:  OT End of Session - 02/18/22 1106     Visit Number 1    Number of Visits 12    Date for OT Re-Evaluation 04/02/22    Authorization Type [employer] Managed    OT Start Time 1106    OT Stop Time 1155    OT Time Calculation (min) 49 min    Equipment Utilized During Treatment orthotic materials    Activity Tolerance Patient limited by pain;Patient limited by fatigue;Patient tolerated treatment well;No increased pain    Behavior During Therapy WFL for tasks assessed/performed             Past Medical History:  Diagnosis Date   Anxiety    Arthritis    GERD (gastroesophageal reflux disease)    History of kidney stones    Pneumonia    Past Surgical History:  Procedure Laterality Date   FINGER SURGERY Left    index   I & D EXTREMITY Left 01/27/2022   Procedure: IRRIGATION AND DEBRIDEMENT LEFT HAND;  Surgeon: Leandrew Koyanagi, MD;  Location: [facility];  Service: Orthopedics;  Laterality: Left;   I & D EXTREMITY Left 01/29/2022   Procedure: REPEAT IRRIGATION AND DEBRIDEMENT OF HAND;  Surgeon: Leandrew Koyanagi, MD;  Location: [facility];  Service: Orthopedics;  Laterality: Left;   JOINT REPLACEMENT     left hip replaced   KNEE ARTHROSCOPY W/ ACL RECONSTRUCTION Right 2009   LEG SURGERY Left    left calf   MINOR APPLICATION OF WOUND VAC  01/29/2022   Procedure: APPLICATION OF WOUND VAC;  Surgeon: Leandrew Koyanagi, MD;  Location: [facility];  Service: Orthopedics;;   ORIF FINGER / THUMB FRACTURE     TOTAL KNEE ARTHROPLASTY Right 01/27/2017   Procedure: RIGHT TOTAL KNEE ARTHROPLASTY;  Surgeon: Leandrew Koyanagi, MD;  Location: [facility];  Service: Orthopedics;  Laterality: Right;   Patient Active Problem List   Diagnosis Date Noted   Seropositive rheumatoid arthritis ([place])  02/12/2022   High risk medication use 02/12/2022   Abscess of dorsum of left hand 01/26/2022   Extensor tenosynovitis of left wrist 12/01/2021   Primary osteoarthritis of right wrist 04/26/2017   Radiculopathy of cervical spine 04/26/2017   Total knee replacement status 01/27/2017   Primary osteoarthritis of right shoulder 12/30/2016   Primary osteoarthritis of right knee 12/30/2016    ONSET DATE: DOS 01/29/22 (I&D Lt hand) (Ext tendon repairs 12/10/21)  REFERRING DIAG:  L02.512 (ICD-10-CM) - Abscess of dorsum of left hand  M65.832 (ICD-10-CM) - Extensor tenosynovitis of left wrist    THERAPY DIAG:  Muscle weakness (generalized)  Localized edema  Other lack of coordination  Pain in left hand  Pain in left wrist  Stiffness of left hand, not elsewhere classified  Rationale for Evaluation and Treatment: Rehabilitation  SUBJECTIVE:   SUBJECTIVE STATEMENT: He is a former Engineer, drilling homes amongst other things.  He enjoys golfing and is very active with his friends and family. He states developing rheumatoid arthritis and carpal tunnel syndrome bilaterally and hands and wrists.  He had carpal tunnel injections which seem to help, but he also had a significant mass on the dorsum of his left wrist which turned out to be tenosynovitis complicated by rheumatism.  On MRI  there showed fraying and tearing of his extensor tendons as well.  He underwent surgical repairs with Dr. Erlinda Hong in October 10 weeks ago, and he did state doing forceful activities immediately after that surgery (he moving potted plants, lifting and carrying objects).  Unfortunately he developed an infection and needed a debridement surgery at the end of November when the wound was left open for packing, followed by a second debridement surgery at the start of December at which time the wound was closed.  He states initially having some type of bracing for his wrist, but he is not wearing anything but a bandage  today over the dorsum of his hand.  He states having difficulties with higher level IADLs and fine motor skills due to inability to extend his fingers.  He also feels some tightness and mild pain in the dorsum of the wrist from surgery and also rheumatism most likely.   PERTINENT HISTORY: 10 weeks post tenosynovectomy and ext tendon repairs, 3 weeks post most recent I& D sx.  Per MD: "Eval and treat s/p left wrist tenosynovectomy. Strengthening of long and ring finger extension...OT for hand rehab hopefully he can strengthen his long and ring finger. His ring finger extension strength has significantly improved. I would like to recheck him in about 3 weeks. He does have a follow-up with Dr. Gale Journey in about 3 weeks. "  He had left wrist extensor tendon repair/tenolysis for concern of impending tendon rupture, complicated by early surgical site infection.  PRECAUTIONS: None  WEIGHT BEARING RESTRICTIONS:  Now 10+ weeks out, he should use caution for 2-4 more week to avoid heavy/painful   PAIN:  Are you having pain? None at rest Rating: 0/10 at rest now, up to 0/10 at worst in past week   FALLS: Has patient fallen in last 6 months? No  LIVING ENVIRONMENT: Lives with: lives with their family   PLOF: Independent  PATIENT GOALS: To be able to return to golfing and full use of his left nondominant hand.   OBJECTIVE: (All objective assessments below are from initial evaluation on: 02/18/22 unless otherwise specified.)   HAND DOMINANCE: Right   ADLs: Overall ADLs: States decreased ability to grab, hold household objects, pain and problems opening containers, perform FMS tasks (manipulate fasteners on clothing)  FUNCTIONAL OUTCOME MEASURES: Eval: Quck DASH 18% impairment today  (Higher % Score  =  More Impairment)    UPPER EXTREMITY ROM     Shoulder to Wrist AROM Right eval Left eval  Forearm supination  85  Forearm pronation   85  Wrist flexion 59 49  Wrist extension 70 57  (Blank rows  = not tested)   Hand AROM Left eval  Full Fist Ability (or Gap to Distal Palmar Crease) Yes, loose fist  Thumb Opposition  (Kapandji Scale)  10  Long MCP (0-90) (-41) -  83  Long PIP (0-100) 0-100   Long DIP (0-70) 0-70   Ring MCP (0-90) (-21) -  82  Ring PIP (0-100)  0-95  Ring DIP (0-70) 0-70   (Blank rows = not tested)   UPPER EXTREMITY MMT:    Eval:  NT at eval due to recent and still healing injuries. Will be tested when appropriate.   MMT Left TBD  Elbow flexion   Elbow extension   Forearm supination   Forearm pronation   Wrist flexion   Wrist extension   (Blank rows = not tested)  HAND FUNCTION: Eval: Observed weakness in affected Lt hand, nt  due to recent surgeries.  Grip strength Right: TBD lbs, Left: TBD lbs   COORDINATION: Eval: Observed coordination impairments with affected Lt hand.Box and Blocks Test: Left 54 Blocks today (70 is AVG). FMS assessment TBD  SENSATION: Eval:  Light touch intact today  EDEMA:   Eval:  Mildly swollen in hand and dorsum of wrist today  COGNITION: Eval: Overall cognitive status: WFL for evaluation today, slightly and admittedly impulsive  OBSERVATIONS:   Eval: He has obvious deficiency with extensor tendons of left hand middle finger and ring fingers.  They are drooping and he is unable to extend his MCP joints, however with passive tenodesis he does show some finger extension at the MCP joints hopefully meaning extensor tendons are still intact, but significant extensor tendon lag from surgical complications with infections, and he likely overstretched them after surgery by getting into full fist and wrist flexion quickly (even potentially gapping his repair sutures).   TODAY'S TREATMENT:  Post-evaluation treatment: For his safety and self-care, OT recommends no painful or heavy lifting at the hand or the wrist on the left side more than 5 or 10 pounds as tolerated, he was also educated to be aware of potential to rupture  tendons or repair sites by combined wrist and finger flexion forcefully.  He states understanding and will avoid.  Next, OT fabricates relative motion orthotic that places middle and ring fingers in relative extension at the MCP joint compared to the index and small fingers.  With this on he can actively extend all fingers to neutral.  This will improve his function and hopefully allow for adequate healing and shortening of the extensor tendons to the middle and ring fingers.  It fit well today with no problems, but he was advised to take off and return ASAP if it was hurting rubbing or bothering him.  He will also probably need a resting full finger extension orthotic for night use when next seen.  Additionally he was advised to keep wound clean and covered and was given Tegaderm and larger bandages to do so.  Prayer stretch was advised to help with wrist flexor tightness, and he performs back well.   PATIENT EDUCATION: Education details: See tx section above for details  Person educated: Patient Education method: Verbal Instruction, Teach back, Handouts  Education comprehension: States and demonstrates understanding, Additional Education required    HOME EXERCISE PROGRAM: See tx section above for details    GOALS: Goals reviewed with patient? Yes   SHORT TERM GOALS: (STG required if POC>30 days) Target Date: 03/12/22  Pt will obtain protective, custom orthotic. Goal status: PARTIALLY MET (may require additional orthotic support)   2.  Pt will demo/state understanding of initial HEP to improve pain levels and prerequisite motion. Goal status: INITIAL   LONG TERM GOALS: Target Date: 04/02/22   Pt will improve functional ability by decreased impairment per Quick DASH assessment from 18% to 7% or better, for better quality of life. Goal status: INITIAL  2.  Pt will improve grip strength in Lt hand to at least 50lbs for functional use at home and in IADLs. Goal status: INITIAL  3.  Pt  will improve A/ROM in Lt MF and RF TAM from 212* and 226*, respectively to at least 230* each, to have functional motion for tasks like reach and grasp.  Goal status: INITIAL  4.  Pt will improve strength in Lt wrist ext from 3-/5 painful MMT to at least 4+/5 MMT to have increased functional ability  to carry out selfcare and higher-level homecare tasks with no difficulty. Goal status: INITIAL  5.  Pt will improve coordination skills in Lt hand, as seen by better score on BBT testing to at least 65 Blocks have increased functional ability to carry out fine motor tasks (fasteners, etc.) and more complex, coordinated IADLs (meal prep, sports, etc.).  Goal status: INITIAL    ASSESSMENT:  CLINICAL IMPRESSION: Patient is a 61 y.o. male who was seen today for occupational therapy evaluation for repair of left hand extensor tendons, pain, stiffness and complications from infections, and decreased functional ability.  He will benefit from outpatient occupational therapy to improve quality of life and functional ability  PERFORMANCE DEFICITS: in functional skills including ADLs, IADLs, coordination, dexterity, edema, ROM, strength, pain, fascial restrictions, flexibility, Fine motor control, Gross motor control, body mechanics, cardiopulmonary status limiting function, decreased knowledge of precautions, wound, skin integrity, and UE functional use, cognitive skills including problem solving and safety awareness, and psychosocial skills including coping strategies, environmental adaptation, and habits.   IMPAIRMENTS: are limiting patient from ADLs, IADLs, work, and leisure.   COMORBIDITIES: has co-morbidities such as left THA 2012, right TKA 2018, rheumatoid arthritis, bilateral CTS, and more  that affects occupational performance. Patient will benefit from skilled OT to address above impairments and improve overall function.  MODIFICATION OR ASSISTANCE TO COMPLETE EVALUATION: No modification of tasks or  assist necessary to complete an evaluation.  OT OCCUPATIONAL PROFILE AND HISTORY: Detailed assessment: Review of records and additional review of physical, cognitive, psychosocial history related to current functional performance.  CLINICAL DECISION MAKING: Moderate - several treatment options, min-mod task modification necessary  REHAB POTENTIAL: Good  EVALUATION COMPLEXITY: Low      PLAN:  OT FREQUENCY: 1-2x/week  OT DURATION: 6 weeks (through 04/02/22 as needed)   PLANNED INTERVENTIONS: self care/ADL training, therapeutic exercise, therapeutic activity, neuromuscular re-education, manual therapy, scar mobilization, passive range of motion, splinting, electrical stimulation, ultrasound, fluidotherapy, compression bandaging, moist heat, cryotherapy, contrast bath, patient/family education, and coping strategies training  RECOMMENDED OTHER SERVICES: none now - he does follow with rheumatology  CONSULTED AND AGREED WITH PLAN OF CARE: Patient  PLAN FOR NEXT SESSION:  Check initial recommendations and orthotic for fit and tolerance.  Assign complete home exercise program as able.  Assess and perform more functional ability.   Benito Mccreedy, OTR/L, CHT 02/18/2022, 12:31 PM

## 2022-02-18 ENCOUNTER — Ambulatory Visit: Payer: Managed Care, Other (non HMO) | Admitting: Rehabilitative and Restorative Service Providers"

## 2022-02-18 ENCOUNTER — Other Ambulatory Visit: Payer: Self-pay

## 2022-02-18 ENCOUNTER — Encounter: Payer: Self-pay | Admitting: Rehabilitative and Restorative Service Providers"

## 2022-02-18 DIAGNOSIS — M25642 Stiffness of left hand, not elsewhere classified: Secondary | ICD-10-CM

## 2022-02-18 DIAGNOSIS — R278 Other lack of coordination: Secondary | ICD-10-CM

## 2022-02-18 DIAGNOSIS — M79642 Pain in left hand: Secondary | ICD-10-CM | POA: Diagnosis not present

## 2022-02-18 DIAGNOSIS — M6281 Muscle weakness (generalized): Secondary | ICD-10-CM

## 2022-02-18 DIAGNOSIS — R600 Localized edema: Secondary | ICD-10-CM

## 2022-02-18 DIAGNOSIS — M25532 Pain in left wrist: Secondary | ICD-10-CM

## 2022-02-25 NOTE — Therapy (Signed)
OUTPATIENT OCCUPATIONAL THERAPY TREATMENT NOTE  Patient Name: Taylor Heath MRN: [id number] DOB:[birth year], 61 y.o., male Today's Date: 03/02/2022  PCP: Redmond School, MD  REFERRING PROVIDER:  Leandrew Koyanagi, MD    END OF SESSION:  OT End of Session - 03/02/22 0945     Visit Number 2    Number of Visits 12    Date for OT Re-Evaluation 04/02/22    Authorization Type [employer] Managed    OT Start Time 0945    OT Stop Time 1021    OT Time Calculation (min) 36 min    Activity Tolerance Patient limited by fatigue;Patient tolerated treatment well;No increased pain    Behavior During Therapy WFL for tasks assessed/performed              Past Medical History:  Diagnosis Date   Anxiety    Arthritis    GERD (gastroesophageal reflux disease)    History of kidney stones    Pneumonia    Past Surgical History:  Procedure Laterality Date   FINGER SURGERY Left    index   I & D EXTREMITY Left 01/27/2022   Procedure: IRRIGATION AND DEBRIDEMENT LEFT HAND;  Surgeon: Leandrew Koyanagi, MD;  Location: [facility];  Service: Orthopedics;  Laterality: Left;   I & D EXTREMITY Left 01/29/2022   Procedure: REPEAT IRRIGATION AND DEBRIDEMENT OF HAND;  Surgeon: Leandrew Koyanagi, MD;  Location: [facility];  Service: Orthopedics;  Laterality: Left;   JOINT REPLACEMENT     left hip replaced   KNEE ARTHROSCOPY W/ ACL RECONSTRUCTION Right 2009   LEG SURGERY Left    left calf   MINOR APPLICATION OF WOUND VAC  01/29/2022   Procedure: APPLICATION OF WOUND VAC;  Surgeon: Leandrew Koyanagi, MD;  Location: [facility];  Service: Orthopedics;;   ORIF FINGER / THUMB FRACTURE     TOTAL KNEE ARTHROPLASTY Right 01/27/2017   Procedure: RIGHT TOTAL KNEE ARTHROPLASTY;  Surgeon: Leandrew Koyanagi, MD;  Location: [facility];  Service: Orthopedics;  Laterality: Right;   Patient Active Problem List   Diagnosis Date Noted   Seropositive rheumatoid arthritis ([place]) 02/12/2022   High risk medication use 02/12/2022   Abscess of dorsum of left hand  01/26/2022   Extensor tenosynovitis of left wrist 12/01/2021   Primary osteoarthritis of right wrist 04/26/2017   Radiculopathy of cervical spine 04/26/2017   Total knee replacement status 01/27/2017   Primary osteoarthritis of right shoulder 12/30/2016   Primary osteoarthritis of right knee 12/30/2016    ONSET DATE: DOS 01/29/22 (I&D Lt hand) (Ext tendon repairs 12/10/21)  REFERRING DIAG:  L02.512 (ICD-10-CM) - Abscess of dorsum of left hand  M65.832 (ICD-10-CM) - Extensor tenosynovitis of left wrist    THERAPY DIAG:  Muscle weakness (generalized)  Localized edema  Other lack of coordination  Stiffness of left hand, not elsewhere classified  Pain in left wrist  Pain in left hand  Rationale for Evaluation and Treatment: Rehabilitation  PERTINENT HISTORY: 10 weeks post tenosynovectomy and ext tendon repairs, 3 weeks post most recent I& D sx.  Per MD: "Eval and treat s/p left wrist tenosynovectomy. Strengthening of long and ring finger extension...OT for hand rehab hopefully he can strengthen his long and ring finger. His ring finger extension strength has significantly improved. I would like to recheck him in about 3 weeks. He does have a follow-up with Dr. Gale Journey in about 3 weeks. "  He had left wrist extensor tendon repair/tenolysis for concern of impending tendon  rupture, complicated by early surgical site infection.  PRECAUTIONS: None  WEIGHT BEARING RESTRICTIONS: Now 11+ weeks out, he should use caution for 2-4 more week to avoid heavy/painful    SUBJECTIVE:   SUBJECTIVE STATEMENT: He states feeling like the more he does the loser it gets, more mobile. No real pain, has been wearing RMO, mainly at night.    PAIN:  Are you having pain?  None at rest Rating: 0/10 at rest now, up to 0/10 at worst in past week   PATIENT GOALS: To be able to return to golfing and full use of his left nondominant hand.   OBJECTIVE: (All objective assessments below are from initial  evaluation on: 02/18/22 unless otherwise specified.)   HAND DOMINANCE: Right   ADLs: Overall ADLs: States decreased ability to grab, hold household objects, pain and problems opening containers, perform FMS tasks (manipulate fasteners on clothing)  FUNCTIONAL OUTCOME MEASURES: Eval: Quck DASH 18% impairment today  (Higher % Score  =  More Impairment)    UPPER EXTREMITY ROM     Shoulder to Wrist AROM Right eval Left eval Lt 03/02/22  Forearm supination  85   Forearm pronation   85   Wrist flexion 59 49 42  Wrist extension 70 57 60  (Blank rows = not tested)   Hand AROM Left eval Lt 03/02/22  Full Fist Ability (or Gap to Distal Palmar Crease) Yes, loose fist   Thumb Opposition  (Kapandji Scale)  10   Long MCP (0-90) (-41) -  83 (-35) - 72  Long PIP (0-100) 0-100    Long DIP (0-70) 0-70    Ring MCP (0-90) (-21) -  82 (-27) - 81  Ring PIP (0-100)  0-95   Ring DIP (0-70) 0-70    (Blank rows = not tested)   UPPER EXTREMITY MMT:    Eval:  NT at eval due to recent and still healing injuries. Will be tested when appropriate.   MMT Left TBD  Elbow flexion   Elbow extension   Forearm supination   Forearm pronation   Wrist flexion   Wrist extension   (Blank rows = not tested)  HAND FUNCTION: 03/02/22: Grip strength Right: 71 lbs, Left: 70 lbs   Eval: Observed weakness in affected Lt hand, nt due to recent surgeries.    COORDINATION: Eval: Observed coordination impairments with affected Lt hand.Box and Blocks Test: Left 54 Blocks today (70 is AVG). FMS assessment TBD  EDEMA:   Eval:  Mildly swollen in hand and dorsum of wrist today   OBSERVATIONS:   03/02/22: His wrist and hand motion is improving somewhat with orthotic use.  His scar and surgical area are now well-healed though some obvious scar adhesion at the proximal and distal portion of the scar which may be limiting tendon excursion.  He continues to be able to extend his IP joints easily, but MCP joint still  lagging in middle and ring fingers only.  Eval: He has obvious deficiency with extensor tendons of left hand middle finger and ring fingers.  They are drooping and he is unable to extend his MCP joints, however with passive tenodesis he does show some finger extension at the MCP joints hopefully meaning extensor tendons are still intact, but significant extensor tendon lag from surgical complications with infections, and he likely overstretched them after surgery by getting into full fist and wrist flexion quickly (even potentially gapping his repair sutures).   TODAY'S TREATMENT:  03/02/22: He performs new measures months  of range of motion, showing some mild improvements with extension at the MCP joint of the middle finger now.  The orthotic was causing a bit of irritation along the dorsum of the small finger so OT adjusts orthotic to fit less snugly today.  He states it is much better now.  OT clarifies that he should be wearing it in the day, and at night is optional.  OT also home assigns a full exercise program for him including the below exercises, which he demonstrates back well with no increase in pain he should be doing these things 3-4 times a day, as well as scar mobilizations.  He was given nonslip material to help with friction and mobilizing scar.  OT does some manual scar mobilization including cupping techniques today to help lift the scar which she tolerates well.    Exercises - Scar Mobilizations 4-6 x day for 2 mins - Wrist Prayer Stretch  - 4 x daily - 3-5 reps - 15 sec hold - Wrist Flexion Stretch  - 4 x daily - 3-5 reps - 15 sec hold - HOOK Stretch  - 4 x daily - 3-5 reps - 15-20 sec hold - "Pencil Pushups" 20x, 4 x day (MCP J ext with IP J flexion)    PATIENT EDUCATION: Education details: See tx section above for details  Person educated: Patient Education method: Veterinary surgeon, Teach back, Handouts  Education comprehension: States and demonstrates understanding,  Additional Education required    HOME EXERCISE PROGRAM: See tx section above for details    GOALS: Goals reviewed with patient? Yes   SHORT TERM GOALS: (STG required if POC>30 days) Target Date: 03/12/22  Pt will obtain protective, custom orthotic. Goal status: PARTIALLY MET (may require additional orthotic support)   2.  Pt will demo/state understanding of initial HEP to improve pain levels and prerequisite motion. Goal status: INITIAL   LONG TERM GOALS: Target Date: 04/02/22   Pt will improve functional ability by decreased impairment per Quick DASH assessment from 18% to 7% or better, for better quality of life. Goal status: INITIAL  2.  Pt will improve grip strength in Lt hand to at least 50lbs for functional use at home and in IADLs. Goal status: INITIAL  3.  Pt will improve A/ROM in Lt MF and RF TAM from 212* and 226*, respectively to at least 230* each, to have functional motion for tasks like reach and grasp.  Goal status: INITIAL  4.  Pt will improve strength in Lt wrist ext from 3-/5 painful MMT to at least 4+/5 MMT to have increased functional ability to carry out selfcare and higher-level homecare tasks with no difficulty. Goal status: INITIAL  5.  Pt will improve coordination skills in Lt hand, as seen by better score on BBT testing to at least 65 Blocks have increased functional ability to carry out fine motor tasks (fasteners, etc.) and more complex, coordinated IADLs (meal prep, sports, etc.).  Goal status: INITIAL    ASSESSMENT:  CLINICAL IMPRESSION: 03/02/22: Motion somewhat improved today as well as tolerance to gripping and activities.  Surgery area well-healed now but somewhat adhered scars present.  Continue plan of care  Eval: Patient is a 61 y.o. male who was seen today for occupational therapy evaluation for repair of left hand extensor tendons, pain, stiffness and complications from infections, and decreased functional ability.  He will benefit from  outpatient occupational therapy to improve quality of life and functional ability   PLAN:  OT FREQUENCY:  1-2x/week  OT DURATION: 6 weeks (through 04/02/22 as needed)   PLANNED INTERVENTIONS: self care/ADL training, therapeutic exercise, therapeutic activity, neuromuscular re-education, manual therapy, scar mobilization, passive range of motion, splinting, electrical stimulation, ultrasound, fluidotherapy, compression bandaging, moist heat, cryotherapy, contrast bath, patient/family education, and coping strategies training  CONSULTED AND AGREED WITH PLAN OF CARE: Patient  PLAN FOR NEXT SESSION:  Check on orthotic use, home exercise program and new grip and motion measurements to determine the effect.    Benito Mccreedy, OTR/L, CHT 03/02/2022, 11:04 AM

## 2022-03-02 ENCOUNTER — Encounter: Payer: Self-pay | Admitting: Rehabilitative and Restorative Service Providers"

## 2022-03-02 ENCOUNTER — Ambulatory Visit: Payer: Managed Care, Other (non HMO) | Admitting: Rehabilitative and Restorative Service Providers"

## 2022-03-02 DIAGNOSIS — R278 Other lack of coordination: Secondary | ICD-10-CM

## 2022-03-02 DIAGNOSIS — M6281 Muscle weakness (generalized): Secondary | ICD-10-CM

## 2022-03-02 DIAGNOSIS — M25642 Stiffness of left hand, not elsewhere classified: Secondary | ICD-10-CM | POA: Diagnosis not present

## 2022-03-02 DIAGNOSIS — M25532 Pain in left wrist: Secondary | ICD-10-CM

## 2022-03-02 DIAGNOSIS — R600 Localized edema: Secondary | ICD-10-CM

## 2022-03-02 DIAGNOSIS — M79642 Pain in left hand: Secondary | ICD-10-CM

## 2022-03-08 NOTE — Therapy (Signed)
OUTPATIENT OCCUPATIONAL THERAPY TREATMENT NOTE  Patient Name: Taylor Heath MRN: [id number] DOB:09-13-1960, 62 y.o., male Today's Date: 03/08/2022  PCP: Elfredia Nevins, MD  REFERRING PROVIDER:  Tarry Kos, MD    END OF SESSION:     Past Medical History:  Diagnosis Date   Anxiety    Arthritis    GERD (gastroesophageal reflux disease)    History of kidney stones    Pneumonia    Past Surgical History:  Procedure Laterality Date   FINGER SURGERY Left    index   I & D EXTREMITY Left 01/27/2022   Procedure: IRRIGATION AND DEBRIDEMENT LEFT HAND;  Surgeon: Tarry Kos, MD;  [facility];  Service: Orthopedics;  Laterality: Left;   I & D EXTREMITY Left 01/29/2022   Procedure: REPEAT IRRIGATION AND DEBRIDEMENT OF HAND;  Surgeon: Tarry Kos, MD;  [facility];  Service: Orthopedics;  Laterality: Left;   JOINT REPLACEMENT     left hip replaced   KNEE ARTHROSCOPY W/ ACL RECONSTRUCTION Right 2009   LEG SURGERY Left    left calf   MINOR APPLICATION OF WOUND VAC  01/29/2022   Procedure: APPLICATION OF WOUND VAC;  Surgeon: Tarry Kos, MD;  Location[facility];  Service: Orthopedics;;   ORIF FINGER / THUMB FRACTURE     TOTAL KNEE ARTHROPLASTY Right 01/27/2017   Procedure: RIGHT TOTAL KNEE ARTHROPLASTY;  Surgeon: Tarry Kos, MD;  [facility];  Service: Orthopedics;  Laterality: Right;   Patient Active Problem List   Diagnosis Date Noted   Seropositive rheumatoid arthritis (HCC) 02/12/2022   High risk medication use 02/12/2022   Abscess of dorsum of left hand 01/26/2022   Extensor tenosynovitis of left wrist 12/01/2021   Primary osteoarthritis of right wrist 04/26/2017   Radiculopathy of cervical spine 04/26/2017   Total knee replacement status 01/27/2017   Primary osteoarthritis of right shoulder 12/30/2016   Primary osteoarthritis of right knee 12/30/2016    ONSET DATE: DOS 01/29/22 (I&D Lt hand) (Ext tendon repairs 12/10/21)  REFERRING DIAG:  L02.512  (ICD-10-CM) - Abscess of dorsum of left hand  M65.832 (ICD-10-CM) - Extensor tenosynovitis of left wrist    THERAPY DIAG:  No diagnosis found.  Rationale for Evaluation and Treatment: Rehabilitation  PERTINENT HISTORY: 11 weeks post tenosynovectomy and ext tendon repairs, 3 weeks post most recent I& D sx.  Per MD: "Eval and treat s/p left wrist tenosynovectomy. Strengthening of long and ring finger extension...OT for hand rehab hopefully he can strengthen his long and ring finger. His ring finger extension strength has significantly improved. I would like to recheck him in about 3 weeks. He does have a follow-up with Dr. Renold Don in about 3 weeks. "  He had left wrist extensor tendon repair/tenolysis for concern of impending tendon rupture, complicated by early surgical site infection.  PRECAUTIONS: None  WEIGHT BEARING RESTRICTIONS: Now 12+ weeks out, he should use caution for 2-4 more week to avoid heavy/painful    SUBJECTIVE:   SUBJECTIVE STATEMENT: He states ***  feeling like the more he does the loser it gets, more mobile. No real pain, has been wearing RMO, mainly at night.    PAIN:  Are you having pain?  *** None at rest Rating: 0/10 at rest now, up to 0/10 at worst in past week   PATIENT GOALS: To be able to return to golfing and full use of his left nondominant hand.   OBJECTIVE: (All objective assessments below are from initial  evaluation on: 02/18/22 unless otherwise specified.)   HAND DOMINANCE: Right   ADLs: Overall ADLs: States decreased ability to grab, hold household objects, pain and problems opening containers, perform FMS tasks (manipulate fasteners on clothing)  FUNCTIONAL OUTCOME MEASURES: Eval: Quck DASH 18% impairment today  (Higher % Score  =  More Impairment)    UPPER EXTREMITY ROM     Shoulder to Wrist AROM Right eval Left eval Lt 03/02/22 Lt 03/11/22  Forearm supination  85    Forearm pronation   85    Wrist flexion 59 49 42 ***  Wrist extension  70 57 60   (Blank rows = not tested)   Hand AROM Left eval Lt 03/02/22 Lt  03/11/22  Full Fist Ability (or Gap to Distal Palmar Crease) Yes, loose fist    Thumb Opposition  (Kapandji Scale)  10    Long MCP (0-90) (-41) -  83 (-35) - 72 (-***) - ***  Long PIP (0-100) 0-100     Long DIP (0-70) 0-70     Ring MCP (0-90) (-21) -  82 (-27) - 81 (-***) - ***  Ring PIP (0-100)  0-95    Ring DIP (0-70) 0-70     (Blank rows = not tested)   UPPER EXTREMITY MMT:    Eval:  NT at eval due to recent and still healing injuries. Will be tested when appropriate.   MMT Left TBD  Elbow flexion   Elbow extension   Forearm supination   Forearm pronation   Wrist flexion   Wrist extension   (Blank rows = not tested)  HAND FUNCTION: 03/02/22: Grip strength Right: 71 lbs, Left: 70 lbs   Eval: Observed weakness in affected Lt hand, nt due to recent surgeries.    COORDINATION: Eval: Observed coordination impairments with affected Lt hand.Box and Blocks Test: Left 54 Blocks today (70 is AVG). FMS assessment TBD  EDEMA:   Eval:  Mildly swollen in hand and dorsum of wrist today   OBSERVATIONS:   03/02/22: His wrist and hand motion is improving somewhat with orthotic use.  His scar and surgical area are now well-healed though some obvious scar adhesion at the proximal and distal portion of the scar which may be limiting tendon excursion.  He continues to be able to extend his IP joints easily, but MCP joint still lagging in middle and ring fingers only.  Eval: He has obvious deficiency with extensor tendons of left hand middle finger and ring fingers.  They are drooping and he is unable to extend his MCP joints, however with passive tenodesis he does show some finger extension at the MCP joints hopefully meaning extensor tendons are still intact, but significant extensor tendon lag from surgical complications with infections, and he likely overstretched them after surgery by getting into full fist and  wrist flexion quickly (even potentially gapping his repair sutures).   TODAY'S TREATMENT:  03/11/22: ***Check on orthotic use, home exercise program and new grip and motion measurements to determine the effect.   03/02/22: He performs new measures months of range of motion, showing some mild improvements with extension at the MCP joint of the middle finger now.  The orthotic was causing a bit of irritation along the dorsum of the small finger so OT adjusts orthotic to fit less snugly today.  He states it is much better now.  OT clarifies that he should be wearing it in the day, and at night is optional.  OT also home assigns a  full exercise program for him including the below exercises, which he demonstrates back well with no increase in pain he should be doing these things 3-4 times a day, as well as scar mobilizations.  He was given nonslip material to help with friction and mobilizing scar.  OT does some manual scar mobilization including cupping techniques today to help lift the scar which she tolerates well.    Exercises - Scar Mobilizations 4-6 x day for 2 mins - Wrist Prayer Stretch  - 4 x daily - 3-5 reps - 15 sec hold - Wrist Flexion Stretch  - 4 x daily - 3-5 reps - 15 sec hold - HOOK Stretch  - 4 x daily - 3-5 reps - 15-20 sec hold - "Pencil Pushups" 20x, 4 x day (MCP J ext with IP J flexion)    PATIENT EDUCATION: Education details: See tx section above for details  Person educated: Patient Education method: Engineer, structural, Teach back, Handouts  Education comprehension: States and demonstrates understanding, Additional Education required    HOME EXERCISE PROGRAM: See tx section above for details    GOALS: Goals reviewed with patient? Yes   SHORT TERM GOALS: (STG required if POC>30 days) Target Date: 03/12/22  Pt will obtain protective, custom orthotic. Goal status: PARTIALLY MET (may require additional orthotic support)   2.  Pt will demo/state understanding of  initial HEP to improve pain levels and prerequisite motion. Goal status: INITIAL   LONG TERM GOALS: Target Date: 04/02/22   Pt will improve functional ability by decreased impairment per Quick DASH assessment from 18% to 7% or better, for better quality of life. Goal status: INITIAL  2.  Pt will improve grip strength in Lt hand to at least 50lbs for functional use at home and in IADLs. Goal status: INITIAL  3.  Pt will improve A/ROM in Lt MF and RF TAM from 212* and 226*, respectively to at least 230* each, to have functional motion for tasks like reach and grasp.  Goal status: INITIAL  4.  Pt will improve strength in Lt wrist ext from 3-/5 painful MMT to at least 4+/5 MMT to have increased functional ability to carry out selfcare and higher-level homecare tasks with no difficulty. Goal status: INITIAL  5.  Pt will improve coordination skills in Lt hand, as seen by better score on BBT testing to at least 65 Blocks have increased functional ability to carry out fine motor tasks (fasteners, etc.) and more complex, coordinated IADLs (meal prep, sports, etc.).  Goal status: INITIAL    ASSESSMENT:  CLINICAL IMPRESSION: 03/11/22: ***  03/02/22: Motion somewhat improved today as well as tolerance to gripping and activities.  Surgery area well-healed now but somewhat adhered scars present.  Continue plan of care  Eval: Patient is a 62 y.o. male who was seen today for occupational therapy evaluation for repair of left hand extensor tendons, pain, stiffness and complications from infections, and decreased functional ability.  He will benefit from outpatient occupational therapy to improve quality of life and functional ability   PLAN:  OT FREQUENCY: 1-2x/week  OT DURATION: 6 weeks (through 04/02/22 as needed)   PLANNED INTERVENTIONS: self care/ADL training, therapeutic exercise, therapeutic activity, neuromuscular re-education, manual therapy, scar mobilization, passive range of motion,  splinting, electrical stimulation, ultrasound, fluidotherapy, compression bandaging, moist heat, cryotherapy, contrast bath, patient/family education, and coping strategies training  CONSULTED AND AGREED WITH PLAN OF CARE: Patient  PLAN FOR NEXT SESSION:  ***  Fannie Knee, OTR/L, CHT 03/08/2022, 4:38  PM

## 2022-03-09 ENCOUNTER — Encounter: Payer: Self-pay | Admitting: Orthopaedic Surgery

## 2022-03-09 ENCOUNTER — Telehealth: Payer: Self-pay | Admitting: Orthopaedic Surgery

## 2022-03-09 ENCOUNTER — Ambulatory Visit: Payer: Managed Care, Other (non HMO) | Admitting: Physician Assistant

## 2022-03-09 ENCOUNTER — Encounter: Payer: Self-pay | Admitting: Internal Medicine

## 2022-03-09 DIAGNOSIS — M65832 Other synovitis and tenosynovitis, left forearm: Secondary | ICD-10-CM

## 2022-03-09 NOTE — Telephone Encounter (Signed)
[employer] disability forms received. To [employer]. 

## 2022-03-09 NOTE — Progress Notes (Signed)
Post-Op Visit Note   Patient: Taylor Heath           Date of Birth: [birth year]           MRN: [id number] Visit Date: 03/09/2022 PCP: Redmond School, MD   Assessment & Plan:  Chief Complaint:  Chief Complaint  Patient presents with   Left Hand - Follow-up    I&D of left hand 01/30/2023   Visit Diagnoses:  1. Extensor tenosynovitis of left wrist     Plan: Patient is a very pleasant 62 year old gentleman who comes in today approximately 4 weeks status post left hand abscess I&D 01/29/2022.  He has been doing significantly better.  He is in minimal to no pain.  He is still on doxycycline per ID.  He is also in hand therapy making good progress.  Examination of left hand reveals a fully healed surgical scar without complication.  He has full flexion and extension but does still lack slight strength with resisted extension of the ring and long fingers.  At this point, would like for him to continue with hand therapy.  He will continue with antibiotics per Dr. Gale Journey.  He will follow-up with [place] in 4 weeks for recheck.  Call with concerns or questions.  Follow-Up Instructions: Return in about 4 weeks (around 04/06/2022).   Orders:  No orders of the defined types were placed in this encounter.  No orders of the defined types were placed in this encounter.   Imaging: No new imaging  PMFS History: Patient Active Problem List   Diagnosis Date Noted   Seropositive rheumatoid arthritis ([place]) 02/12/2022   High risk medication use 02/12/2022   Abscess of dorsum of left hand 01/26/2022   Extensor tenosynovitis of left wrist 12/01/2021   Primary osteoarthritis of right wrist 04/26/2017   Radiculopathy of cervical spine 04/26/2017   Total knee replacement status 01/27/2017   Primary osteoarthritis of right shoulder 12/30/2016   Primary osteoarthritis of right knee 12/30/2016   Past Medical History:  Diagnosis Date   Anxiety    Arthritis    GERD (gastroesophageal reflux disease)     History of kidney stones    Pneumonia     Family History  Problem Relation Age of Onset   Rheum arthritis Father     Past Surgical History:  Procedure Laterality Date   FINGER SURGERY Left    index   I & D EXTREMITY Left 01/27/2022   Procedure: IRRIGATION AND DEBRIDEMENT LEFT HAND;  Surgeon: Leandrew Koyanagi, MD;  Location: [place];  Service: Orthopedics;  Laterality: Left;   I & D EXTREMITY Left 01/29/2022   Procedure: REPEAT IRRIGATION AND DEBRIDEMENT OF HAND;  Surgeon: Leandrew Koyanagi, MD;  Location: [place];  Service: Orthopedics;  Laterality: Left;   JOINT REPLACEMENT     left hip replaced   KNEE ARTHROSCOPY W/ ACL RECONSTRUCTION Right 2009   LEG SURGERY Left    left calf   MINOR APPLICATION OF WOUND VAC  01/29/2022   Procedure: APPLICATION OF WOUND VAC;  Surgeon: Leandrew Koyanagi, MD;  Location: [place];  Service: Orthopedics;;   ORIF FINGER / THUMB FRACTURE     TOTAL KNEE ARTHROPLASTY Right 01/27/2017   Procedure: RIGHT TOTAL KNEE ARTHROPLASTY;  Surgeon: Leandrew Koyanagi, MD;  Location: [place];  Service: Orthopedics;  Laterality: Right;   Social History   Occupational History   Not on file  Tobacco Use   Smoking status: Former    Types: Cigarettes  Quit date: 04/21/2010    Years since quitting: 11.8    Passive exposure: Never   Smokeless tobacco: Never  Vaping Use   Vaping Use: Never used  Substance and Sexual Activity   Alcohol use: No   Drug use: No   Sexual activity: Not on file

## 2022-03-11 ENCOUNTER — Encounter: Payer: Self-pay | Admitting: Rehabilitative and Restorative Service Providers"

## 2022-03-11 ENCOUNTER — Ambulatory Visit: Payer: Managed Care, Other (non HMO) | Admitting: Rehabilitative and Restorative Service Providers"

## 2022-03-11 DIAGNOSIS — M25642 Stiffness of left hand, not elsewhere classified: Secondary | ICD-10-CM

## 2022-03-11 DIAGNOSIS — R278 Other lack of coordination: Secondary | ICD-10-CM | POA: Diagnosis not present

## 2022-03-11 DIAGNOSIS — M79642 Pain in left hand: Secondary | ICD-10-CM

## 2022-03-11 DIAGNOSIS — M6281 Muscle weakness (generalized): Secondary | ICD-10-CM

## 2022-03-11 DIAGNOSIS — R600 Localized edema: Secondary | ICD-10-CM

## 2022-03-11 DIAGNOSIS — M25532 Pain in left wrist: Secondary | ICD-10-CM

## 2022-03-12 ENCOUNTER — Other Ambulatory Visit: Payer: Managed Care, Other (non HMO)

## 2022-03-12 NOTE — Therapy (Signed)
OUTPATIENT OCCUPATIONAL THERAPY TREATMENT NOTE  Patient Name: Taylor Heath MRN: [id number] DOB:[birth year], 62 y.o., male Today's Date: 03/16/2022  PCP: Elfredia Nevins, MD  REFERRING PROVIDER:  Tarry Kos, MD    END OF SESSION:  OT End of Session - 03/16/22 1432     Visit Number 4    Number of Visits 12    Date for OT Re-Evaluation 04/02/22    Authorization Type [employer] Managed    OT Start Time 1432    OT Stop Time 1513    OT Time Calculation (min) 41 min    Equipment Utilized During Treatment --    Activity Tolerance Patient tolerated treatment well;No increased pain    Behavior During Therapy WFL for tasks assessed/performed            Past Medical History:  Diagnosis Date   Anxiety    Arthritis    GERD (gastroesophageal reflux disease)    History of kidney stones    Pneumonia    Past Surgical History:  Procedure Laterality Date   FINGER SURGERY Left    index   I & D EXTREMITY Left 01/27/2022   Procedure: IRRIGATION AND DEBRIDEMENT LEFT HAND;  Surgeon: Tarry Kos, MD;  [facility];  Service: Orthopedics;  Laterality: Left;   I & D EXTREMITY Left 01/29/2022   Procedure: REPEAT IRRIGATION AND DEBRIDEMENT OF HAND;  Surgeon: Tarry Kos, MD;  [facility];  Service: Orthopedics;  Laterality: Left;   JOINT REPLACEMENT     left hip replaced   KNEE ARTHROSCOPY W/ ACL RECONSTRUCTION Right 2009   LEG SURGERY Left    left calf   MINOR APPLICATION OF WOUND VAC  01/29/2022   Procedure: APPLICATION OF WOUND VAC;  Surgeon: Tarry Kos, MD;  [facility];  Service: Orthopedics;;   ORIF FINGER / THUMB FRACTURE     TOTAL KNEE ARTHROPLASTY Right 01/27/2017   Procedure: RIGHT TOTAL KNEE ARTHROPLASTY;  Surgeon: Tarry Kos, MD;  [facility];  Service: Orthopedics;  Laterality: Right;   Patient Active Problem List   Diagnosis Date Noted   Seropositive rheumatoid arthritis (HCC) 02/12/2022   High risk medication use 02/12/2022   Abscess of dorsum of  left hand 01/26/2022   Extensor tenosynovitis of left wrist 12/01/2021   Primary osteoarthritis of right wrist 04/26/2017   Radiculopathy of cervical spine 04/26/2017   Total knee replacement status 01/27/2017   Primary osteoarthritis of right shoulder 12/30/2016   Primary osteoarthritis of right knee 12/30/2016    ONSET DATE: DOS 01/29/22 (I&D Lt hand) (Ext tendon repairs 12/10/21)  REFERRING DIAG:  L02.512 (ICD-10-CM) - Abscess of dorsum of left hand  M65.832 (ICD-10-CM) - Extensor tenosynovitis of left wrist    THERAPY DIAG:  Muscle weakness (generalized)  Localized edema  Stiffness of left hand, not elsewhere classified  Other lack of coordination  Pain in left wrist  Rationale for Evaluation and Treatment: Rehabilitation  PERTINENT HISTORY: 12+ weeks post tenosynovectomy and ext tendon repairs, 6+ weeks post most recent I& D sx.  Per MD: "Eval and treat s/p left wrist tenosynovectomy. Strengthening of long and ring finger extension...OT for hand rehab hopefully he can strengthen his long and ring finger. His ring finger extension strength has significantly improved. I would like to recheck him in about 3 weeks. He does have a follow-up with Dr. Renold Don in about 3 weeks. "  He had left wrist extensor tendon repair/tenolysis for concern of impending tendon rupture, complicated  by early surgical site infection.  PRECAUTIONS: None; WEIGHT BEARING RESTRICTIONS: None now    SUBJECTIVE:   SUBJECTIVE STATEMENT: He states doing HEP, golfing, feeling better, feeling stretches effecting scar and hand.     PAIN:  Are you having pain?   None at rest Rating: 0/10 at rest now, up to 0/10 at worst in past week   PATIENT GOALS: To be able to return to golfing and full use of his left nondominant hand.   OBJECTIVE: (All objective assessments below are from initial evaluation on: 02/18/22 unless otherwise specified.)   HAND DOMINANCE: Right   ADLs: Overall ADLs: States decreased  ability to grab, hold household objects, pain and problems opening containers, perform FMS tasks (manipulate fasteners on clothing)  FUNCTIONAL OUTCOME MEASURES: Eval: Quck DASH 18% impairment today  (Higher % Score  =  More Impairment)    UPPER EXTREMITY ROM     Shoulder to Wrist AROM Right eval Left eval Lt 03/02/22 Lt 03/11/22 Lt 03/16/22  Forearm supination  85     Forearm pronation   85     Wrist flexion 59 49 42 57 62  Wrist extension 70 57 60 62 69  (Blank rows = not tested)   Hand AROM Left eval Lt 03/02/22 Lt  03/11/22 Lt 03/16/22  Full Fist Ability (or Gap to Distal Palmar Crease) Yes, loose fist     Thumb Opposition  (Kapandji Scale)  10     Long MCP (0-90) (-41) -  83 (-35) - 72 (-30) - 83 (-35) - 83  Long PIP (0-100) 0-100   0-104 0- 105  Long DIP (0-70) 0-70   0-75 0-65  Ring MCP (0-90) (-21) -  82 (-27) - 81 (-29) - 83 (-24) - 80  Ring PIP (0-100)  0-95  0-99 0-100  Ring DIP (0-70) 0-70   0-70 0-70  (Blank rows = not tested)   UPPER EXTREMITY MMT:     MMT Left 03/16/22  Wrist flexion 4+/5  Wrist extension 4/5  (Blank rows = not tested)  HAND FUNCTION: 03/02/22: Grip strength Right: 71 lbs, Left: 70 lbs   Eval: Observed weakness in affected Lt hand, nt due to recent surgeries.    COORDINATION: Eval: Observed coordination impairments with affected Lt hand.Box and Blocks Test: Left 54 Blocks today (70 is AVG).   EDEMA:   Eval:  Mildly swollen in hand and dorsum of wrist today   OBSERVATIONS:   03/16/22:  His scar is still obviously adhered to the structures below, though somewhat better, remains an issue.  Wound completely healed. Overt intrinsic tightness in hand but mainly a lag in extension for digits 3, 4.   TODAY'S TREATMENT:  03/16/22: He performs active range of motion again for measurements shack and does much better at the wrist but no significant change in the hand now.  It seems his progress is slowing.  Orthotic is still working and fitting  well.  Entire exercise program was reviewed as below, and it still appropriate as stretching and strengthening do not bother him but he feels the appropriate tensions in his hand and wrist.  OT also does cough scar mobilizations with nonslip materials as well as cupping tools again today.  He still continues to state benefit and a little bit looser at the end, but scars are still stubbornly thick and adherent.  Exercises - Wrist Flexion Stretch  - 4 x daily - 3-5 reps - 15 sec hold - Wrist Prayer Stretch  -  4 x daily - 3-5 reps - 15 sec hold - HOOK Stretch  - 4 x daily - 3-5 reps - 15-20 sec hold - Finger Extension or EDC Glides: Pen Roll From Fist to [employer]  - 4-6 x daily - 1 sets - 10-15 reps - Wrist Extension with Resistance  - 2-4 x daily - 1-2 sets - 10-15 reps - Resisted Finger Extension and Thumb Abduction  - 4-6 x daily - 1 sets - 10-15 reps Patient Education - Scar Massage   PATIENT EDUCATION: Education details: See tx section above for details  Person educated: Patient Education method: Verbal Instruction, Teach back, Handouts  Education comprehension: States and demonstrates understanding, Additional Education required    HOME EXERCISE PROGRAM: Access Code: [email or web address] URL: [email or web address]  GOALS: Goals reviewed with patient? Yes   SHORT TERM GOALS: (STG required if POC>30 days) Target Date: 03/12/22  Pt will obtain protective, custom orthotic. Goal status: PARTIALLY MET (may require additional orthotic support)   2.  Pt will demo/state understanding of initial HEP to improve pain levels and prerequisite motion. Goal status: INITIAL   LONG TERM GOALS: Target Date: 04/02/22   Pt will improve functional ability by decreased impairment per Quick DASH assessment from 18% to 7% or better, for better quality of life. Goal status: INITIAL  2.  Pt will improve grip strength in Lt hand to at least 50lbs for functional use at home and in IADLs. Goal  status: INITIAL  3.  Pt will improve A/ROM in Lt MF and RF TAM from 212* and 226*, respectively to at least 230* each, to have functional motion for tasks like reach and grasp.  Goal status: INITIAL  4.  Pt will improve strength in Lt wrist ext from 3-/5 painful MMT to at least 4+/5 MMT to have increased functional ability to carry out selfcare and higher-level homecare tasks with no difficulty. Goal status: INITIAL  5.  Pt will improve coordination skills in Lt hand, as seen by better score on BBT testing to at least 65 Blocks have increased functional ability to carry out fine motor tasks (fasteners, etc.) and more complex, coordinated IADLs (meal prep, sports, etc.).  Goal status: INITIAL    ASSESSMENT:  CLINICAL IMPRESSION: 03/16/22: It seems that the progress he was making and his hand has been swelling somewhat.  He is doing much better than at the start of therapy, but we will put off next treatment for 2 weeks to allow for additional healing time.  He has a great comprehensive plan of care as well as orthotic for support.  The goal is to shrink and contract the lagging extensor tendons and try to break up the adherent scars.  He is understanding this and in agreement he will return in about 2 weeks.  He may be maximizing his therapy potential if progress continues to plateau.  He was educated that he could seek surgical intervention like tenolysis or other methods to free up the adherent scar, if the surgeon felt that appropriate and if he wished.   PLAN:  OT FREQUENCY: 1-2x/week  OT DURATION: 6 weeks (through 04/02/22 as needed)   PLANNED INTERVENTIONS: self care/ADL training, therapeutic exercise, therapeutic activity, neuromuscular re-education, manual therapy, scar mobilization, passive range of motion, splinting, electrical stimulation, ultrasound, fluidotherapy, compression bandaging, moist heat, cryotherapy, contrast bath, patient/family education, and coping strategies  training  CONSULTED AND AGREED WITH PLAN OF CARE: Patient  PLAN FOR NEXT SESSION:  See back in about 2 weeks for  progress note/reassessment of functional ability grip strength and motion in any problems.  Determine need for additional therapy at that point.  Fannie Knee, OTR/L, CHT 03/16/2022, 4:39 PM

## 2022-03-13 LAB — PSA, TOTAL AND FREE
PSA, Free Pct: 21.3 %
PSA, Free: 0.49 ng/mL
Prostate Specific Ag, Serum: 2.3 ng/mL (ref 0.0–4.0)

## 2022-03-16 ENCOUNTER — Encounter: Payer: Self-pay | Admitting: Rehabilitative and Restorative Service Providers"

## 2022-03-16 ENCOUNTER — Ambulatory Visit: Payer: Managed Care, Other (non HMO) | Admitting: Rehabilitative and Restorative Service Providers"

## 2022-03-16 DIAGNOSIS — M25642 Stiffness of left hand, not elsewhere classified: Secondary | ICD-10-CM

## 2022-03-16 DIAGNOSIS — R278 Other lack of coordination: Secondary | ICD-10-CM

## 2022-03-16 DIAGNOSIS — R600 Localized edema: Secondary | ICD-10-CM | POA: Diagnosis not present

## 2022-03-16 DIAGNOSIS — M6281 Muscle weakness (generalized): Secondary | ICD-10-CM | POA: Diagnosis not present

## 2022-03-16 DIAGNOSIS — M25532 Pain in left wrist: Secondary | ICD-10-CM

## 2022-03-19 ENCOUNTER — Ambulatory Visit: Payer: No Typology Code available for payment source | Admitting: Urology

## 2022-03-19 ENCOUNTER — Encounter: Payer: Self-pay | Admitting: Urology

## 2022-03-19 VITALS — BP 156/69 | HR 78

## 2022-03-19 DIAGNOSIS — R972 Elevated prostate specific antigen [PSA]: Secondary | ICD-10-CM

## 2022-03-19 LAB — MICROSCOPIC EXAMINATION: Bacteria, UA: NONE SEEN

## 2022-03-19 LAB — URINALYSIS, ROUTINE W REFLEX MICROSCOPIC
Bilirubin, UA: NEGATIVE
Glucose, UA: NEGATIVE
Ketones, UA: NEGATIVE
Leukocytes,UA: NEGATIVE
Nitrite, UA: NEGATIVE
Specific Gravity, UA: 1.02 (ref 1.005–1.030)
Urobilinogen, Ur: 0.2 mg/dL (ref 0.2–1.0)
pH, UA: 7 (ref 5.0–7.5)

## 2022-03-19 NOTE — Progress Notes (Signed)
03/19/2022 10:55 AM   Taylor Heath 19-Apr-1960 [id number]  Referring provider: [facility] [address],  [place] [address]  Followup elevated PSA   HPI: Taylor Heath is a 62yo here for followup for elevated PSA. PSA decreased to 2.3 from 4.5. He had a staph infection in his hand and was on doxycycline 100mg  BID for 6 weeks. No significant LUTS. Urine stream strong. No straining to urinate.    PMH: Past Medical History:  Diagnosis Date   Anxiety    Arthritis    GERD (gastroesophageal reflux disease)    History of kidney stones    Pneumonia     Surgical History: Past Surgical History:  Procedure Laterality Date   FINGER SURGERY Left    index   I & D EXTREMITY Left 01/27/2022   Procedure: IRRIGATION AND DEBRIDEMENT LEFT HAND;  Surgeon: Leandrew Koyanagi, MD;  Location: [place];  Service: Orthopedics;  Laterality: Left;   I & D EXTREMITY Left 01/29/2022   Procedure: REPEAT IRRIGATION AND DEBRIDEMENT OF HAND;  Surgeon: Leandrew Koyanagi, MD;  Location: [place];  Service: Orthopedics;  Laterality: Left;   JOINT REPLACEMENT     left hip replaced   KNEE ARTHROSCOPY W/ ACL RECONSTRUCTION Right 2009   LEG SURGERY Left    left calf   MINOR APPLICATION OF WOUND VAC  01/29/2022   Procedure: APPLICATION OF WOUND VAC;  Surgeon: Leandrew Koyanagi, MD;  Location: [place];  Service: Orthopedics;;   ORIF FINGER / THUMB FRACTURE     TOTAL KNEE ARTHROPLASTY Right 01/27/2017   Procedure: RIGHT TOTAL KNEE ARTHROPLASTY;  Surgeon: Leandrew Koyanagi, MD;  Location: [place];  Service: Orthopedics;  Laterality: Right;    Home Medications:  Allergies as of 03/19/2022       Reactions   Meperidine    Unknown reaction         Medication List        Accurate as of March 19, 2022 10:55 AM. If you have any questions, ask your nurse or doctor.          acetaminophen 325 MG tablet Commonly known as: TYLENOL Take 650 mg by mouth every 6 (six) hours as needed for  moderate pain.   diclofenac 75 MG EC tablet Commonly known as: VOLTAREN Take 75 mg by mouth daily.   FISH OIL PO Take 900 mg by mouth daily.   HYDROcodone-acetaminophen 5-325 MG tablet Commonly known as: Norco Take 1 tablet by mouth 3 (three) times daily as needed. To be taken after surgery   MULTIVITAMIN PO Take 1 tablet daily by mouth.   mupirocin ointment 2 % Commonly known as: BACTROBAN Apply 1 Application topically daily.   omeprazole 20 MG tablet Commonly known as: PRILOSEC OTC Take 20 mg daily by mouth.   ondansetron 4 MG tablet Commonly known as: Zofran Take 1 tablet (4 mg total) by mouth every 8 (eight) hours as needed for nausea or vomiting.   pravastatin 40 MG tablet Commonly known as: PRAVACHOL Take 40 mg daily by mouth.   valACYclovir 500 MG tablet Commonly known as: VALTREX Take 500 mg daily by mouth.        Allergies:  Allergies  Allergen Reactions   Meperidine     Unknown reaction     Family History: Family History  Problem Relation Age of Onset   Rheum arthritis Father     Social History:  reports that he quit smoking about  11 years ago. His smoking use included cigarettes. He has never been exposed to tobacco smoke. He has never used smokeless tobacco. He reports that he does not drink alcohol and does not use drugs.  ROS: All other review of systems were reviewed and are negative except what is noted above in HPI  Physical Exam: BP (!) 156/69   Pulse 78   Constitutional:  Alert and oriented, No acute distress. HEENT: [place] AT, moist mucus membranes.  Trachea midline, no masses. Cardiovascular: No clubbing, cyanosis, or edema. Respiratory: Normal respiratory effort, no increased work of breathing. GI: Abdomen is soft, nontender, nondistended, no abdominal masses GU: No CVA tenderness.  Lymph: No cervical or inguinal lymphadenopathy. Skin: No rashes, bruises or suspicious lesions. Neurologic: Grossly intact, no focal deficits, moving  all 4 extremities. Psychiatric: Normal mood and affect.  Laboratory Data: Lab Results  Component Value Date   WBC 6.0 02/09/2022   HGB 15.0 02/09/2022   HCT 43.8 02/09/2022   MCV 90.7 02/09/2022   PLT 316 02/09/2022    Lab Results  Component Value Date   CREATININE 0.97 02/09/2022    No results found for: "PSA"  No results found for: "TESTOSTERONE"  No results found for: "HGBA1C"  Urinalysis    Component Value Date/Time   COLORURINE STRAW (A) 01/27/2017 1038   APPEARANCEUR Clear 09/09/2021 1557   LABSPEC 1.008 01/27/2017 1038   PHURINE 6.0 01/27/2017 1038   GLUCOSEU Negative 09/09/2021 1557   HGBUR MODERATE (A) 01/27/2017 1038   BILIRUBINUR Negative 09/09/2021 1557   KETONESUR NEGATIVE 01/27/2017 1038   PROTEINUR Negative 09/09/2021 1557   PROTEINUR NEGATIVE 01/27/2017 1038   NITRITE Negative 09/09/2021 1557   NITRITE NEGATIVE 01/27/2017 1038   LEUKOCYTESUR Negative 09/09/2021 1557    Lab Results  Component Value Date   LABMICR Comment 09/09/2021   BACTERIA NONE SEEN 01/27/2017    Pertinent Imaging:  No results found for this or any previous visit.  No results found for this or any previous visit.  No results found for this or any previous visit.  No results found for this or any previous visit.  No results found for this or any previous visit.  No valid procedures specified. No results found for this or any previous visit.  No results found for this or any previous visit.   Assessment & Plan:    1. Elevated PSA -Followup 6 months with PSA - Urinalysis, Routine w reflex microscopic   No follow-ups on file.  Nicolette Bang, MD  [facility] [place]

## 2022-03-19 NOTE — Patient Instructions (Signed)

## 2022-03-23 ENCOUNTER — Telehealth: Payer: Self-pay

## 2022-03-23 NOTE — Telephone Encounter (Signed)
Tried calling patient with no answer, Left vm for return call

## 2022-03-23 NOTE — Telephone Encounter (Signed)
-----  Message from Patrick L McKenzie, MD sent at 03/23/2022  9:02 AM EST ----- normal ----- Message ----- From: Maddalynn Barnard R, CMA Sent: 03/15/2022   9:53 AM EST To: Patrick L McKenzie, MD  Please review  

## 2022-03-24 NOTE — Telephone Encounter (Signed)
Made patient aware that his PSA was normal. Patient has been scheduled for PSA labs. Patient voiced understanding.

## 2022-03-24 NOTE — Telephone Encounter (Signed)
-----  Message from Cleon Gustin, MD sent at 03/23/2022  9:02 AM EST ----- normal ----- Message ----- From: Sherrilyn Rist, CMA Sent: 03/15/2022   9:53 AM EST To: Cleon Gustin, MD  Please review

## 2022-03-29 NOTE — Therapy (Addendum)
OUTPATIENT OCCUPATIONAL THERAPY TREATMENT & DISCHARGE NOTE  Patient Name: Taylor Heath MRN: [id number] DOB:09-07-1960, 62 y.o., male Today's Date: 04/01/2022  PCP: Redmond School, MD  REFERRING PROVIDER:  Leandrew Koyanagi, MD  Progress Note Reporting Period 02/18/22 to 04/01/22  See note below for Objective Data and Assessment of Progress/Goals.     END OF SESSION:  OT End of Session - 04/01/22 1022     Visit Number 5    Number of Visits 12    Date for OT Re-Evaluation 04/23/22    Authorization Type [employer] Managed    OT Start Time 1022    OT Stop Time 1052    OT Time Calculation (min) 30 min    Activity Tolerance Patient tolerated treatment well;No increased pain    Behavior During Therapy WFL for tasks assessed/performed             Past Medical History:  Diagnosis Date   Anxiety    Arthritis    GERD (gastroesophageal reflux disease)    History of kidney stones    Pneumonia    Past Surgical History:  Procedure Laterality Date   FINGER SURGERY Left    index   I & D EXTREMITY Left 01/27/2022   Procedure: IRRIGATION AND DEBRIDEMENT LEFT HAND;  Surgeon: Leandrew Koyanagi, MD;  Location: [place];  Service: Orthopedics;  Laterality: Left;   I & D EXTREMITY Left 01/29/2022   Procedure: REPEAT IRRIGATION AND DEBRIDEMENT OF HAND;  Surgeon: Leandrew Koyanagi, MD;  Location: [place];  Service: Orthopedics;  Laterality: Left;   JOINT REPLACEMENT     left hip replaced   KNEE ARTHROSCOPY W/ ACL RECONSTRUCTION Right 2009   LEG SURGERY Left    left calf   MINOR APPLICATION OF WOUND VAC  01/29/2022   Procedure: APPLICATION OF WOUND VAC;  Surgeon: Leandrew Koyanagi, MD;  Location: [facility] [place];  Service: Orthopedics;;   ORIF FINGER / THUMB FRACTURE     TOTAL KNEE ARTHROPLASTY Right 01/27/2017   Procedure: RIGHT TOTAL KNEE ARTHROPLASTY;  Surgeon: Leandrew Koyanagi, MD;  Location: [place];  Service: Orthopedics;  Laterality: Right;   Patient Active Problem List   Diagnosis Date Noted   Seropositive  rheumatoid arthritis ([place]) 02/12/2022   High risk medication use 02/12/2022   Abscess of dorsum of left hand 01/26/2022   Extensor tenosynovitis of left wrist 12/01/2021   Primary osteoarthritis of right wrist 04/26/2017   Radiculopathy of cervical spine 04/26/2017   Total knee replacement status 01/27/2017   Primary osteoarthritis of right shoulder 12/30/2016   Primary osteoarthritis of right knee 12/30/2016    ONSET DATE: DOS 01/29/22 (I&D Lt hand) (Ext tendon repairs 12/10/21)  REFERRING DIAG:  L02.512 (ICD-10-CM) - Abscess of dorsum of left hand  M65.832 (ICD-10-CM) - Extensor tenosynovitis of left wrist    THERAPY DIAG:  Muscle weakness (generalized)  Localized edema  Other lack of coordination  Pain in left wrist  Stiffness of left hand, not elsewhere classified  Pain in left hand  Rationale for Evaluation and Treatment: Rehabilitation  PERTINENT HISTORY: 14+ weeks post tenosynovectomy and ext tendon repairs, 6+ weeks post most recent I& D sx.  Per MD: "Eval and treat s/p left wrist tenosynovectomy. Strengthening of long and ring finger extension...OT for hand rehab hopefully he can strengthen his long and ring finger. His ring finger extension strength has significantly improved. I would like to recheck him in about 3 weeks. He does have a follow-up with Dr. Gale Journey  in about 3 weeks. "  He had left wrist extensor tendon repair/tenolysis for concern of impending tendon rupture, complicated by early surgical site infection.  PRECAUTIONS: None; WEIGHT BEARING RESTRICTIONS: None now    SUBJECTIVE:   SUBJECTIVE STATEMENT: He states playing 8 days of golf in a row and doing very well. Stiffness is improving but scar is still limiting    PAIN:  Are you having pain?  None at rest Rating: 0/10 at rest now, up to 0/10 at worst in past week   PATIENT GOALS: To be able to return to golfing and full use of his left nondominant hand.   OBJECTIVE: (All objective assessments  below are from initial evaluation on: 02/18/22 unless otherwise specified.)   HAND DOMINANCE: Right   ADLs: Overall ADLs: States decreased ability to grab, hold household objects, pain and problems opening containers, perform FMS tasks (manipulate fasteners on clothing)  FUNCTIONAL OUTCOME MEASURES: 04/01/22: Quick DASH:2% now   Eval: Quck DASH 18% impairment today  (Higher % Score  =  More Impairment)    UPPER EXTREMITY ROM     Shoulder to Wrist AROM Right eval Left eval Lt 03/02/22 Lt 03/11/22 Lt 03/16/22 Lt 04/01/22  Forearm supination  85      Forearm pronation   85      Wrist flexion 59 49 42 57 62 54  Wrist extension 70 57 60 62 69 66  (Blank rows = not tested)   Hand AROM Left eval Lt 03/02/22 Lt  03/11/22 Lt 03/16/22 Lt  04/01/22  Full Fist Ability (or Gap to Distal Palmar Crease) Yes, loose fist      Thumb Opposition  (Kapandji Scale)  10      Long MCP (0-90) (-41) -  83 (-35) - 72 (-30) - 83 (-35) - 83 (-25) - 80  Long PIP (0-100) 0-100   0-104 0- 105 0 - 110  Long DIP (0-70) 0-70   0-75 0-65 0 - 84  Ring MCP (0-90) (-21) -  82 (-27) - 81 (-29) - 83 (-24) - 80 (-23) - 78  Ring PIP (0-100)  0-95  0-99 0-100 0 - 100  Ring DIP (0-70) 0-70   0-70 0-70 0 - 80  (Blank rows = not tested)   UPPER EXTREMITY MMT:     MMT Left 03/16/22 Lt 04/01/22  Wrist flexion 4+/5 5/5  Wrist extension 4/5 5/5  (Blank rows = not tested)  HAND FUNCTION: 04/01/22: Lt grip: 84#  03/02/22: Grip strength Right: 71 lbs, Left: 70 lbs   Eval: Observed weakness in affected Lt hand, nt due to recent surgeries.    COORDINATION: 04/01/22: BBT 60 today,  Eval: Observed coordination impairments with affected Lt hand.Box and Blocks Test: Left 54 Blocks today (70 is AVG).   EDEMA:   Eval:  Mildly swollen in hand and dorsum of wrist today   OBSERVATIONS:   03/16/22:  His scar is still obviously adhered to the structures below, though somewhat better, remains an issue.  Wound completely healed. Overt  intrinsic tightness in hand but mainly a lag in extension for digits 3, 4.   TODAY'S TREATMENT:  04/01/22: He performs new active range of motion showing continued improvement in the hand with finger extension at the MCP joints.  His grip and finger flexion continues to be better as well.  His strength is better and his coordination is also better.  He also reviewed goals and functional mobility which is all much better.  Lastly  we discussed possibility of performing dry needling to help further enhance mobility of the adherent scar-he is in agreement to try this if physicians think it safe and appropriate.  If not OT does finalize home exercise program with him and he states understanding how to go forward.    PATIENT EDUCATION: Education details: See tx section above for details  Person educated: Patient Education method: Verbal Instruction, Teach back, Handouts  Education comprehension: States and demonstrates understanding, Additional Education required    HOME EXERCISE PROGRAM: Access Code: [email or web address] URL: [email or web address]  GOALS: Goals reviewed with patient? Yes   SHORT TERM GOALS: (STG required if POC>30 days) Target Date: 03/12/22  Pt will obtain protective, custom orthotic. Goal status: MET  2.  Pt will demo/state understanding of initial HEP to improve pain levels and prerequisite motion. Goal status: 04/01/22: MET   LONG TERM GOALS: Target Date: 04/02/22   Pt will improve functional ability by decreased impairment per Quick DASH assessment from 18% to 7% or better, for better quality of life. Goal status: 04/01/22: MET  2.  Pt will improve grip strength in Lt hand to at least 50lbs for functional use at home and in IADLs. Goal status: 04/01/22: MET  3.  Pt will improve A/ROM in Lt MF and RF TAM from 212* and 226*, respectively to at least 230* each, to have functional motion for tasks like reach and grasp.  Goal status:  04/01/22: MET in BOTH  4.  Pt will  improve strength in Lt wrist ext from 3-/5 painful MMT to at least 4+/5 MMT to have increased functional ability to carry out selfcare and higher-level homecare tasks with no difficulty. Goal status:  04/01/22: MET  5.  Pt will improve coordination skills in Lt hand, as seen by better score on BBT testing to at least 65 Blocks have increased functional ability to carry out fine motor tasks (fasteners, etc.) and more complex, coordinated IADLs (meal prep, sports, etc.).  Goal status: 04/01/22: Partially Met- up to 60 blocks now     ASSESSMENT:  CLINICAL IMPRESSION: 04/01/22: He has now met most of his goals and has been able to do leisure activities of playing golf.  He still complains of a limited extension in the middle and ring fingers though this is improved significantly.  He has a great home exercise program and orthotics that are working well for him, but we could also try dry needling the scar area to see if we could improve the scar adhesion.  This is dependent on clearance from the physicians involved in his care (surgeon and wound care specialist).  He is in agreement to try this modality to improve his scar adhesions, if both doctors are in agreement that this is safe and possibly beneficial for him.  OT will request 1-2 additional visits to perform this modality if they agree and if not he will discharge as of today.    PLAN:  OT FREQUENCY & DURATION: Additional 2-3 visits over the next 2 to 3 weeks to perform dry needling modality to the adherent scar if physicians are in agreement. (Through 04/23/22 as needed)     PLANNED INTERVENTIONS: self care/ADL training, therapeutic exercise, therapeutic activity, neuromuscular re-education, manual therapy, scar mobilization, passive range of motion, splinting, electrical stimulation, ultrasound, fluidotherapy, compression bandaging, moist heat, cryotherapy, contrast bath, patient/family education, and coping strategies training  CONSULTED AND  AGREED WITH PLAN OF CARE: Patient  PLAN FOR NEXT SESSION:  See back for 1-2 visits of dry  needling for the adherent scar, if both physicians are in agreement that this is safe and beneficial for him.  If there are concerns about infection or recurrent infection then we will discharge therapy now having maximized the safe benefit from occupational therapy.  Benito Mccreedy, OTR/L, CHT 04/01/2022, 6:36 PM   OCCUPATIONAL THERAPY DISCHARGE SUMMARY  Visits from Start of Care: 5  As dry needling was not deemed appropriate by MD, we will finish therapy now, as described in note above.    Benito Mccreedy, OTR/L, CHT 04/27/22

## 2022-03-30 ENCOUNTER — Encounter: Payer: Self-pay | Admitting: Internal Medicine

## 2022-03-30 ENCOUNTER — Other Ambulatory Visit: Payer: Self-pay

## 2022-03-30 ENCOUNTER — Ambulatory Visit (INDEPENDENT_AMBULATORY_CARE_PROVIDER_SITE_OTHER): Payer: No Typology Code available for payment source | Admitting: Internal Medicine

## 2022-03-30 VITALS — BP 129/79 | HR 66 | Temp 97.9°F | Ht 69.0 in | Wt 165.0 lb

## 2022-03-30 DIAGNOSIS — T8149XA Infection following a procedure, other surgical site, initial encounter: Secondary | ICD-10-CM

## 2022-03-30 NOTE — Patient Instructions (Signed)
You are doing well  Let's get crp today and reexamine in 4-6 weeks

## 2022-03-30 NOTE — Progress Notes (Signed)
[facility]  Patient Active Problem List   Diagnosis Date Noted   Seropositive rheumatoid arthritis ([place]) 02/12/2022   High risk medication use 02/12/2022   Abscess of dorsum of left hand 01/26/2022   Extensor tenosynovitis of left wrist 12/01/2021   Primary osteoarthritis of right wrist 04/26/2017   Radiculopathy of cervical spine 04/26/2017   Total knee replacement status 01/27/2017   Primary osteoarthritis of right shoulder 12/30/2016   Primary osteoarthritis of right knee 12/30/2016      Subjective:    Patient ID: Heide Scales, male    DOB: [birth year], 62 y.o.   MRN: [id number]  Chief Complaint  Patient presents with   Follow-up    HPI:  CAISEN MANGAS is a 62 y.o. male here for f/u hospital I&D for his left wrist tenosynovitis/surgical site infection  He is currently taking zyvox. He was given 2 weeks of it The wound culture grew mrsa (S doxy)  He was on doxy prior to the surgery  No f/c/n/v/diarrhea The pain is getting a lot better  He'll see dr Erlinda Hong next week. Sutures not yet removed. He is also putting mupirocin on the wound   03/30/22 id clinic f/u Doing very well back to golf Can't fully extend fingers 3rd/4th yet    Allergies  Allergen Reactions   Meperidine     Unknown reaction       Outpatient Medications Prior to Visit  Medication Sig Dispense Refill   acetaminophen (TYLENOL) 325 MG tablet Take 650 mg by mouth every 6 (six) hours as needed for moderate pain.     diclofenac (VOLTAREN) 75 MG EC tablet Take 75 mg by mouth daily.     HYDROcodone-acetaminophen (NORCO) 5-325 MG tablet Take 1 tablet by mouth 3 (three) times daily as needed. To be taken after surgery 15 tablet 0   Multiple Vitamins-Minerals (MULTIVITAMIN PO) Take 1 tablet daily by mouth.     mupirocin ointment (BACTROBAN) 2 % Apply 1 Application topically daily. 22 g 0   Omega-3 Fatty Acids (FISH OIL PO) Take 900 mg by mouth daily.      omeprazole (PRILOSEC OTC) 20 MG tablet Take 20 mg daily by mouth.     ondansetron (ZOFRAN) 4 MG tablet Take 1 tablet (4 mg total) by mouth every 8 (eight) hours as needed for nausea or vomiting. 40 tablet 0   pravastatin (PRAVACHOL) 40 MG tablet Take 40 mg daily by mouth.  4   valACYclovir (VALTREX) 500 MG tablet Take 500 mg daily by mouth.  2   No facility-administered medications prior to visit.     Social History   Socioeconomic History   Marital status: Married    Spouse name: Not on file   Number of children: Not on file   Years of education: Not on file   Highest education level: Not on file  Occupational History   Not on file  Tobacco Use   Smoking status: Former    Types: Cigarettes    Quit date: 04/21/2010    Years since quitting: 11.9    Passive exposure: Never   Smokeless tobacco: Never  Vaping Use   Vaping Use: Never used  Substance and Sexual Activity   Alcohol use: No   Drug use: No   Sexual activity: Not on file  Other Topics Concern   Not on file  Social History Narrative   Not on file   Social Determinants of Health  Financial Resource Strain: Not on file  Food Insecurity: No Food Insecurity (01/28/2022)   Hunger Vital Sign    Worried About Running Out of Food in the Last Year: Never true    Ran Out of Food in the Last Year: Never true  Transportation Needs: No Transportation Needs (01/28/2022)   PRAPARE - Hydrologist (Medical): No    Lack of Transportation (Non-Medical): No  Physical Activity: Not on file  Stress: Not on file  Social Connections: Not on file  Intimate Partner Violence: Not At Risk (01/28/2022)   Humiliation, Afraid, Rape, and Kick questionnaire    Fear of Current or Ex-Partner: No    Emotionally Abused: No    Physically Abused: No    Sexually Abused: No      Review of Systems    All other ros negative   Objective:    BP 129/79   Pulse 66   Temp 97.9 F (36.6 C) (Temporal)   Ht 5\' 9"   (1.753 m)   Wt 165 lb (74.8 kg)   SpO2 94%   BMI 24.37 kg/m  Nursing note and vital signs reviewed.  Physical Exam     General/constitutional: no distress, pleasant HEENT: Normocephalic, PER, Conj Clear, EOMI, Oropharynx clear Neck supple CV: rrr no mrg Lungs: clear to auscultation, normal respiratory effort Abd: Soft, Nontender Ext: no edema Skin: No Rash Neuro: nonfocal MSK: left hand incision all healed no tenderness/fluctuance    Labs:  Micro:  Serology:  Imaging:  Assessment & Plan:   Problem List Items Addressed This Visit   None Visit Diagnoses     Surgical site infection    -  Primary   Relevant Orders   C-reactive protein        No orders of the defined types were placed in this encounter.    Abx: 12/15- 4 weeks doxycycline 12/01-12/15 linezolid 11/29-12/01 amp/sulbactam   10/30-11/29 3 days cephalexin followed by doxycycline     Assessment: 61 yo male hx left hip replacement 2012, right total knee replacement 2018, rheumatoid arthritis not on dmards/pred or any specific RA treatment, bilateral carpal tunnel syndrome, who has had left wrist extensor tendon repair/tenolysis for concern of impending tendon rupture, complicated by early surgical site infection    No sepsis outside of localized wrist soft tissue infection   12/01 S/p repeat left hand abscess I&D 11/29 s/p I&D left hand abscess/extensor tendons -- cx staph aureus pending susceptibility   I discussed with Dr Erlinda Hong. No more surgery is planned. He had to debride down to the metacarpal level. There was no wrist joint involvement. While there was sign of gross metacarpal involvement, given the significant inflammatory change of RA, organism being staph aureus, and depth of infection, will plan to treat for 6 weeks    ------- 02/09/22 assessment Incision healling well no obvious dehiscence; no contact dermatitis visualized with the mupirocin Finish 4 more days of zyvox for the initial  2 weeks then transition to 4 more weeks of doxycycline  I suspect necrotic tissue was so extensive and needed debridement for abx to work Labs today F/u in 4 weeks with me 4 weeks rx doxy given F/u ortho  Avoid mupirocin  03/30/22 assessment Patient finished 6 weeks abx ending with doxycycline mid jan 2024 Patient thinks he is almost a 100% (still has a little 3rd/4th finger extension but playing golf  Will check crp today  Revisit in around 4-6 weeks and if wound continues to  look and feel good will not need any further testing      Follow-up: No follow-ups on file.      Jabier Mutton, [facility] 03/30/2022, 11:40 AM

## 2022-03-31 LAB — C-REACTIVE PROTEIN: CRP: 2.3 mg/L (ref <8.0)

## 2022-04-01 ENCOUNTER — Encounter: Payer: Self-pay | Admitting: Rehabilitative and Restorative Service Providers"

## 2022-04-01 ENCOUNTER — Ambulatory Visit: Payer: No Typology Code available for payment source | Admitting: Rehabilitative and Restorative Service Providers"

## 2022-04-01 DIAGNOSIS — R600 Localized edema: Secondary | ICD-10-CM

## 2022-04-01 DIAGNOSIS — M25642 Stiffness of left hand, not elsewhere classified: Secondary | ICD-10-CM | POA: Diagnosis not present

## 2022-04-01 DIAGNOSIS — M79642 Pain in left hand: Secondary | ICD-10-CM

## 2022-04-01 DIAGNOSIS — R278 Other lack of coordination: Secondary | ICD-10-CM | POA: Diagnosis not present

## 2022-04-01 DIAGNOSIS — M6281 Muscle weakness (generalized): Secondary | ICD-10-CM | POA: Diagnosis not present

## 2022-04-01 DIAGNOSIS — M25532 Pain in left wrist: Secondary | ICD-10-CM

## 2022-04-02 NOTE — Progress Notes (Signed)
Hi Ovid Curd, i havent revealed the risk of infection on dry needling  He is at higher risk due to connective tissue disease and prior surgeries  I would defer to dr Erlinda Hong of ortho   From my standpoint if the risk benefit justifies dry needling the patient could decide. Dr Phoebe Sharps input might help him make a more informed consent  Thanks

## 2022-04-05 NOTE — Therapy (Incomplete)
OUTPATIENT OCCUPATIONAL THERAPY TREATMENT NOTE  Patient Name: Taylor Heath MRN: [id number] DOB:December 16, 1960, 62 y.o., male Today's Date: 04/05/2022  PCP: Redmond School, MD  REFERRING PROVIDER:  Leandrew Koyanagi, MD    END OF SESSION:    Past Medical History:  Diagnosis Date   Anxiety    Arthritis    GERD (gastroesophageal reflux disease)    History of kidney stones    Pneumonia    Past Surgical History:  Procedure Laterality Date   FINGER SURGERY Left    index   I & D EXTREMITY Left 01/27/2022   Procedure: IRRIGATION AND DEBRIDEMENT LEFT HAND;  Surgeon: Leandrew Koyanagi, MD;  Location: [place];  Service: Orthopedics;  Laterality: Left;   I & D EXTREMITY Left 01/29/2022   Procedure: REPEAT IRRIGATION AND DEBRIDEMENT OF HAND;  Surgeon: Leandrew Koyanagi, MD;  Location: [place];  Service: Orthopedics;  Laterality: Left;   JOINT REPLACEMENT     left hip replaced   KNEE ARTHROSCOPY W/ ACL RECONSTRUCTION Right 2009   LEG SURGERY Left    left calf   MINOR APPLICATION OF WOUND VAC  01/29/2022   Procedure: APPLICATION OF WOUND VAC;  Surgeon: Leandrew Koyanagi, MD;  Location: [place];  Service: Orthopedics;;   ORIF FINGER / THUMB FRACTURE     TOTAL KNEE ARTHROPLASTY Right 01/27/2017   Procedure: RIGHT TOTAL KNEE ARTHROPLASTY;  Surgeon: Leandrew Koyanagi, MD;  Location: [place];  Service: Orthopedics;  Laterality: Right;   Patient Active Problem List   Diagnosis Date Noted   Seropositive rheumatoid arthritis ([place]) 02/12/2022   High risk medication use 02/12/2022   Abscess of dorsum of left hand 01/26/2022   Extensor tenosynovitis of left wrist 12/01/2021   Primary osteoarthritis of right wrist 04/26/2017   Radiculopathy of cervical spine 04/26/2017   Total knee replacement status 01/27/2017   Primary osteoarthritis of right shoulder 12/30/2016   Primary osteoarthritis of right knee 12/30/2016    ONSET DATE: DOS 01/29/22 (I&D Lt hand) (Ext tendon repairs 12/10/21)  REFERRING DIAG:  L02.512  (ICD-10-CM) - Abscess of dorsum of left hand  M65.832 (ICD-10-CM) - Extensor tenosynovitis of left wrist    THERAPY DIAG:  No diagnosis found.  Rationale for Evaluation and Treatment: Rehabilitation  PERTINENT HISTORY: 15+ weeks post tenosynovectomy and ext tendon repairs, 6+ weeks post most recent I& D sx.  Per MD: "Eval and treat s/p left wrist tenosynovectomy. Strengthening of long and ring finger extension...OT for hand rehab hopefully he can strengthen his long and ring finger. His ring finger extension strength has significantly improved. I would like to recheck him in about 3 weeks. He does have a follow-up with Dr. Gale Journey in about 3 weeks. "  He had left wrist extensor tendon repair/tenolysis for concern of impending tendon rupture, complicated by early surgical site infection.  PRECAUTIONS: None; WEIGHT BEARING RESTRICTIONS: None now    SUBJECTIVE:   SUBJECTIVE STATEMENT: He states ***   playing 8 days of golf in a row and doing very well. Stiffness is improving but scar is still limiting    PAIN:  Are you having pain? ***  None at rest Rating: 0/10 at rest now, up to 0/10 at worst in past week   PATIENT GOALS: To be able to return to golfing and full use of his left nondominant hand.   OBJECTIVE: (All objective assessments below are from initial evaluation on: 02/18/22 unless otherwise specified.)   HAND DOMINANCE: Right   ADLs: Overall ADLs:  States decreased ability to grab, hold household objects, pain and problems opening containers, perform FMS tasks (manipulate fasteners on clothing)  FUNCTIONAL OUTCOME MEASURES: 04/01/22: Quick DASH:2% now   Eval: Quck DASH 18% impairment today  (Higher % Score  =  More Impairment)    UPPER EXTREMITY ROM     Shoulder to Wrist AROM Right eval Left eval Lt 03/02/22 Lt 03/11/22 Lt 03/16/22 Lt 04/01/22  Forearm supination  85      Forearm pronation   85      Wrist flexion 59 49 42 57 62 54  Wrist extension 70 57 60 62 69 66   (Blank rows = not tested)   Hand AROM Left eval Lt 03/02/22 Lt  03/11/22 Lt 03/16/22 Lt  04/01/22  Full Fist Ability (or Gap to Distal Palmar Crease) Yes, loose fist      Thumb Opposition  (Kapandji Scale)  10      Long MCP (0-90) (-41) -  83 (-35) - 72 (-30) - 83 (-35) - 83 (-25) - 80  Long PIP (0-100) 0-100   0-104 0- 105 0 - 110  Long DIP (0-70) 0-70   0-75 0-65 0 - 84  Ring MCP (0-90) (-21) -  82 (-27) - 81 (-29) - 83 (-24) - 80 (-23) - 78  Ring PIP (0-100)  0-95  0-99 0-100 0 - 100  Ring DIP (0-70) 0-70   0-70 0-70 0 - 80  (Blank rows = not tested)   UPPER EXTREMITY MMT:     MMT Left 03/16/22 Lt 04/01/22  Wrist flexion 4+/5 5/5  Wrist extension 4/5 5/5  (Blank rows = not tested)  HAND FUNCTION: 04/01/22: Lt grip: 84#  03/02/22: Grip strength Right: 71 lbs, Left: 70 lbs   Eval: Observed weakness in affected Lt hand, nt due to recent surgeries.    COORDINATION: 04/01/22: BBT 60 today,  Eval: Observed coordination impairments with affected Lt hand.Box and Blocks Test: Left 54 Blocks today (70 is AVG).   EDEMA:   Eval:  Mildly swollen in hand and dorsum of wrist today   OBSERVATIONS:   03/16/22:  His scar is still obviously adhered to the structures below, though somewhat better, remains an issue.  Wound completely healed. Overt intrinsic tightness in hand but mainly a lag in extension for digits 3, 4.   TODAY'S TREATMENT:  04/06/22: ***See back for 1-2 visits of dry needling for the adherent scar, if both physicians are in agreement that this is safe and beneficial for him.  If there are concerns about infection or recurrent infection then we will discharge therapy now having maximized the safe benefit from occupational therapy.   04/01/22: He performs new active range of motion showing continued improvement in the hand with finger extension at the MCP joints.  His grip and finger flexion continues to be better as well.  His strength is better and his coordination is also  better.  He also reviewed goals and functional mobility which is all much better.  Lastly we discussed possibility of performing dry needling to help further enhance mobility of the adherent scar-he is in agreement to try this if physicians think it safe and appropriate.  If not OT does finalize home exercise program with him and he states understanding how to go forward.    PATIENT EDUCATION: Education details: See tx section above for details  Person educated: Patient Education method: Verbal Instruction, Teach back, Handouts  Education comprehension: States and demonstrates understanding, Additional Education  required    HOME EXERCISE PROGRAM: Access Code: [email or web address] URL: [email or web address]  GOALS: Goals reviewed with patient? Yes   SHORT TERM GOALS: (STG required if POC>30 days) Target Date: 03/12/22  Pt will obtain protective, custom orthotic. Goal status: MET  2.  Pt will demo/state understanding of initial HEP to improve pain levels and prerequisite motion. Goal status: 04/01/22: MET   LONG TERM GOALS: Target Date: 04/02/22   Pt will improve functional ability by decreased impairment per Quick DASH assessment from 18% to 7% or better, for better quality of life. Goal status: 04/01/22: MET  2.  Pt will improve grip strength in Lt hand to at least 50lbs for functional use at home and in IADLs. Goal status: 04/01/22: MET  3.  Pt will improve A/ROM in Lt MF and RF TAM from 212* and 226*, respectively to at least 230* each, to have functional motion for tasks like reach and grasp.  Goal status:  04/01/22: MET in BOTH  4.  Pt will improve strength in Lt wrist ext from 3-/5 painful MMT to at least 4+/5 MMT to have increased functional ability to carry out selfcare and higher-level homecare tasks with no difficulty. Goal status:  04/01/22: MET  5.  Pt will improve coordination skills in Lt hand, as seen by better score on BBT testing to at least 65 Blocks have increased  functional ability to carry out fine motor tasks (fasteners, etc.) and more complex, coordinated IADLs (meal prep, sports, etc.).  Goal status: 04/01/22: Partially Met- up to 60 blocks now     ASSESSMENT:  CLINICAL IMPRESSION: 04/06/22: ***  04/01/22: He has now met most of his goals and has been able to do leisure activities of playing golf.  He still complains of a limited extension in the middle and ring fingers though this is improved significantly.  He has a great home exercise program and orthotics that are working well for him, but we could also try dry needling the scar area to see if we could improve the scar adhesion.  This is dependent on clearance from the physicians involved in his care (surgeon and wound care specialist).  He is in agreement to try this modality to improve his scar adhesions, if both doctors are in agreement that this is safe and possibly beneficial for him.  OT will request 1-2 additional visits to perform this modality if they agree and if not he will discharge as of today.    PLAN:  OT FREQUENCY & DURATION: Additional 2-3 visits over the next 2 to 3 weeks to perform dry needling modality to the adherent scar if physicians are in agreement. (Through 04/23/22 as needed)     PLANNED INTERVENTIONS: self care/ADL training, therapeutic exercise, therapeutic activity, neuromuscular re-education, manual therapy, scar mobilization, passive range of motion, splinting, electrical stimulation, ultrasound, fluidotherapy, compression bandaging, moist heat, cryotherapy, contrast bath, patient/family education, and coping strategies training  CONSULTED AND AGREED WITH PLAN OF CARE: Patient  PLAN FOR NEXT SESSION:  ***  Benito Mccreedy, OTR/L, CHT 04/05/2022, 6:28 PM

## 2022-04-06 ENCOUNTER — Encounter: Payer: Managed Care, Other (non HMO) | Admitting: Rehabilitative and Restorative Service Providers"

## 2022-04-07 DIAGNOSIS — M6208 Separation of muscle (nontraumatic), other site: Secondary | ICD-10-CM | POA: Insufficient documentation

## 2022-04-07 DIAGNOSIS — K429 Umbilical hernia without obstruction or gangrene: Secondary | ICD-10-CM | POA: Insufficient documentation

## 2022-04-07 NOTE — Progress Notes (Unsigned)
   Post-Op Visit Note   Patient: Taylor Heath           Date of Birth: 1960/04/10           MRN: [id number] Visit Date: 04/08/2022 PCP: Redmond School, MD   Assessment & Plan:  Chief Complaint: No chief complaint on file.  Visit Diagnoses:  1. Abscess of dorsum of left hand     Plan: ***  Follow-Up Instructions: No follow-ups on file.   Orders:  No orders of the defined types were placed in this encounter.  No orders of the defined types were placed in this encounter.   Imaging: No results found.  PMFS History: Patient Active Problem List   Diagnosis Date Noted   Seropositive rheumatoid arthritis ([place]) 02/12/2022   High risk medication use 02/12/2022   Abscess of dorsum of left hand 01/26/2022   Extensor tenosynovitis of left wrist 12/01/2021   Primary osteoarthritis of right wrist 04/26/2017   Radiculopathy of cervical spine 04/26/2017   Total knee replacement status 01/27/2017   Primary osteoarthritis of right shoulder 12/30/2016   Primary osteoarthritis of right knee 12/30/2016   Past Medical History:  Diagnosis Date   Anxiety    Arthritis    GERD (gastroesophageal reflux disease)    History of kidney stones    Pneumonia     Family History  Problem Relation Age of Onset   Rheum arthritis Father     Past Surgical History:  Procedure Laterality Date   FINGER SURGERY Left    index   I & D EXTREMITY Left 01/27/2022   Procedure: IRRIGATION AND DEBRIDEMENT LEFT HAND;  Surgeon: Leandrew Koyanagi, MD;  Location: [place];  Service: Orthopedics;  Laterality: Left;   I & D EXTREMITY Left 01/29/2022   Procedure: REPEAT IRRIGATION AND DEBRIDEMENT OF HAND;  Surgeon: Leandrew Koyanagi, MD;  Location: [place];  Service: Orthopedics;  Laterality: Left;   JOINT REPLACEMENT     left hip replaced   KNEE ARTHROSCOPY W/ ACL RECONSTRUCTION Right 2009   LEG SURGERY Left    left calf   MINOR APPLICATION OF WOUND VAC  01/29/2022   Procedure: APPLICATION OF WOUND VAC;  Surgeon:  Leandrew Koyanagi, MD;  Location: [place];  Service: Orthopedics;;   ORIF FINGER / THUMB FRACTURE     TOTAL KNEE ARTHROPLASTY Right 01/27/2017   Procedure: RIGHT TOTAL KNEE ARTHROPLASTY;  Surgeon: Leandrew Koyanagi, MD;  Location: [place];  Service: Orthopedics;  Laterality: Right;   Social History   Occupational History   Not on file  Tobacco Use   Smoking status: Former    Types: Cigarettes    Quit date: 04/21/2010    Years since quitting: 11.9    Passive exposure: Never   Smokeless tobacco: Never  Vaping Use   Vaping Use: Never used  Substance and Sexual Activity   Alcohol use: No   Drug use: No   Sexual activity: Not on file

## 2022-04-08 ENCOUNTER — Telehealth: Payer: Self-pay | Admitting: Orthopaedic Surgery

## 2022-04-08 ENCOUNTER — Ambulatory Visit: Payer: Managed Care, Other (non HMO) | Admitting: Orthopaedic Surgery

## 2022-04-08 DIAGNOSIS — L02512 Cutaneous abscess of left hand: Secondary | ICD-10-CM

## 2022-04-08 NOTE — Telephone Encounter (Signed)
[employer] disability forms received. To [employer].

## 2022-04-12 NOTE — Progress Notes (Signed)
Office Visit Note  Patient: Taylor Heath             Date of Birth: [birth year]           MRN: [id number]             PCP: Redmond School, MD Referring: Redmond School, MD Visit Date: 04/13/2022   Subjective:  Follow-up (Doing pretty good. )   History of Present Illness: Taylor Heath is a 62 y.o. male here for follow up for rheumatoid arthritis with complication of left wrist extensor tendon rupture and prolonged recovery from surgical repair due to localized infection.  He finished his oral doxycycline completely over the interval with no additional infectious disease follow-up scheduled.  He is working with occupational therapy on his extension he is almost able to achieve a full movement on the third and fourth finger.  No additional swelling and surgical scar is well-healed.  He has noticed some more pain in his right wrist that he attributes to relying more heavily on the 1 hand due to the prolonged immobilization and recovery on his left side.  He also has carpal tunnel syndrome which may be contributing.  He is scheduled for umbilical hernia repair surgery on the 20th.  Previous HPI 02/12/22 Taylor Heath is a 62 y.o. male here for seropositive RA. He previously saw Dr. Abner Greenspan in 2016 without active disease noted at that time persistent positive CCP Abs.  He was never started on any long-term DMARD.  He has had some degree of chronic joint pain at the hands mostly related to first [facility] joint osteoarthritis.  Otherwise also with some chronic osteoarthritis symptoms affecting the knees and hips.  Recently with complications of tenosynovitis and hand deformity complicated by extensor tendon damage progressing towards possible rupture. Surgical repair with tenolysis was complicated recently with surgical site infection with staph aureus requiring debridement and subsequent prolonged antibiotic on linezolid then doxycycline planned for next month.  Currently symptoms are pretty much at  baseline excepting the left wrist which has some stiffness and decreased range of motion with the ongoing healing dorsal surgical wound.  He has a family history his father had severe deforming rheumatoid arthritis.   Labs reviewed 10/2014 RF 106 CCP 34   Imaging reviewed MRI Left Wrist 11/26/21 IMPRESSION: 1. Moderate to high-grade fourth and fifth, moderate second and third, and mild to moderate 6th dorsal extensor compartment tenosynovitis. There is scattered debris and some thin internal septations within the tendon sheath fluid indicating mild complexity. 2. Multiple interstitial tears within the fourth and fifth extensor compartment tendons. Moderate extensor carpi ulnaris tendinosis with small interstitial tears. 3. Possible partial-thickness tear of the distal peripheral triangular fibrocartilage complex. 4. Possible partial-thickness tear of the proximal aspect of the lunotriquetral ligament. 5. Moderate to severe triscaphe, moderate thumb carpometacarpal, and moderate lunotriquetral cartilage degenerative changes. Scattered carpal subchondral cysts.   Bilateral hand xrays 03/2020 multiple carpal cysts, mild to moderate 1st [facility] joint osteoarthritis   Review of Systems  Constitutional: Negative.  Negative for fatigue.  HENT: Negative.  Negative for mouth sores and mouth dryness.   Eyes:  Positive for dryness.  Respiratory: Negative.  Negative for shortness of breath.   Cardiovascular: Negative.  Negative for chest pain and palpitations.  Gastrointestinal: Negative.  Negative for blood in stool, constipation and diarrhea.  Endocrine: Negative.  Negative for increased urination.  Genitourinary: Negative.  Negative for involuntary urination.  Musculoskeletal:  Positive for joint pain, joint pain, joint  swelling and morning stiffness. Negative for gait problem, myalgias, muscle weakness, muscle tenderness and myalgias.  Skin: Negative.  Negative for pallor, rash, hair loss and  sensitivity to sunlight.  Allergic/Immunologic: Negative.  Negative for susceptible to infections.  Neurological: Negative.  Negative for dizziness and headaches.  Hematological: Negative.  Negative for swollen glands.  Psychiatric/Behavioral: Negative.  Negative for depressed mood and sleep disturbance. The patient is not nervous/anxious.     PMFS History:  Patient Active Problem List   Diagnosis Date Noted   Umbilical hernia without obstruction and without gangrene 04/07/2022   Seropositive rheumatoid arthritis ([place]) 02/12/2022   High risk medication use 02/12/2022   Abscess of dorsum of left hand 01/26/2022   Extensor tenosynovitis of left wrist 12/01/2021   Primary osteoarthritis of right wrist 04/26/2017   Radiculopathy of cervical spine 04/26/2017   Total knee replacement status 01/27/2017   Primary osteoarthritis of right shoulder 12/30/2016   Primary osteoarthritis of right knee 12/30/2016    Past Medical History:  Diagnosis Date   Anxiety    Arthritis    GERD (gastroesophageal reflux disease)    History of kidney stones    Pneumonia     Family History  Problem Relation Age of Onset   Rheum arthritis Father    Past Surgical History:  Procedure Laterality Date   FINGER SURGERY Left    index   I & D EXTREMITY Left 01/27/2022   Procedure: IRRIGATION AND DEBRIDEMENT LEFT HAND;  Surgeon: Leandrew Koyanagi, MD;  Location: [place];  Service: Orthopedics;  Laterality: Left;   I & D EXTREMITY Left 01/29/2022   Procedure: REPEAT IRRIGATION AND DEBRIDEMENT OF HAND;  Surgeon: Leandrew Koyanagi, MD;  Location: [place];  Service: Orthopedics;  Laterality: Left;   JOINT REPLACEMENT     left hip replaced   KNEE ARTHROSCOPY W/ ACL RECONSTRUCTION Right 2009   LEG SURGERY Left    left calf   MINOR APPLICATION OF WOUND VAC  01/29/2022   Procedure: APPLICATION OF WOUND VAC;  Surgeon: Leandrew Koyanagi, MD;  Location: [place];  Service: Orthopedics;;   ORIF FINGER / THUMB FRACTURE     TOTAL KNEE  ARTHROPLASTY Right 01/27/2017   Procedure: RIGHT TOTAL KNEE ARTHROPLASTY;  Surgeon: Leandrew Koyanagi, MD;  Location: [place];  Service: Orthopedics;  Laterality: Right;   Social History   Social History Narrative   Not on file    There is no immunization history on file for this patient.   Objective: Vital Signs: BP 133/88 (BP Location: Left Arm, Patient Position: Sitting, Cuff Size: Normal)   Pulse 77   Resp 18   Ht '5\' 9"'$  (1.753 m)   Wt 170 lb (77.1 kg)   BMI 25.10 kg/m    Physical Exam Cardiovascular:     Rate and Rhythm: Normal rate and regular rhythm.  Pulmonary:     Effort: Pulmonary effort is normal.     Breath sounds: Normal breath sounds.  Skin:    General: Skin is warm and dry.     Findings: No rash.  Neurological:     Mental Status: He is alert.  Psychiatric:        Mood and Affect: Mood normal.      Musculoskeletal Exam:  Shoulders full ROM no tenderness or swelling Elbows full ROM no tenderness or swelling Left wrist healed surgical scar, slightly limited range of motion no palpable swelling No palpable synovitis in right wrist and with full range of motion  intact, negative Tinel's sign Left third and fourth finger full flexion range of motion, extension slightly limited Knees full ROM no tenderness or swelling Ankles full ROM no tenderness or swelling   Investigation: No additional findings.  Imaging: No results found.  Recent Labs: Lab Results  Component Value Date   WBC 6.0 02/09/2022   HGB 15.0 02/09/2022   PLT 316 02/09/2022   NA 141 02/09/2022   K 4.5 02/09/2022   CL 105 02/09/2022   CO2 26 02/09/2022   GLUCOSE 95 02/09/2022   BUN 19 02/09/2022   CREATININE 0.97 02/09/2022   BILITOT 0.4 02/09/2022   ALKPHOS 65 01/19/2017   AST 22 02/09/2022   ALT 32 02/09/2022   PROT 7.1 02/09/2022   ALBUMIN 4.5 01/19/2017   CALCIUM 9.6 02/09/2022   GFRAA >60 01/28/2017   QFTBGOLDPLUS NEGATIVE 02/12/2022    Speciality Comments: No specialty  comments available.  Procedures:  No procedures performed Allergies: Meperidine   Assessment / Plan:     Visit Diagnoses: Seropositive rheumatoid arthritis ([place]) - Plan: methotrexate (RHEUMATREX) 2.5 MG tablet  Not much peripheral synovitis active on exam today but with the left wrist complications that look consistent with chronic tenosynovitis inflammation leading to tendon rupture I do think he would benefit with getting onto maintenance treatment.  Plan to start methotrexate 15 mg p.o. weekly and folic acid 1 mg daily.  Recommend waiting to start medications until a week after his scheduled hernia surgery to minimize any extra perioperative risk.  Extensor tenosynovitis of left wrist Abscess of dorsum of left hand  He is doing pretty well with wound healing and resolution of the infection.  Extensor range of motion in his third and fourth finger are likely to remain partially impaired indefinitely.  No new synovitis appreciated on exam.  High risk medication use  Reviewed risks of methotrexate including cytopenias hepatotoxicity mucositis or pneumonitis or GI intolerance.  He had previous labs checked with normal baseline liver function and negative for hepatitis.  Umbilical hernia without obstruction and without gangrene  No strangulation or severe symptoms, scheduled for surgery on the 20th.  Orders: No orders of the defined types were placed in this encounter.  Meds ordered this encounter  Medications   methotrexate (RHEUMATREX) 2.5 MG tablet    Sig: Take 6 tablets (15 mg total) by mouth once a week. Caution:Chemotherapy. Protect from light.    Dispense:  30 tablet    Refill:  0   folic acid (FOLVITE) 1 MG tablet    Sig: Take 1 tablet (1 mg total) by mouth daily.    Dispense:  90 tablet    Refill:  0     Follow-Up Instructions: Return in about 7 weeks (around 06/01/2022) for RA MTX start f/u ~39mo   CCollier Salina MD  Note - This record has been created using  [employer]  Chart creation errors have been sought, but may not always  have been located. Such creation errors do not reflect on  the standard of medical care.

## 2022-04-13 ENCOUNTER — Ambulatory Visit: Payer: No Typology Code available for payment source | Attending: Internal Medicine | Admitting: Internal Medicine

## 2022-04-13 ENCOUNTER — Encounter: Payer: Self-pay | Admitting: Internal Medicine

## 2022-04-13 VITALS — BP 133/88 | HR 77 | Resp 18 | Ht 69.0 in | Wt 170.0 lb

## 2022-04-13 DIAGNOSIS — M059 Rheumatoid arthritis with rheumatoid factor, unspecified: Secondary | ICD-10-CM | POA: Diagnosis not present

## 2022-04-13 DIAGNOSIS — L02512 Cutaneous abscess of left hand: Secondary | ICD-10-CM | POA: Diagnosis not present

## 2022-04-13 DIAGNOSIS — Z79899 Other long term (current) drug therapy: Secondary | ICD-10-CM

## 2022-04-13 DIAGNOSIS — K429 Umbilical hernia without obstruction or gangrene: Secondary | ICD-10-CM

## 2022-04-13 DIAGNOSIS — M65832 Other synovitis and tenosynovitis, left forearm: Secondary | ICD-10-CM

## 2022-04-13 MED ORDER — METHOTREXATE SODIUM 2.5 MG PO TABS: 15.0000 mg | ORAL_TABLET | ORAL | 0 refills | Status: DC

## 2022-04-13 MED ORDER — FOLIC ACID 1 MG PO TABS: 1.0000 mg | ORAL_TABLET | Freq: Every day | ORAL | 0 refills | Status: DC

## 2022-04-13 NOTE — Patient Instructions (Signed)
Methotrexate Tablets What is this medication? METHOTREXATE (METH oh TREX ate) treats inflammatory conditions such as arthritis and psoriasis. It works by decreasing inflammation, which can reduce pain and prevent long-term injury to the joints and skin. It may also be used to treat some types of cancer. It works by slowing down the growth of cancer cells. This medicine may be used for other purposes; ask your health care provider or pharmacist if you have questions. COMMON BRAND NAME(S): Rheumatrex, Trexall What should I tell my care team before I take this medication? They need to know if you have any of these conditions: Fluid in the stomach area or lungs If you often drink alcohol Infection or immune system problems Kidney disease or on hemodialysis Liver disease Low blood counts, like low white cell, platelet, or red cell counts Lung disease Radiation therapy Stomach ulcers Ulcerative colitis An unusual or allergic reaction to methotrexate, other medications, foods, dyes, or preservatives Pregnant or trying to get pregnant Breast-feeding How should I use this medication? Take this medication by mouth with a glass of water. Follow the directions on the prescription label. Take your medication at regular intervals. Do not take it more often than directed. Do not stop taking except on your care team's advice. Make sure you know why you are taking this medication and how often you should take it. If this medication is used for a condition that is not cancer, like arthritis or psoriasis, it should be taken weekly, NOT daily. Taking this medication more often than directed can cause serious side effects, even death. Talk to your care team about safe handling and disposal of this medication. You may need to take special precautions. Talk to your care team about the use of this medication in children. While this medication may be prescribed for selected conditions, precautions do  apply. Overdosage: If you think you have taken too much of this medicine contact a poison control center or emergency room at once. NOTE: This medicine is only for you. Do not share this medicine with others. What if I miss a dose? If you miss a dose, talk with your care team. Do not take double or extra doses. What may interact with this medication? Do not take this medication with any of the following: Acitretin This medication may also interact with the following: Aspirin and aspirin-like medications including salicylates Azathioprine Certain antibiotics like penicillins, tetracycline, and chloramphenicol Certain medications that treat or prevent blood clots like warfarin, apixaban, dabigatran, and rivaroxaban Certain medications for stomach problems like esomeprazole, omeprazole, pantoprazole Cyclosporine Dapsone Diuretics Gold Hydroxychloroquine Live virus vaccines Medications for infection like acyclovir, adefovir, amphotericin B, bacitracin, cidofovir, foscarnet, ganciclovir, gentamicin, pentamidine, vancomycin Mercaptopurine NSAIDs, medications for pain and inflammation, like ibuprofen or naproxen Other cytotoxic agents Pamidronate Pemetrexed Penicillamine Phenylbutazone Phenytoin Probenecid Pyrimethamine Retinoids such as isotretinoin and tretinoin Steroid medications like prednisone or cortisone Sulfonamides like sulfasalazine and trimethoprim/sulfamethoxazole Theophylline Zoledronic acid This list may not describe all possible interactions. Give your health care provider a list of all the medicines, herbs, non-prescription drugs, or dietary supplements you use. Also tell them if you smoke, drink alcohol, or use illegal drugs. Some items may interact with your medicine. What should I watch for while using this medication? Avoid alcoholic drinks. This medication can make you more sensitive to the sun. Keep out of the sun. If you cannot avoid being in the sun, wear  protective clothing and use sunscreen. Do not use sun lamps or tanning beds/booths. You may need   blood work done while you are taking this medication. Call your care team for advice if you get a fever, chills or sore throat, or other symptoms of a cold or flu. Do not treat yourself. This medication decreases your body's ability to fight infections. Try to avoid being around people who are sick. This medication may increase your risk to bruise or bleed. Call your care team if you notice any unusual bleeding. Be careful brushing or flossing your teeth or using a toothpick because you may get an infection or bleed more easily. If you have any dental work done, tell your dentist you are receiving this medication. Check with your care team if you get an attack of severe diarrhea, nausea and vomiting, or if you sweat a lot. The loss of too much body fluid can make it dangerous for you to take this medication. Talk to your care team about your risk of cancer. You may be more at risk for certain types of cancers if you take this medication. Do not become pregnant while taking this medication or for 6 months after stopping it. Women should inform their care team if they wish to become pregnant or think they might be pregnant. Men should not father a child while taking this medication and for 3 months after stopping it. There is potential for serious harm to an unborn child. Talk to your care team for more information. Do not breast-feed an infant while taking this medication or for 1 week after stopping it. This medication may make it more difficult to get pregnant or father a child. Talk to your care team if you are concerned about your fertility. What side effects may I notice from receiving this medication? Side effects that you should report to your care team as soon as possible: Allergic reactions--skin rash, itching, hives, swelling of the face, lips, tongue, or throat Blood clot--pain, swelling, or warmth  in the leg, shortness of breath, chest pain Dry cough, shortness of breath or trouble breathing Infection--fever, chills, cough, sore throat, wounds that don't heal, pain or trouble when passing urine, general feeling of discomfort or being unwell Kidney injury--decrease in the amount of urine, swelling of the ankles, hands, or feet Liver injury--right upper belly pain, loss of appetite, nausea, light-colored stool, dark yellow or brown urine, yellowing of the skin or eyes, unusual weakness or fatigue Low red blood cell count--unusual weakness or fatigue, dizziness, headache, trouble breathing Redness, blistering, peeling, or loosening of the skin, including inside the mouth Seizures Unusual bruising or bleeding Side effects that usually do not require medical attention (report to your care team if they continue or are bothersome): Diarrhea Dizziness Hair loss Nausea Pain, redness, or swelling with sores inside the mouth or throat Vomiting This list may not describe all possible side effects. Call your doctor for medical advice about side effects. You may report side effects to FDA at 1-800-FDA-1088. Where should I keep my medication? Keep out of the reach of children and pets. Store at room temperature between 20 and 25 degrees C (68 and 77 degrees F). Protect from light. Get rid of any unused medication after the expiration date. Talk to your care team about how to dispose of unused medication. Special directions may apply. NOTE: This sheet is a summary. It may not cover all possible information. If you have questions about this medicine, talk to your doctor, pharmacist, or health care provider.  2023 Elsevier/Gold Standard (2020-04-21 00:00:00)

## 2022-04-22 ENCOUNTER — Ambulatory Visit: Payer: Managed Care, Other (non HMO) | Admitting: Internal Medicine

## 2022-04-30 HISTORY — PX: HERNIA REPAIR: SHX51

## 2022-05-04 ENCOUNTER — Other Ambulatory Visit: Payer: Self-pay | Admitting: Internal Medicine

## 2022-05-04 DIAGNOSIS — M059 Rheumatoid arthritis with rheumatoid factor, unspecified: Secondary | ICD-10-CM

## 2022-05-06 ENCOUNTER — Other Ambulatory Visit: Payer: Self-pay | Admitting: Internal Medicine

## 2022-05-06 DIAGNOSIS — Z79899 Other long term (current) drug therapy: Secondary | ICD-10-CM

## 2022-05-09 ENCOUNTER — Other Ambulatory Visit: Payer: Self-pay | Admitting: Internal Medicine

## 2022-05-09 DIAGNOSIS — M059 Rheumatoid arthritis with rheumatoid factor, unspecified: Secondary | ICD-10-CM

## 2022-05-20 ENCOUNTER — Telehealth: Payer: Self-pay | Admitting: Orthopaedic Surgery

## 2022-05-20 NOTE — Telephone Encounter (Signed)
[employer] forms received. To [employer].

## 2022-05-21 ENCOUNTER — Ambulatory Visit (INDEPENDENT_AMBULATORY_CARE_PROVIDER_SITE_OTHER): Payer: No Typology Code available for payment source | Admitting: Orthopaedic Surgery

## 2022-05-21 DIAGNOSIS — L02512 Cutaneous abscess of left hand: Secondary | ICD-10-CM

## 2022-05-21 NOTE — Progress Notes (Signed)
Post-Op Visit Note   Patient: Taylor Heath           Date of Birth: [birth year]           MRN: [id number] Visit Date: 05/21/2022 PCP: Redmond School, MD   Assessment & Plan:  Chief Complaint:  Chief Complaint  Patient presents with   Left Hand - Routine Post Op   Visit Diagnoses:  1. Abscess of dorsum of left hand     Plan: Beaumont returns today for recheck of his left hand.  He is status post left hand I&D for infection on 01/29/2022.  He has completed hand therapy.  He has no issues.  He has regained quite a bit of extension to his long and ring fingers.  Examination shows fully healed surgical scar.  No signs of infection.  He has about 15 degrees of extensor lag to the long and ring fingers.  He can make a full composite fist.  Overall very happy with his recovery.  At this point he is released to activity as tolerated.  Follow-up as needed.  Follow-Up Instructions: No follow-ups on file.   Orders:  No orders of the defined types were placed in this encounter.  No orders of the defined types were placed in this encounter.   Imaging: No results found.  PMFS History: Patient Active Problem List   Diagnosis Date Noted   Umbilical hernia without obstruction and without gangrene 04/07/2022   Seropositive rheumatoid arthritis ([place]) 02/12/2022   High risk medication use 02/12/2022   Abscess of dorsum of left hand 01/26/2022   Extensor tenosynovitis of left wrist 12/01/2021   Primary osteoarthritis of right wrist 04/26/2017   Radiculopathy of cervical spine 04/26/2017   Total knee replacement status 01/27/2017   Primary osteoarthritis of right shoulder 12/30/2016   Primary osteoarthritis of right knee 12/30/2016   Past Medical History:  Diagnosis Date   Anxiety    Arthritis    GERD (gastroesophageal reflux disease)    History of kidney stones    Pneumonia     Family History  Problem Relation Age of Onset   Rheum arthritis Father     Past Surgical  History:  Procedure Laterality Date   FINGER SURGERY Left    index   I & D EXTREMITY Left 01/27/2022   Procedure: IRRIGATION AND DEBRIDEMENT LEFT HAND;  Surgeon: Leandrew Koyanagi, MD;  Location: [facility];  Service: Orthopedics;  Laterality: Left;   I & D EXTREMITY Left 01/29/2022   Procedure: REPEAT IRRIGATION AND DEBRIDEMENT OF HAND;  Surgeon: Leandrew Koyanagi, MD;  Location: [facility];  Service: Orthopedics;  Laterality: Left;   JOINT REPLACEMENT     left hip replaced   KNEE ARTHROSCOPY W/ ACL RECONSTRUCTION Right 2009   LEG SURGERY Left    left calf   MINOR APPLICATION OF WOUND VAC  01/29/2022   Procedure: APPLICATION OF WOUND VAC;  Surgeon: Leandrew Koyanagi, MD;  Location: [facility];  Service: Orthopedics;;   ORIF FINGER / THUMB FRACTURE     TOTAL KNEE ARTHROPLASTY Right 01/27/2017   Procedure: RIGHT TOTAL KNEE ARTHROPLASTY;  Surgeon: Leandrew Koyanagi, MD;  Location: [facility];  Service: Orthopedics;  Laterality: Right;   Social History   Occupational History   Not on file  Tobacco Use   Smoking status: Former    Types: Cigarettes    Quit date: 04/21/2010    Years since quitting: 12.0    Passive exposure: Never   Smokeless  tobacco: Never  Vaping Use   Vaping Use: Never used  Substance and Sexual Activity   Alcohol use: No   Drug use: No   Sexual activity: Not on file

## 2022-05-24 ENCOUNTER — Ambulatory Visit: Payer: No Typology Code available for payment source | Admitting: Internal Medicine

## 2022-05-25 ENCOUNTER — Telehealth: Payer: Self-pay | Admitting: Orthopaedic Surgery

## 2022-05-25 NOTE — Telephone Encounter (Signed)
Received call from patient's wife Barnett Applebaum called asked if she can get a note stating when patient can return to work. The number to contact Barnett Applebaum is [phone]

## 2022-06-07 ENCOUNTER — Ambulatory Visit: Payer: Managed Care, Other (non HMO) | Admitting: Internal Medicine

## 2022-06-09 ENCOUNTER — Telehealth: Payer: Self-pay | Admitting: Orthopaedic Surgery

## 2022-06-09 NOTE — Telephone Encounter (Signed)
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 Please resend to [employer].

## 2022-06-16 ENCOUNTER — Other Ambulatory Visit: Payer: Self-pay | Admitting: *Deleted

## 2022-06-16 DIAGNOSIS — M059 Rheumatoid arthritis with rheumatoid factor, unspecified: Secondary | ICD-10-CM

## 2022-06-16 MED ORDER — METHOTREXATE SODIUM 2.5 MG PO TABS: 15.0000 mg | ORAL_TABLET | ORAL | 0 refills | Status: DC

## 2022-06-16 NOTE — Telephone Encounter (Signed)
Last Fill: 04/27/2022 30 tabs  Labs: 02/09/2022 normal  Next Visit: 06/30/2022  Last Visit: 04/13/2022  DX: Seropositive rheumatoid arthritis   Current Dose per office note 04/13/2022:  methotrexate (RHEUMATREX) 2.5 MG tablet       Sig: Take 6 tablets (15 mg total) by mouth once a week. Caution:Chemotherapy. Protect from light.      Dispense:  30 tablet      Refill:  0    Okay to refill Methotrexate?

## 2022-06-30 ENCOUNTER — Encounter: Payer: Self-pay | Admitting: Internal Medicine

## 2022-06-30 ENCOUNTER — Ambulatory Visit: Payer: Commercial Managed Care - HMO | Attending: Internal Medicine | Admitting: Internal Medicine

## 2022-06-30 VITALS — BP 136/85 | HR 73 | Resp 16 | Ht 69.0 in | Wt 172.0 lb

## 2022-06-30 DIAGNOSIS — Z79899 Other long term (current) drug therapy: Secondary | ICD-10-CM | POA: Diagnosis not present

## 2022-06-30 DIAGNOSIS — M059 Rheumatoid arthritis with rheumatoid factor, unspecified: Secondary | ICD-10-CM

## 2022-06-30 MED ORDER — FOLIC ACID 1 MG PO TABS: 1.0000 mg | ORAL_TABLET | Freq: Every day | ORAL | 3 refills | Status: DC

## 2022-06-30 MED ORDER — METHOTREXATE SODIUM 2.5 MG PO TABS: 15.0000 mg | ORAL_TABLET | ORAL | 0 refills | Status: DC

## 2022-06-30 NOTE — Progress Notes (Signed)
Office Visit Note  Patient: Taylor Heath             Date of Birth: August 04, 1960           MRN: [id number]             PCP: Elfredia Nevins, MD Referring: Elfredia Nevins, MD Visit Date: 06/30/2022   Subjective:  Follow-up   History of Present Illness: Taylor Heath is a 62 y.o. male here for follow up for seropositive rheumatoid arthritis with complication of left wrist extensor tendon rupture.  Now on methotrexate 15 mg p.o. weekly folic acid 1 mg daily has not noticed any particular drug side effects.  He had some swelling in his right hand and wrist that improved without any additional intervention.  Still has some restriction in range of motion at the left hand and wrist where he has surgical scars on the dorsal side of the hand.  Not causing him any particular functional limitation and not very painful.  He is asking about the folic acid supplementation as he is also on a daily multivitamin that he thinks contains 600 mcg of folic acid.   Previous HPI 04/13/22 Taylor Heath is a 62 y.o. male here for follow up for rheumatoid arthritis with complication of left wrist extensor tendon rupture and prolonged recovery from surgical repair due to localized infection.  He finished his oral doxycycline completely over the interval with no additional infectious disease follow-up scheduled.  He is working with occupational therapy on his extension he is almost able to achieve a full movement on the third and fourth finger.  No additional swelling and surgical scar is well-healed.  He has noticed some more pain in his right wrist that he attributes to relying more heavily on the 1 hand due to the prolonged immobilization and recovery on his left side.  He also has carpal tunnel syndrome which may be contributing.  He is scheduled for umbilical hernia repair surgery on the 20th.   Previous HPI 02/12/22 Taylor Heath is a 62 y.o. male here for seropositive RA. He previously saw Dr. Cardell Peach in  2016 without active disease noted at that time persistent positive CCP Abs.  He was never started on any long-term DMARD.  He has had some degree of chronic joint pain at the hands mostly related to first [facility] joint osteoarthritis.  Otherwise also with some chronic osteoarthritis symptoms affecting the knees and hips.  Recently with complications of tenosynovitis and hand deformity complicated by extensor tendon damage progressing towards possible rupture. Surgical repair with tenolysis was complicated recently with surgical site infection with staph aureus requiring debridement and subsequent prolonged antibiotic on linezolid then doxycycline planned for next month.  Currently symptoms are pretty much at baseline excepting the left wrist which has some stiffness and decreased range of motion with the ongoing healing dorsal surgical wound.  He has a family history his father had severe deforming rheumatoid arthritis.   Labs reviewed 10/2014 RF 106 CCP 34   Imaging reviewed MRI Left Wrist 11/26/21 IMPRESSION: 1. Moderate to high-grade fourth and fifth, moderate second and third, and mild to moderate 6th dorsal extensor compartment tenosynovitis. There is scattered debris and some thin internal septations within the tendon sheath fluid indicating mild complexity. 2. Multiple interstitial tears within the fourth and fifth extensor compartment tendons. Moderate extensor carpi ulnaris tendinosis with small interstitial tears. 3. Possible partial-thickness tear of the distal peripheral triangular fibrocartilage complex. 4. Possible partial-thickness tear of  the proximal aspect of the lunotriquetral ligament. 5. Moderate to severe triscaphe, moderate thumb carpometacarpal, and moderate lunotriquetral cartilage degenerative changes. Scattered carpal subchondral cysts.   Bilateral hand xrays 03/2020 multiple carpal cysts, mild to moderate 1st [facility] joint osteoarthritis   Review of Systems  Constitutional:   Negative for fatigue.  HENT:  Negative for mouth sores and mouth dryness.   Eyes:  Negative for dryness.  Respiratory:  Negative for shortness of breath.   Cardiovascular:  Negative for chest pain and palpitations.  Gastrointestinal:  Negative for blood in stool, constipation and diarrhea.  Endocrine: Negative for increased urination.  Genitourinary:  Negative for involuntary urination.  Musculoskeletal:  Positive for joint swelling and morning stiffness. Negative for joint pain, gait problem, joint pain, myalgias, muscle weakness, muscle tenderness and myalgias.  Skin:  Negative for color change, rash, hair loss and sensitivity to sunlight.  Allergic/Immunologic: Negative for susceptible to infections.  Neurological:  Negative for dizziness and headaches.  Hematological:  Negative for swollen glands.  Psychiatric/Behavioral:  Negative for depressed mood and sleep disturbance. The patient is not nervous/anxious.     PMFS History:  Patient Active Problem List   Diagnosis Date Noted   Umbilical hernia without obstruction and without gangrene 04/07/2022   Seropositive rheumatoid arthritis ([facility]) 02/12/2022   High risk medication use 02/12/2022   Abscess of dorsum of left hand 01/26/2022   Extensor tenosynovitis of left wrist 12/01/2021   Primary osteoarthritis of right wrist 04/26/2017   Radiculopathy of cervical spine 04/26/2017   Total knee replacement status 01/27/2017   Primary osteoarthritis of right shoulder 12/30/2016   Primary osteoarthritis of right knee 12/30/2016    Past Medical History:  Diagnosis Date   Anxiety    Arthritis    GERD (gastroesophageal reflux disease)    History of kidney stones    Pneumonia     Family History  Problem Relation Age of Onset   Rheum arthritis Father    Past Surgical History:  Procedure Laterality Date   FINGER SURGERY Left    index   HERNIA REPAIR  04/30/2022   I & D EXTREMITY Left 01/27/2022   Procedure: IRRIGATION AND  DEBRIDEMENT LEFT HAND;  Surgeon: Tarry Kos, MD;  Location: [facility];  Service: Orthopedics;  Laterality: Left;   I & D EXTREMITY Left 01/29/2022   Procedure: REPEAT IRRIGATION AND DEBRIDEMENT OF HAND;  Surgeon: Tarry Kos, MD;  Location: [facility];  Service: Orthopedics;  Laterality: Left;   JOINT REPLACEMENT     left hip replaced   KNEE ARTHROSCOPY W/ ACL RECONSTRUCTION Right 2009   LEG SURGERY Left    left calf   MINOR APPLICATION OF WOUND VAC  01/29/2022   Procedure: APPLICATION OF WOUND VAC;  Surgeon: Tarry Kos, MD;  Location: [facility];  Service: Orthopedics;;   ORIF FINGER / THUMB FRACTURE     TOTAL KNEE ARTHROPLASTY Right 01/27/2017   Procedure: RIGHT TOTAL KNEE ARTHROPLASTY;  Surgeon: Tarry Kos, MD;  Location: [facility];  Service: Orthopedics;  Laterality: Right;   Social History   Social History Narrative   Not on file    There is no immunization history on file for this patient.   Objective: Vital Signs: BP 136/85 (BP Location: Left Arm, Patient Position: Sitting, Cuff Size: Normal)   Pulse 73   Resp 16   Ht 5\' 9"  (1.753 m)   Wt 172 lb (78 kg)   BMI 25.40 kg/m    Physical Exam Cardiovascular:  Rate and Rhythm: Normal rate and regular rhythm.  Pulmonary:     Effort: Pulmonary effort is normal.     Breath sounds: Normal breath sounds.  Lymphadenopathy:     Cervical: No cervical adenopathy.  Skin:    General: Skin is warm and dry.     Findings: No rash.  Neurological:     Mental Status: He is alert.  Psychiatric:        Mood and Affect: Mood normal.      Musculoskeletal Exam:  Shoulders full ROM no tenderness or swelling Elbows full ROM no tenderness or swelling Wrists full ROM no tenderness or swelling Closed surgical scar on back of left wrist, extension range of motion in the third and fourth finger is partially limited with no associated swelling Knees full ROM no tenderness or swelling   CDAI Exam: CDAI Score: 0  Patient Global: 0 mm; Provider  Global: 0 mm Swollen: 0 ; Tender: 0  Joint Exam 06/30/2022   All documented joints were normal     Investigation: No additional findings.  Imaging: No results found.  Recent Labs: Lab Results  Component Value Date   WBC 5.2 06/30/2022   HGB 14.9 06/30/2022   PLT 289 06/30/2022   NA 141 06/30/2022   K 4.5 06/30/2022   CL 105 06/30/2022   CO2 27 06/30/2022   GLUCOSE 89 06/30/2022   BUN 16 06/30/2022   CREATININE 0.75 06/30/2022   BILITOT 0.5 06/30/2022   ALKPHOS 65 01/19/2017   AST 25 06/30/2022   ALT 37 06/30/2022   PROT 6.9 06/30/2022   ALBUMIN 4.5 01/19/2017   CALCIUM 9.5 06/30/2022   GFRAA >60 01/28/2017   QFTBGOLDPLUS NEGATIVE 02/12/2022    Speciality Comments: No specialty comments available.  Procedures:  No procedures performed Allergies: Meperidine   Assessment / Plan:     Visit Diagnoses: Seropositive rheumatoid arthritis (HCC) - Plan: methotrexate (RHEUMATREX) 2.5 MG tablet, folic acid (FOLVITE) 1 MG tablet, Sedimentation rate  Appears to be doing well though he never had significant symptomatic complaints prior to progression of inflammatory tenosynovitis changes and subsequent rupture.  Will recheck sed rate for disease activity monitoring.  Plan to continue methotrexate at 15 mg p.o. weekly.  Discussed if he confirms home folic acid supplement is adequate can continue this else continue with 1 mg daily.  High risk medication use - Plan: CBC with Differential/Platelet, COMPLETE METABOLIC PANEL WITH GFR  Checking CBC and CMP for medication monitoring on methotrexate.  He has not had a new interval infections or medication intolerance reported.  Orders: Orders Placed This Encounter  Procedures   Sedimentation rate   CBC with Differential/Platelet   COMPLETE METABOLIC PANEL WITH GFR   Meds ordered this encounter  Medications   methotrexate (RHEUMATREX) 2.5 MG tablet    Sig: Take 6 tablets (15 mg total) by mouth once a week. Caution:Chemotherapy.  Protect from light.    Dispense:  72 tablet    Refill:  0   folic acid (FOLVITE) 1 MG tablet    Sig: Take 1 tablet (1 mg total) by mouth daily.    Dispense:  90 tablet    Refill:  3     Follow-Up Instructions: Return in about 3 months (around 09/30/2022) for RA on MTX f/u 3mos.   Fuller Plan, MD  Note - This record has been created using [employer].  Chart creation errors have been sought, but may not always  have been located. Such creation errors do  not reflect on  the standard of medical care.

## 2022-07-01 LAB — COMPLETE METABOLIC PANEL WITH GFR
AG Ratio: 1.7 (calc) (ref 1.0–2.5)
ALT: 37 U/L (ref 9–46)
AST: 25 U/L (ref 10–35)
Albumin: 4.3 g/dL (ref 3.6–5.1)
Alkaline phosphatase (APISO): 72 U/L (ref 35–144)
BUN: 16 mg/dL (ref 7–25)
CO2: 27 mmol/L (ref 20–32)
Calcium: 9.5 mg/dL (ref 8.6–10.3)
Chloride: 105 mmol/L (ref 98–110)
Creat: 0.75 mg/dL (ref 0.70–1.35)
Globulin: 2.6 g/dL (calc) (ref 1.9–3.7)
Glucose, Bld: 89 mg/dL (ref 65–99)
Potassium: 4.5 mmol/L (ref 3.5–5.3)
Sodium: 141 mmol/L (ref 135–146)
Total Bilirubin: 0.5 mg/dL (ref 0.2–1.2)
Total Protein: 6.9 g/dL (ref 6.1–8.1)
eGFR: 103 mL/min/{1.73_m2} (ref >=60)

## 2022-07-01 LAB — CBC WITH DIFFERENTIAL/PLATELET
Absolute Monocytes: 348 cells/uL (ref 200–950)
Basophils Absolute: 31 cells/uL (ref 0–200)
Basophils Relative: 0.6 %
Eosinophils Absolute: 177 cells/uL (ref 15–500)
Eosinophils Relative: 3.4 %
HCT: 44.7 % (ref 38.5–50.0)
Hemoglobin: 14.9 g/dL (ref 13.2–17.1)
Lymphs Abs: 2033 cells/uL (ref 850–3900)
MCH: 30.5 pg (ref 27.0–33.0)
MCHC: 33.3 g/dL (ref 32.0–36.0)
MCV: 91.6 fL (ref 80.0–100.0)
MPV: 9.4 fL (ref 7.5–12.5)
Monocytes Relative: 6.7 %
Neutro Abs: 2610 cells/uL (ref 1500–7800)
Neutrophils Relative %: 50.2 %
Platelets: 289 10*3/uL (ref 140–400)
RBC: 4.88 10*6/uL (ref 4.20–5.80)
RDW: 13.6 % (ref 11.0–15.0)
Total Lymphocyte: 39.1 %
WBC: 5.2 10*3/uL (ref 3.8–10.8)

## 2022-07-01 LAB — SEDIMENTATION RATE: Sed Rate: 6 mm/h (ref 0–20)

## 2022-09-10 ENCOUNTER — Other Ambulatory Visit: Payer: Commercial Managed Care - HMO

## 2022-09-10 DIAGNOSIS — R972 Elevated prostate specific antigen [PSA]: Secondary | ICD-10-CM

## 2022-09-11 LAB — PSA, TOTAL AND FREE
PSA, Free Pct: 16.3 %
PSA, Free: 0.49 ng/mL
Prostate Specific Ag, Serum: 3 ng/mL (ref 0.0–4.0)

## 2022-09-17 ENCOUNTER — Ambulatory Visit: Payer: Managed Care, Other (non HMO) | Admitting: Urology

## 2022-09-29 NOTE — Progress Notes (Signed)
Office Visit Note  Patient: Taylor Heath             Date of Birth: 24-Aug-1960           MRN: [id number]             PCP: Elfredia Nevins, MD Referring: Elfredia Nevins, MD Visit Date: 10/05/2022   Subjective:  Follow-up (Patient would like to talk about diclofenac. )   History of Present Illness: Taylor Heath is a 62 y.o. male here for follow up for seropositive rheumatoid arthritis with complication of left wrist extensor tendon rupture. Now on methotrexate 15 mg p.o. weekly folic acid 1 mg daily.  He has also been taking diclofenac one half of a 75 mg tablet twice daily.  Feels arthritis symptoms are well-controlled not having significant morning joint pain or stiffness.  He has been staying physically active usually with golf 2 to 3 days/week feels his leg strength and mobility is a little bit improved.  Still has small amount of swelling around the left wrist and thumb but decreased compared to our last visit.  Previous HPI 06/30/2022  Taylor Heath is a 62 y.o. male here for follow up for seropositive rheumatoid arthritis with complication of left wrist extensor tendon rupture.  Now on methotrexate 15 mg p.o. weekly folic acid 1 mg daily has not noticed any particular drug side effects.  He had some swelling in his right hand and wrist that improved without any additional intervention.  Still has some restriction in range of motion at the left hand and wrist where he has surgical scars on the dorsal side of the hand.  Not causing him any particular functional limitation and not very painful.  He is asking about the folic acid supplementation as he is also on a daily multivitamin that he thinks contains 600 mcg of folic acid.   Previous HPI 04/13/22 Taylor Heath is a 62 y.o. male here for follow up for rheumatoid arthritis with complication of left wrist extensor tendon rupture and prolonged recovery from surgical repair due to localized infection.  He finished his oral  doxycycline completely over the interval with no additional infectious disease follow-up scheduled.  He is working with occupational therapy on his extension he is almost able to achieve a full movement on the third and fourth finger.  No additional swelling and surgical scar is well-healed.  He has noticed some more pain in his right wrist that he attributes to relying more heavily on the 1 hand due to the prolonged immobilization and recovery on his left side.  He also has carpal tunnel syndrome which may be contributing.  He is scheduled for umbilical hernia repair surgery on the 20th.   Previous HPI 02/12/22 Taylor Heath is a 62 y.o. male here for seropositive RA. He previously saw Dr. Cardell Peach in 2016 without active disease noted at that time persistent positive CCP Abs.  He was never started on any long-term DMARD.  He has had some degree of chronic joint pain at the hands mostly related to first [facility] joint osteoarthritis.  Otherwise also with some chronic osteoarthritis symptoms affecting the knees and hips.  Recently with complications of tenosynovitis and hand deformity complicated by extensor tendon damage progressing towards possible rupture. Surgical repair with tenolysis was complicated recently with surgical site infection with staph aureus requiring debridement and subsequent prolonged antibiotic on linezolid then doxycycline planned for next month.  Currently symptoms are pretty much at baseline excepting  the left wrist which has some stiffness and decreased range of motion with the ongoing healing dorsal surgical wound.  He has a family history his father had severe deforming rheumatoid arthritis.   Labs reviewed 10/2014 RF 106 CCP 34   Imaging reviewed MRI Left Wrist 11/26/21 IMPRESSION: 1. Moderate to high-grade fourth and fifth, moderate second and third, and mild to moderate 6th dorsal extensor compartment tenosynovitis. There is scattered debris and some thin internal septations  within the tendon sheath fluid indicating mild complexity. 2. Multiple interstitial tears within the fourth and fifth extensor compartment tendons. Moderate extensor carpi ulnaris tendinosis with small interstitial tears. 3. Possible partial-thickness tear of the distal peripheral triangular fibrocartilage complex. 4. Possible partial-thickness tear of the proximal aspect of the lunotriquetral ligament. 5. Moderate to severe triscaphe, moderate thumb carpometacarpal, and moderate lunotriquetral cartilage degenerative changes. Scattered carpal subchondral cysts.   Bilateral hand xrays 03/2020 multiple carpal cysts, mild to moderate 1st CMC joint osteoarthritis   Review of Systems  Constitutional:  Negative for fatigue.  HENT:  Negative for mouth sores and mouth dryness.   Eyes:  Negative for dryness.  Respiratory:  Negative for shortness of breath.   Cardiovascular:  Negative for chest pain and palpitations.  Gastrointestinal:  Negative for blood in stool, constipation and diarrhea.  Endocrine: Negative for increased urination.  Genitourinary:  Negative for involuntary urination.  Musculoskeletal:  Positive for morning stiffness. Negative for joint pain, gait problem, joint pain, joint swelling, myalgias, muscle weakness, muscle tenderness and myalgias.  Skin:  Negative for color change, rash, hair loss and sensitivity to sunlight.  Allergic/Immunologic: Negative for susceptible to infections.  Neurological:  Negative for dizziness and headaches.  Hematological:  Negative for swollen glands.  Psychiatric/Behavioral:  Negative for depressed mood and sleep disturbance. The patient is not nervous/anxious.     PMFS History:  Patient Active Problem List   Diagnosis Date Noted   Umbilical hernia without obstruction and without gangrene 04/07/2022   Seropositive rheumatoid arthritis (HCC) 02/12/2022   High risk medication use 02/12/2022   Abscess of dorsum of left hand 01/26/2022   Extensor  tenosynovitis of left wrist 12/01/2021   Primary osteoarthritis of right wrist 04/26/2017   Radiculopathy of cervical spine 04/26/2017   Total knee replacement status 01/27/2017   Primary osteoarthritis of right shoulder 12/30/2016   Primary osteoarthritis of right knee 12/30/2016    Past Medical History:  Diagnosis Date   Anxiety    Arthritis    GERD (gastroesophageal reflux disease)    History of kidney stones    Pneumonia     Family History  Problem Relation Age of Onset   Rheum arthritis Father    Past Surgical History:  Procedure Laterality Date   FINGER SURGERY Left    index   HERNIA REPAIR  04/30/2022   I & D EXTREMITY Left 01/27/2022   Procedure: IRRIGATION AND DEBRIDEMENT LEFT HAND;  Surgeon: Tarry Kos, MD;  [facility];  Service: Orthopedics;  Laterality: Left;   I & D EXTREMITY Left 01/29/2022   Procedure: REPEAT IRRIGATION AND DEBRIDEMENT OF HAND;  Surgeon: Tarry Kos, MD;  Location: [facility];  Service: Orthopedics;  Laterality: Left;   JOINT REPLACEMENT     left hip replaced   KNEE ARTHROSCOPY W/ ACL RECONSTRUCTION Right 2009   LEG SURGERY Left    left calf   MINOR APPLICATION OF WOUND VAC  01/29/2022   Procedure: APPLICATION OF WOUND VAC;  Surgeon: Tarry Kos, MD;  Location: [facility];  Service: Orthopedics;;   ORIF FINGER / THUMB FRACTURE     TOTAL KNEE ARTHROPLASTY Right 01/27/2017   Procedure: RIGHT TOTAL KNEE ARTHROPLASTY;  Surgeon: Tarry Kos, MD;  [facility]: [facility];  Service: Orthopedics;  Laterality: Right;   Social History   Social History Narrative   Not on file    There is no immunization history on file for this patient.   Objective: Vital Signs: BP 122/84 (BP Location: Left Arm, Patient Position: Sitting, Cuff Size: Normal)   Pulse 73   Resp 12   Ht 5\' 9"  (1.753 m)   Wt 169 lb (76.7 kg)   BMI 24.96 kg/m    Physical Exam Eyes:     Conjunctiva/sclera: Conjunctivae normal.  Cardiovascular:     Rate and Rhythm: Normal rate and  regular rhythm.  Pulmonary:     Effort: Pulmonary effort is normal.     Breath sounds: Normal breath sounds.  Musculoskeletal:     Right lower leg: No edema.     Left lower leg: No edema.  Lymphadenopathy:     Cervical: No cervical adenopathy.  Skin:    General: Skin is warm and dry.     Findings: No rash.  Neurological:     Mental Status: He is alert.  Psychiatric:        Mood and Affect: Mood normal.      Musculoskeletal Exam:  Shoulders full ROM no tenderness or swelling Elbows full ROM no tenderness or swelling Wrists full ROM no tenderness or swelling Fingers full ROM, heberdon's nodes b/l hands, left 1st CMC and wrist swelling at radial border, no tenderness Knees full ROM no tenderness or swelling   CDAI Exam: CDAI Score: 2  Patient Global: 10 / 100; Provider Global: 10 / 100 Swollen: 1 ; Tender: 0  Joint Exam 10/05/2022      Right  Left  CMC     Swollen      Investigation: No additional findings.  Imaging: No results found.  Recent Labs: Lab Results  Component Value Date   WBC 5.2 06/30/2022   HGB 14.9 06/30/2022   PLT 289 06/30/2022   NA 141 06/30/2022   K 4.5 06/30/2022   CL 105 06/30/2022   CO2 27 06/30/2022   GLUCOSE 89 06/30/2022   BUN 16 06/30/2022   CREATININE 0.75 06/30/2022   BILITOT 0.5 06/30/2022   ALKPHOS 65 01/19/2017   AST 25 06/30/2022   ALT 37 06/30/2022   PROT 6.9 06/30/2022   ALBUMIN 4.5 01/19/2017   CALCIUM 9.5 06/30/2022   GFRAA >60 01/28/2017   QFTBGOLDPLUS NEGATIVE 02/12/2022    Speciality Comments: No specialty comments available.  Procedures:  No procedures performed Allergies: Meperidine   Assessment / Plan:     Visit Diagnoses: Seropositive rheumatoid arthritis (HCC) - Plan: Sedimentation rate, methotrexate (RHEUMATREX) 2.5 MG tablet  Joint inflammation appears well-controlled.  Checking sed rate for disease activity monitoring.  There are some residual swelling at the left hand without any pain and wrist  range of motion appears mildly improved.  Plan to continue methotrexate 15 mg p.o. weekly folic acid 1 mg daily.  We discussed his diclofenac has been taking just 1/2 tablet twice daily stable on this dose for about 6 years.  I think he is low risk of complications currently on PPI, blood pressure is normal, renal function is normal we will be monitoring metabolic panel closely for methotrexate.  High risk medication use - methotrexate at 15  mg p.o. weekly.  Discussed if he confirms home folic acid supplement is adequate can continue this else continue with 1 mg daily. - Plan: CBC with Differential/Platelet, COMPLETE METABOLIC PANEL WITH GFR  Checking CBC and CMP for medication monitoring on continued use of methotrexate and diclofenac.  No serious interval infections.  Besides possible mild fatigue in the day following weekly dose no reported side effects and this is decreased with repeated doses.  Orders: Orders Placed This Encounter  Procedures   Sedimentation rate   CBC with Differential/Platelet   COMPLETE METABOLIC PANEL WITH GFR   Meds ordered this encounter  Medications   methotrexate (RHEUMATREX) 2.5 MG tablet    Sig: Take 6 tablets (15 mg total) by mouth once a week. Caution:Chemotherapy. Protect from light.    Dispense:  78 tablet    Refill:  0   diclofenac (VOLTAREN) 75 MG EC tablet    Sig: Take 1 tablet (75 mg total) by mouth daily. Take 1/2 tablet twice daily as needed    Dispense:  90 tablet    Refill:  1     Follow-Up Instructions: Return in about 3 months (around 01/05/2023) for RA on MTX/NSAID f/u 3mos.   Fuller Plan, MD  Note - This record has been created using [employer].  Chart creation errors have been sought, but may not always  have been located. Such creation errors do not reflect on  the standard of medical care.

## 2022-09-30 ENCOUNTER — Ambulatory Visit: Payer: 59 | Admitting: Internal Medicine

## 2022-09-30 ENCOUNTER — Other Ambulatory Visit: Payer: Self-pay | Admitting: Internal Medicine

## 2022-09-30 DIAGNOSIS — M059 Rheumatoid arthritis with rheumatoid factor, unspecified: Secondary | ICD-10-CM

## 2022-09-30 NOTE — Telephone Encounter (Signed)
Last Fill: 06/30/2022  Labs: 06/30/2022 normal  Next Visit: 10/05/2022  Last Visit: 06/30/2022  DX: Seropositive rheumatoid arthritis   Current Dose per office note 06/30/2022: methotrexate at 15 mg p.o. weekly.   Patient to update labs at upcomming appointment.  Okay to refill Methotrexate?

## 2022-10-05 ENCOUNTER — Encounter: Payer: Self-pay | Admitting: Internal Medicine

## 2022-10-05 ENCOUNTER — Ambulatory Visit: Payer: Commercial Managed Care - HMO | Attending: Internal Medicine | Admitting: Internal Medicine

## 2022-10-05 VITALS — BP 122/84 | HR 73 | Resp 12 | Ht 69.0 in | Wt 169.0 lb

## 2022-10-05 DIAGNOSIS — M059 Rheumatoid arthritis with rheumatoid factor, unspecified: Secondary | ICD-10-CM | POA: Diagnosis not present

## 2022-10-05 DIAGNOSIS — Z79899 Other long term (current) drug therapy: Secondary | ICD-10-CM

## 2022-10-05 LAB — CBC WITH DIFFERENTIAL/PLATELET
Absolute Monocytes: 462 cells/uL (ref 200–950)
Basophils Absolute: 34 cells/uL (ref 0–200)
Basophils Relative: 0.5 %
Eosinophils Absolute: 154 cells/uL (ref 15–500)
Eosinophils Relative: 2.3 %
HCT: 43.5 % (ref 38.5–50.0)
Hemoglobin: 14.6 g/dL (ref 13.2–17.1)
Lymphs Abs: 1802 cells/uL (ref 850–3900)
MCH: 31.7 pg (ref 27.0–33.0)
MCHC: 33.6 g/dL (ref 32.0–36.0)
MCV: 94.6 fL (ref 80.0–100.0)
MPV: 9.3 fL (ref 7.5–12.5)
Monocytes Relative: 6.9 %
Neutro Abs: 4248 cells/uL (ref 1500–7800)
Neutrophils Relative %: 63.4 %
Platelets: 293 10*3/uL (ref 140–400)
RBC: 4.6 10*6/uL (ref 4.20–5.80)
RDW: 13.5 % (ref 11.0–15.0)
Total Lymphocyte: 26.9 %
WBC: 6.7 10*3/uL (ref 3.8–10.8)

## 2022-10-05 LAB — SEDIMENTATION RATE: Sed Rate: 6 mm/h (ref 0–20)

## 2022-10-05 MED ORDER — DICLOFENAC SODIUM 75 MG PO TBEC: 75.0000 mg | DELAYED_RELEASE_TABLET | Freq: Every day | ORAL | 1 refills | Status: DC

## 2022-10-05 MED ORDER — METHOTREXATE SODIUM 2.5 MG PO TABS: 15.0000 mg | ORAL_TABLET | ORAL | 0 refills | Status: DC

## 2022-11-19 ENCOUNTER — Encounter: Payer: Self-pay | Admitting: Urology

## 2022-11-19 ENCOUNTER — Ambulatory Visit: Payer: Commercial Managed Care - HMO | Admitting: Urology

## 2022-11-19 VITALS — BP 121/71 | HR 70

## 2022-11-19 DIAGNOSIS — R972 Elevated prostate specific antigen [PSA]: Secondary | ICD-10-CM | POA: Diagnosis not present

## 2022-11-19 NOTE — Patient Instructions (Signed)

## 2022-11-19 NOTE — Progress Notes (Unsigned)
11/19/2022 12:30 PM   Taylor Heath [birth year] [id number]  Referring provider: Elfredia Nevins, MD [address] [place],  [place] [address]  No chief complaint on file.   HPI: Mr Canter is a 62yo here for followup for elevated PSA. PSA 3.0. IPSS 5 QOL 1. Nocturia 1x. Urine stream strong. No dysuria or hematuria.    PMH: Past Medical History:  Diagnosis Date   Anxiety    Arthritis    GERD (gastroesophageal reflux disease)    History of kidney stones    Pneumonia     Surgical History: Past Surgical History:  Procedure Laterality Date   FINGER SURGERY Left    index   HERNIA REPAIR  04/30/2022   I & D EXTREMITY Left 01/27/2022   Procedure: IRRIGATION AND DEBRIDEMENT LEFT HAND;  Surgeon: Tarry Kos, MD;  [facility];  Service: Orthopedics;  Laterality: Left;   I & D EXTREMITY Left 01/29/2022   Procedure: REPEAT IRRIGATION AND DEBRIDEMENT OF HAND;  Surgeon: Tarry Kos, MD;  [facility];  Service: Orthopedics;  Laterality: Left;   JOINT REPLACEMENT     left hip replaced   KNEE ARTHROSCOPY W/ ACL RECONSTRUCTION Right 2009   LEG SURGERY Left    left calf   MINOR APPLICATION OF WOUND VAC  01/29/2022   Procedure: APPLICATION OF WOUND VAC;  Surgeon: Tarry Kos, MD;  [facility];  Service: Orthopedics;;   ORIF FINGER / THUMB FRACTURE     TOTAL KNEE ARTHROPLASTY Right 01/27/2017   Procedure: RIGHT TOTAL KNEE ARTHROPLASTY;  Surgeon: Tarry Kos, MD;  [facility];  Service: Orthopedics;  Laterality: Right;    Home Medications:  Allergies as of 11/19/2022       Reactions   Meperidine    Unknown reaction         Medication List        Accurate as of November 19, 2022 12:30 PM. If you have any questions, ask your nurse or doctor.          acetaminophen 325 MG tablet Commonly known as: TYLENOL Take 650 mg by mouth every 6 (six) hours as needed for moderate pain.   diclofenac 75 MG EC tablet Commonly known as: VOLTAREN Take 1  tablet (75 mg total) by mouth daily. Take 1/2 tablet twice daily as needed   FISH OIL PO Take 900 mg by mouth daily.   folic acid 1 MG tablet Commonly known as: FOLVITE Take 1 tablet (1 mg total) by mouth daily.   HYDROcodone-acetaminophen 5-325 MG tablet Commonly known as: Norco Take 1 tablet by mouth 3 (three) times daily as needed. To be taken after surgery   ibuprofen 200 MG tablet Commonly known as: ADVIL Take 200 mg by mouth every 6 (six) hours as needed.   methotrexate 2.5 MG tablet Commonly known as: RHEUMATREX Take 6 tablets (15 mg total) by mouth once a week. Caution:Chemotherapy. Protect from light.   MULTIVITAMIN PO Take 1 tablet daily by mouth.   mupirocin ointment 2 % Commonly known as: BACTROBAN Apply 1 Application topically daily.   omeprazole 20 MG tablet Commonly known as: PRILOSEC OTC Take 20 mg daily by mouth.   ondansetron 4 MG tablet Commonly known as: Zofran Take 1 tablet (4 mg total) by mouth every 8 (eight) hours as needed for nausea or vomiting.   pravastatin 40 MG tablet Commonly known as: PRAVACHOL Take 40 mg daily by mouth.   valACYclovir 500 MG tablet Commonly  known as: VALTREX Take 500 mg daily by mouth.        Allergies:  Allergies  Allergen Reactions   Meperidine     Unknown reaction     Family History: Family History  Problem Relation Age of Onset   Rheum arthritis Father     Social History:  reports that he quit smoking about 12 years ago. His smoking use included cigarettes. He has never been exposed to tobacco smoke. He has never used smokeless tobacco. He reports that he does not drink alcohol and does not use drugs.  ROS: All other review of systems were reviewed and are negative except what is noted above in HPI  Physical Exam: BP 121/71   Pulse 70   Constitutional:  Alert and oriented, No acute distress. HEENT: [place] AT, moist mucus membranes.  Trachea midline, no masses. Cardiovascular: No clubbing,  cyanosis, or edema. Respiratory: Normal respiratory effort, no increased work of breathing. GI: Abdomen is soft, nontender, nondistended, no abdominal masses GU: No CVA tenderness.  Lymph: No cervical or inguinal lymphadenopathy. Skin: No rashes, bruises or suspicious lesions. Neurologic: Grossly intact, no focal deficits, moving all 4 extremities. Psychiatric: Normal mood and affect.  Laboratory Data: Lab Results  Component Value Date   WBC 6.7 10/05/2022   HGB 14.6 10/05/2022   HCT 43.5 10/05/2022   MCV 94.6 10/05/2022   PLT 293 10/05/2022    Lab Results  Component Value Date   CREATININE 0.87 10/05/2022    No results found for: "PSA"  No results found for: "TESTOSTERONE"  No results found for: "HGBA1C"  Urinalysis    Component Value Date/Time   COLORURINE STRAW (A) 01/27/2017 1038   APPEARANCEUR Clear 03/19/2022 1118   LABSPEC 1.008 01/27/2017 1038   PHURINE 6.0 01/27/2017 1038   GLUCOSEU Negative 03/19/2022 1118   HGBUR MODERATE (A) 01/27/2017 1038   BILIRUBINUR Negative 03/19/2022 1118   KETONESUR NEGATIVE 01/27/2017 1038   PROTEINUR 1+ (A) 03/19/2022 1118   PROTEINUR NEGATIVE 01/27/2017 1038   NITRITE Negative 03/19/2022 1118   NITRITE NEGATIVE 01/27/2017 1038   LEUKOCYTESUR Negative 03/19/2022 1118    Lab Results  Component Value Date   LABMICR See below: 03/19/2022   WBCUA 0-5 03/19/2022   LABEPIT 0-10 03/19/2022   BACTERIA None seen 03/19/2022    Pertinent Imaging: *** No results found for this or any previous visit.  No results found for this or any previous visit.  No results found for this or any previous visit.  No results found for this or any previous visit.  No results found for this or any previous visit.  No valid procedures specified. No results found for this or any previous visit.  No results found for this or any previous visit.   Assessment & Plan:    1. Elevated PSA -followup 1 year with PSA - Urinalysis, Routine w  reflex microscopic   No follow-ups on file.  Wilkie Aye, MD  [facility]

## 2022-11-22 ENCOUNTER — Other Ambulatory Visit: Payer: Self-pay | Admitting: Internal Medicine

## 2022-11-22 ENCOUNTER — Encounter (INDEPENDENT_AMBULATORY_CARE_PROVIDER_SITE_OTHER): Payer: Self-pay | Admitting: *Deleted

## 2022-11-22 DIAGNOSIS — M059 Rheumatoid arthritis with rheumatoid factor, unspecified: Secondary | ICD-10-CM

## 2022-12-09 ENCOUNTER — Other Ambulatory Visit: Payer: Self-pay | Admitting: Internal Medicine

## 2022-12-09 DIAGNOSIS — M059 Rheumatoid arthritis with rheumatoid factor, unspecified: Secondary | ICD-10-CM

## 2022-12-15 ENCOUNTER — Ambulatory Visit: Payer: Commercial Managed Care - HMO | Admitting: Orthopaedic Surgery

## 2022-12-15 ENCOUNTER — Encounter: Payer: Self-pay | Admitting: Orthopaedic Surgery

## 2022-12-15 DIAGNOSIS — M65932 Unspecified synovitis and tenosynovitis, left forearm: Secondary | ICD-10-CM | POA: Diagnosis not present

## 2022-12-15 NOTE — Progress Notes (Signed)
Office Visit Note   Patient: Taylor Heath           Date of Birth: 1961-01-06           MRN: [id number] Visit Date: 12/15/2022              Requested by: Elfredia Nevins, MD [address] [place],  [place] [address] PCP: Elfredia Nevins, MD   Assessment & Plan: Visit Diagnoses:  1. Extensor tenosynovitis of left wrist     Plan: Lorentz is a 62 year old gentleman with rheumatoid arthritis with the tenosynovitis of the left third compartment.  Based on these findings I would like to refer him to Dr. Fara Boros for further evaluation and treatment.  Follow-Up Instructions: No follow-ups on file.   Orders:  No orders of the defined types were placed in this encounter.  No orders of the defined types were placed in this encounter.     Procedures: No procedures performed   Clinical Data: No additional findings.   Subjective: Chief Complaint  Patient presents with   Left Hand - Pain    HPI [name] is a 62 year old gentleman who comes in for evaluation of swelling to the left dorsal wrist.  He has rheumatoid arthritis and we did tenosynovectomy in November of last year which was complicated by infection.  He is currently on methotrexate.  He has noticed swelling over the third dorsal wrist compartment in the last couple months or so. Review of Systems   Objective: Vital Signs: There were no vitals taken for this visit.  Physical Exam  Ortho Exam Exam of the left wrist shows swelling over the dorsal third compartment.  EPL function intact.  No signs of infection. Specialty Comments:  No specialty comments available.  Imaging: No results found.   PMFS History: Patient Active Problem List   Diagnosis Date Noted   Umbilical hernia without obstruction and without gangrene 04/07/2022   Seropositive rheumatoid arthritis (HCC) 02/12/2022   High risk medication use 02/12/2022   Abscess of dorsum of left hand 01/26/2022   Extensor tenosynovitis of left wrist  12/01/2021   Primary osteoarthritis of right wrist 04/26/2017   Radiculopathy of cervical spine 04/26/2017   Total knee replacement status 01/27/2017   Primary osteoarthritis of right shoulder 12/30/2016   Primary osteoarthritis of right knee 12/30/2016   Past Medical History:  Diagnosis Date   Anxiety    Arthritis    GERD (gastroesophageal reflux disease)    History of kidney stones    Pneumonia     Family History  Problem Relation Age of Onset   Rheum arthritis Father     Past Surgical History:  Procedure Laterality Date   FINGER SURGERY Left    index   HERNIA REPAIR  04/30/2022   I & D EXTREMITY Left 01/27/2022   Procedure: IRRIGATION AND DEBRIDEMENT LEFT HAND;  Surgeon: Tarry Kos, MD;  [facility];  Service: Orthopedics;  Laterality: Left;   I & D EXTREMITY Left 01/29/2022   Procedure: REPEAT IRRIGATION AND DEBRIDEMENT OF HAND;  Surgeon: Tarry Kos, MD;  [facility]: [facility];  Service: Orthopedics;  Laterality: Left;   JOINT REPLACEMENT     left hip replaced   KNEE ARTHROSCOPY W/ ACL RECONSTRUCTION Right 2009   LEG SURGERY Left    left calf   MINOR APPLICATION OF WOUND VAC  01/29/2022   Procedure: APPLICATION OF WOUND VAC;  Surgeon: Tarry Kos, MD;  Location: [facility];  Service: Orthopedics;;  ORIF FINGER / THUMB FRACTURE     TOTAL KNEE ARTHROPLASTY Right 01/27/2017   Procedure: RIGHT TOTAL KNEE ARTHROPLASTY;  Surgeon: Tarry Kos, MD;  Location: [facility];  Service: Orthopedics;  Laterality: Right;   Social History   Occupational History   Not on file  Tobacco Use   Smoking status: Former    Current packs/day: 0.00    Types: Cigarettes    Quit date: 04/21/2010    Years since quitting: 12.6    Passive exposure: Never   Smokeless tobacco: Never  Vaping Use   Vaping status: Never Used  Substance and Sexual Activity   Alcohol use: No   Drug use: No   Sexual activity: Not on file

## 2022-12-28 NOTE — Progress Notes (Signed)
Office Visit Note  Patient: Taylor Heath             Date of Birth: 10-29-1960           MRN: [id number]             PCP: Elfredia Nevins, MD Referring: Elfredia Nevins, MD Visit Date: 01/11/2023   Subjective:  Cyst of the Left Hand (Appt with Hand surgeon on 11/25 ([facility]) wants to make sure Dr Dimple Casey is aware. Hx of MRSA with previous surgery. Concerned with losing function in Thumb and very concerned it will affect his love for golf.) and Follow-up   History of Present Illness: Taylor Heath is a 62 y.o. male here for follow up for seropositive rheumatoid arthritis with complication of left wrist extensor tendon rupture. Now on methotrexate 15 mg p.o. weekly folic acid 1 mg daily.  He has also been taking diclofenac 75 mg half a tablet twice daily.  He has not experienced any major change in joint pain or mobility.  Left wrist is still limited in extension.  He has been getting increased swelling near the base of the thumb not associated with any pain redness or warmth in that area.  He followed up with Dr. Roda Shutters did not do any new procedure did recommend considering hand surgical specialist consultation.  He is concerned about this affecting his left wrist and thumb movement worse which would limit golfing.  Previous HPI 10/05/2022 Taylor Heath is a 61 y.o. male here for follow up for seropositive rheumatoid arthritis with complication of left wrist extensor tendon rupture. Now on methotrexate 15 mg p.o. weekly folic acid 1 mg daily.  He has also been taking diclofenac one half of a 75 mg tablet twice daily.  Feels arthritis symptoms are well-controlled not having significant morning joint pain or stiffness.  He has been staying physically active usually with golf 2 to 3 days/week feels his leg strength and mobility is a little bit improved.  Still has small amount of swelling around the left wrist and thumb but decreased compared to our last visit.   Previous HPI 06/30/2022  Taylor Heath is a 62 y.o. male here for follow up for seropositive rheumatoid arthritis with complication of left wrist extensor tendon rupture.  Now on methotrexate 15 mg p.o. weekly folic acid 1 mg daily has not noticed any particular drug side effects.  He had some swelling in his right hand and wrist that improved without any additional intervention.  Still has some restriction in range of motion at the left hand and wrist where he has surgical scars on the dorsal side of the hand.  Not causing him any particular functional limitation and not very painful.  He is asking about the folic acid supplementation as he is also on a daily multivitamin that he thinks contains 600 mcg of folic acid.   Previous HPI 04/13/22 Taylor Heath is a 62 y.o. male here for follow up for rheumatoid arthritis with complication of left wrist extensor tendon rupture and prolonged recovery from surgical repair due to localized infection.  He finished his oral doxycycline completely over the interval with no additional infectious disease follow-up scheduled.  He is working with occupational therapy on his extension he is almost able to achieve a full movement on the third and fourth finger.  No additional swelling and surgical scar is well-healed.  He has noticed some more pain in his right wrist that he attributes to  relying more heavily on the 1 hand due to the prolonged immobilization and recovery on his left side.  He also has carpal tunnel syndrome which may be contributing.  He is scheduled for umbilical hernia repair surgery on the 20th.   Previous HPI 02/12/22 Taylor Heath is a 61 y.o. male here for seropositive RA. He previously saw Dr. Cardell Peach in 2016 without active disease noted at that time persistent positive CCP Abs.  He was never started on any long-term DMARD.  He has had some degree of chronic joint pain at the hands mostly related to first [facility] joint osteoarthritis.  Otherwise also with some chronic osteoarthritis  symptoms affecting the knees and hips.  Recently with complications of tenosynovitis and hand deformity complicated by extensor tendon damage progressing towards possible rupture. Surgical repair with tenolysis was complicated recently with surgical site infection with staph aureus requiring debridement and subsequent prolonged antibiotic on linezolid then doxycycline planned for next month.  Currently symptoms are pretty much at baseline excepting the left wrist which has some stiffness and decreased range of motion with the ongoing healing dorsal surgical wound.  He has a family history his father had severe deforming rheumatoid arthritis.   Labs reviewed 10/2014 RF 106 CCP 34   Imaging reviewed MRI Left Wrist 11/26/21 IMPRESSION: 1. Moderate to high-grade fourth and fifth, moderate second and third, and mild to moderate 6th dorsal extensor compartment tenosynovitis. There is scattered debris and some thin internal septations within the tendon sheath fluid indicating mild complexity. 2. Multiple interstitial tears within the fourth and fifth extensor compartment tendons. Moderate extensor carpi ulnaris tendinosis with small interstitial tears. 3. Possible partial-thickness tear of the distal peripheral triangular fibrocartilage complex. 4. Possible partial-thickness tear of the proximal aspect of the lunotriquetral ligament. 5. Moderate to severe triscaphe, moderate thumb carpometacarpal, and moderate lunotriquetral cartilage degenerative changes. Scattered carpal subchondral cysts.   Bilateral hand xrays 03/2020 multiple carpal cysts, mild to moderate 1st [facility] joint osteoarthritis   Review of Systems  Constitutional: Negative.  Negative for fatigue.  HENT: Negative.  Negative for mouth sores and mouth dryness.   Eyes:  Positive for dryness.  Respiratory: Negative.  Negative for shortness of breath.   Cardiovascular: Negative.  Negative for chest pain and palpitations.  Gastrointestinal:  Negative.  Negative for blood in stool, constipation and diarrhea.  Endocrine: Negative.  Negative for increased urination.  Genitourinary: Negative.  Negative for involuntary urination.  Musculoskeletal:  Positive for joint pain, joint pain and muscle tenderness. Negative for gait problem, joint swelling, myalgias, muscle weakness, morning stiffness and myalgias.  Skin: Negative.  Negative for color change, rash, hair loss and sensitivity to sunlight.  Allergic/Immunologic: Negative.  Negative for susceptible to infections.  Neurological: Negative.  Negative for dizziness and headaches.  Hematological: Negative.  Negative for swollen glands.  Psychiatric/Behavioral: Negative.  Negative for depressed mood and sleep disturbance. The patient is not nervous/anxious.     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   Radiculopathy of cervical spine 04/26/2017   Total knee replacement status 01/27/2017   Primary osteoarthritis of right shoulder 12/30/2016   Primary osteoarthritis of right knee 12/30/2016  Insomnia 03/10/2012   BPH (benign prostatic hyperplasia) 12/09/2011   GERD (gastroesophageal reflux disease) 12/09/2011    Past Medical History:  Diagnosis Date   Anxiety    Arthritis    GERD (gastroesophageal reflux disease)    History of kidney stones    Pneumonia     Family History  Problem Relation Age of Onset   Rheum arthritis Father    Past Surgical History:  Procedure Laterality Date   FINGER SURGERY Left    index   HERNIA REPAIR  04/30/2022   I & D EXTREMITY Left 01/27/2022   Procedure: IRRIGATION AND DEBRIDEMENT  LEFT HAND;  Surgeon: Tarry Kos, MD;  Location: [facility] OR;  Service: Orthopedics;  Laterality: Left;   I & D EXTREMITY Left 01/29/2022   Procedure: REPEAT IRRIGATION AND DEBRIDEMENT OF HAND;  Surgeon: Tarry Kos, MD;  Location: [facility] OR;  Service: Orthopedics;  Laterality: Left;   JOINT REPLACEMENT     left hip replaced   KNEE ARTHROSCOPY W/ ACL RECONSTRUCTION Right 2009   LEG SURGERY Left    left calf   MINOR APPLICATION OF WOUND VAC  01/29/2022   Procedure: APPLICATION OF WOUND VAC;  Surgeon: Tarry Kos, MD;  Location: [facility] OR;  Service: Orthopedics;;   ORIF FINGER / THUMB FRACTURE     TOTAL KNEE ARTHROPLASTY Right 01/27/2017   Procedure: RIGHT TOTAL KNEE ARTHROPLASTY;  Surgeon: Tarry Kos, MD;  Location: [facility] OR;  Service: Orthopedics;  Laterality: Right;   Social History   Social History Narrative   Not on file    There is no immunization history on file for this patient.   Objective: Vital Signs: BP 120/80 (BP Location: Right Arm, Patient Position: Sitting, Cuff Size: Normal)   Pulse 80   Resp 16   Ht 5\' 9"  (1.753 m)   Wt 169 lb 9.6 oz (76.9 kg)   BMI 25.05 kg/m    Physical Exam Eyes:     Conjunctiva/sclera: Conjunctivae normal.  Cardiovascular:     Rate and Rhythm: Normal rate and regular rhythm.  Pulmonary:     Effort: Pulmonary effort is normal.     Breath sounds: Normal breath sounds.  Musculoskeletal:     Right lower leg: No edema.     Left lower leg: No edema.  Lymphadenopathy:     Cervical: No cervical adenopathy.  Skin:    General: Skin is warm and dry.     Findings: No rash.  Neurological:     Mental Status: He is alert.  Psychiatric:        Mood and Affect: Mood normal.      Musculoskeletal Exam:  Shoulders full ROM no tenderness or swelling Elbows full ROM no tenderness or swelling Left wrist decreased extension range of motion no swelling or tenderness to pressure postsurgical changes on dorsal side Large nontender swelling near base of the  left thumb, no tenderness to pressure no erythema or warmth Knees full ROM no tenderness or swelling  Limited musculoskeletal ultrasound demonstrates a large degree of swelling around tendons appears to be most around EPL/ECR intersection area  Investigation: No additional findings.  Imaging: No results found.  Recent Labs: Lab Results  Component Value Date   WBC 6.7 10/05/2022   HGB 14.6 10/05/2022   PLT 293 10/05/2022   NA 139 10/05/2022   K 4.6 10/05/2022   CL 104 10/05/2022   CO2 26 10/05/2022   GLUCOSE 82 10/05/2022   BUN 20 10/05/2022  CREATININE 0.87 10/05/2022   BILITOT 0.5 10/05/2022   ALKPHOS 65 01/19/2017   AST 20 10/05/2022   ALT 24 10/05/2022   PROT 6.5 10/05/2022   ALBUMIN 4.5 01/19/2017   CALCIUM 9.4 10/05/2022   GFRAA >60 01/28/2017   QFTBGOLDPLUS NEGATIVE 02/12/2022    Speciality Comments: No specialty comments available.  Procedures:  No procedures performed Allergies: Meperidine   Assessment / Plan:     Visit Diagnoses: Seropositive rheumatoid arthritis (HCC) - Plan: Sedimentation rate, C-reactive protein, methotrexate (RHEUMATREX) 2.5 MG tablet  Inflammatory symptoms appear well-controlled except for the swelling on the back of his left hand.   Rechecking sedimentation rate and CRP for disease activity monitoring.  Plan to continue methotrexate 15 mg p.o. weekly folic acid 1 mg daily.  Continue diclofenac 75 mg half a tablet also helping well for osteoarthritis.  High risk medication use - Plan to continue methotrexate 15 mg p.o. weekly folic acid 1 mg daily. Diclofenac has been taking just 1/2 tablet twice daily stable on this dose. - Plan: CBC with Differential/Platelet, COMPLETE METABOLIC PANEL WITH GFR  Checking CBC and CMP for medication monitoring on continued long-term use of methotrexate and low-dose diclofenac.  No serious interval infections.  Extensor tenosynovitis of left wrist  I am not sure if this is really inflammatory could be  consistent with distal intersection syndrome or other complication from limited wrist mobility postop.  Agree with consultation with hand specialist.  If no additional workup or procedure recommended at that time could follow-up for ultrasound-guided aspiration and injection for symptom relief and fluid analysis.  Orders: Orders Placed This Encounter  Procedures   Sedimentation rate   C-reactive protein   CBC with Differential/Platelet   COMPLETE METABOLIC PANEL WITH GFR   Meds ordered this encounter  Medications   methotrexate (RHEUMATREX) 2.5 MG tablet    Sig: Take 6 tablets (15 mg total) by mouth once a week. Caution:Chemotherapy. Protect from light.    Dispense:  78 tablet    Refill:  0     Follow-Up Instructions: Return in about 3 months (around 04/13/2023) for RA on MTX f/u 3mos.   Fuller Plan, MD  Note - This record has been created using [employer].  Chart creation errors have been sought, but may not always  have been located. Such creation errors do not reflect on  the standard of medical care.

## 2023-01-11 ENCOUNTER — Encounter: Payer: Self-pay | Admitting: Internal Medicine

## 2023-01-11 ENCOUNTER — Ambulatory Visit: Payer: Managed Care, Other (non HMO) | Attending: Internal Medicine | Admitting: Internal Medicine

## 2023-01-11 VITALS — BP 120/80 | HR 80 | Resp 16 | Ht 69.0 in | Wt 169.6 lb

## 2023-01-11 DIAGNOSIS — M65932 Unspecified synovitis and tenosynovitis, left forearm: Secondary | ICD-10-CM

## 2023-01-11 DIAGNOSIS — Z79899 Other long term (current) drug therapy: Secondary | ICD-10-CM | POA: Diagnosis not present

## 2023-01-11 DIAGNOSIS — M059 Rheumatoid arthritis with rheumatoid factor, unspecified: Secondary | ICD-10-CM | POA: Diagnosis not present

## 2023-01-11 MED ORDER — METHOTREXATE SODIUM 2.5 MG PO TABS: 15.0000 mg | ORAL_TABLET | ORAL | 0 refills | Status: DC

## 2023-01-13 LAB — COMPLETE METABOLIC PANEL WITH GFR
AG Ratio: 1.9 (calc) (ref 1.0–2.5)
ALT: 27 U/L (ref 9–46)
AST: 19 U/L (ref 10–35)
Albumin: 4.4 g/dL (ref 3.6–5.1)
Alkaline phosphatase (APISO): 71 U/L (ref 35–144)
BUN: 19 mg/dL (ref 7–25)
CO2: 26 mmol/L (ref 20–32)
Calcium: 9.3 mg/dL (ref 8.6–10.3)
Chloride: 106 mmol/L (ref 98–110)
Creat: 0.84 mg/dL (ref 0.70–1.35)
Globulin: 2.3 g/dL (ref 1.9–3.7)
Glucose, Bld: 91 mg/dL (ref 65–99)
Potassium: 4.7 mmol/L (ref 3.5–5.3)
Sodium: 141 mmol/L (ref 135–146)
Total Bilirubin: 0.5 mg/dL (ref 0.2–1.2)
Total Protein: 6.7 g/dL (ref 6.1–8.1)
eGFR: 99 mL/min/{1.73_m2} (ref >=60)

## 2023-01-13 LAB — CBC WITH DIFFERENTIAL/PLATELET
Absolute Lymphocytes: 1780 {cells}/uL (ref 850–3900)
Absolute Monocytes: 439 {cells}/uL (ref 200–950)
Basophils Absolute: 17 {cells}/uL (ref 0–200)
Basophils Relative: 0.2 %
Eosinophils Absolute: 206 {cells}/uL (ref 15–500)
Eosinophils Relative: 2.4 %
HCT: 45.7 % (ref 38.5–50.0)
Hemoglobin: 15.2 g/dL (ref 13.2–17.1)
MCH: 32.1 pg (ref 27.0–33.0)
MCHC: 33.3 g/dL (ref 32.0–36.0)
MCV: 96.6 fL (ref 80.0–100.0)
MPV: 9.4 fL (ref 7.5–12.5)
Monocytes Relative: 5.1 %
Neutro Abs: 6158 {cells}/uL (ref 1500–7800)
Neutrophils Relative %: 71.6 %
Platelets: 278 10*3/uL (ref 140–400)
RBC: 4.73 10*6/uL (ref 4.20–5.80)
RDW: 12.9 % (ref 11.0–15.0)
Total Lymphocyte: 20.7 %
WBC: 8.6 10*3/uL (ref 3.8–10.8)

## 2023-01-13 LAB — C-REACTIVE PROTEIN: CRP: <3 mg/L (ref <8.0)

## 2023-01-13 LAB — SEDIMENTATION RATE: Sed Rate: 2 mm/h (ref 0–20)

## 2023-01-24 ENCOUNTER — Ambulatory Visit: Payer: Managed Care, Other (non HMO) | Admitting: Orthopedic Surgery

## 2023-02-20 ENCOUNTER — Other Ambulatory Visit: Payer: Self-pay | Admitting: Internal Medicine

## 2023-02-20 DIAGNOSIS — M059 Rheumatoid arthritis with rheumatoid factor, unspecified: Secondary | ICD-10-CM

## 2023-03-05 ENCOUNTER — Other Ambulatory Visit: Payer: Self-pay | Admitting: Internal Medicine

## 2023-03-05 DIAGNOSIS — M059 Rheumatoid arthritis with rheumatoid factor, unspecified: Secondary | ICD-10-CM

## 2023-03-07 NOTE — Telephone Encounter (Signed)
 Last Fill: 10/05/2022  Labs: 01/11/2023 WNL  Next Visit: 04/13/2023  Last Visit: 01/11/2023  DX: Seropositive rheumatoid arthritis   Current Dose per office note 01/11/2023:  Continue diclofenac 75 mg half a tablet   Okay to refill Diclofenac?

## 2023-03-10 ENCOUNTER — Other Ambulatory Visit (INDEPENDENT_AMBULATORY_CARE_PROVIDER_SITE_OTHER): Payer: Self-pay

## 2023-03-10 ENCOUNTER — Ambulatory Visit: Payer: Commercial Managed Care - HMO | Admitting: Orthopaedic Surgery

## 2023-03-10 ENCOUNTER — Encounter: Payer: Self-pay | Admitting: Orthopaedic Surgery

## 2023-03-10 DIAGNOSIS — M25551 Pain in right hip: Secondary | ICD-10-CM

## 2023-03-10 NOTE — Progress Notes (Signed)
 Office Visit Note   Patient: Taylor Heath           Date of Birth: 22-Oct-1960           MRN: [id number] Visit Date: 03/10/2023              Requested by: Bertell Satterfield, MD [address] [place],  [place] [address] PCP: Bertell Satterfield, MD   Assessment & Plan: Visit Diagnoses:  1. Pain in right hip     Plan: Taylor Heath a very pleasant 63 year old gentleman with advanced right hip DJD.  The previous cortisone injection worked really well which was 5 years ago.  We will repeat this with Dr. Burnetta.  He will follow-up if symptoms do not improve from the injection.  X-rays do show bone-on-bone changes already.  Follow-Up Instructions: No follow-ups on file.   Orders:  Orders Placed This Encounter  Procedures   XR HIP UNILAT W OR W/O PELVIS 2-3 VIEWS RIGHT   AMB referral to sports medicine   No orders of the defined types were placed in this encounter.     Procedures: No procedures performed   Clinical Data: No additional findings.   Subjective: Chief Complaint  Patient presents with   Right Hip - Pain    HPI Taylor Heath returns today for follow-up evaluation of right hip DJD.  He is experiencing symptoms from his hip again.  He had an injection 5 years ago by Dr. Hughie which worked really well. Review of Systems   Objective: Vital Signs: There were no vitals taken for this visit.  Physical Exam  Ortho Exam Examination of the right hip shows pain with range of motion. Specialty Comments:  No specialty comments available.  Imaging: XR HIP UNILAT W OR W/O PELVIS 2-3 VIEWS RIGHT Result Date: 03/10/2023 X-rays of the right hip show advanced degenerative joint disease of the right hip joint with bone-on-bone joint space narrowing.  Cam deformity of the femoral head.    PMFS History: Patient Active Problem List   Diagnosis Date Noted   Umbilical hernia without obstruction and without gangrene 04/07/2022   Rectus diastasis 04/07/2022   Seropositive  rheumatoid arthritis (HCC) 02/12/2022   High risk medication use 02/12/2022   Abscess of dorsum of left hand 01/26/2022   Extensor tenosynovitis of left wrist 12/01/2021   Primary osteoarthritis of right wrist 04/26/2017   Radiculopathy of cervical spine 04/26/2017   Total knee replacement status 01/27/2017   Primary osteoarthritis of right shoulder 12/30/2016   Primary osteoarthritis of right knee 12/30/2016   Insomnia 03/10/2012   BPH (benign prostatic hyperplasia) 12/09/2011   GERD (gastroesophageal reflux disease) 12/09/2011   Past Medical History:  Diagnosis Date   Anxiety    Arthritis    GERD (gastroesophageal reflux disease)    History of kidney stones    Pneumonia     Family History  Problem Relation Age of Onset   Rheum arthritis Father     Past Surgical History:  Procedure Laterality Date   FINGER SURGERY Left    index   HERNIA REPAIR  04/30/2022   I & D EXTREMITY Left 01/27/2022   Procedure: IRRIGATION AND DEBRIDEMENT LEFT HAND;  Surgeon: Jerri Kay HERO, MD;  Location: [facility] OR;  Service: Orthopedics;  Laterality: Left;   I & D EXTREMITY Left 01/29/2022   Procedure: REPEAT IRRIGATION AND DEBRIDEMENT OF HAND;  Surgeon: Jerri Kay HERO, MD;  Location: [facility] OR;  Service: Orthopedics;  Laterality: Left;   JOINT REPLACEMENT  left hip replaced   KNEE ARTHROSCOPY W/ ACL RECONSTRUCTION Right 2009   LEG SURGERY Left    left calf   MINOR APPLICATION OF WOUND VAC  01/29/2022   Procedure: APPLICATION OF WOUND VAC;  Surgeon: Jerri Kay HERO, MD;  Location: [facility] OR;  Service: Orthopedics;;   ORIF FINGER / THUMB FRACTURE     TOTAL KNEE ARTHROPLASTY Right 01/27/2017   Procedure: RIGHT TOTAL KNEE ARTHROPLASTY;  Surgeon: Jerri Kay HERO, MD;  Location: [facility] OR;  Service: Orthopedics;  Laterality: Right;   Social History   Occupational History   Not on file  Tobacco Use   Smoking status: Former    Current packs/day: 0.00    Types: Cigarettes    Quit date: 04/21/2010    Years since  quitting: 12.8    Passive exposure: Never   Smokeless tobacco: Never  Vaping Use   Vaping status: Never Used  Substance and Sexual Activity   Alcohol use: No   Drug use: No   Sexual activity: Not on file

## 2023-03-11 ENCOUNTER — Ambulatory Visit (INDEPENDENT_AMBULATORY_CARE_PROVIDER_SITE_OTHER): Payer: Commercial Managed Care - HMO | Admitting: Sports Medicine

## 2023-03-11 ENCOUNTER — Encounter: Payer: Self-pay | Admitting: Sports Medicine

## 2023-03-11 ENCOUNTER — Other Ambulatory Visit: Payer: Self-pay

## 2023-03-11 DIAGNOSIS — M1611 Unilateral primary osteoarthritis, right hip: Secondary | ICD-10-CM | POA: Diagnosis not present

## 2023-03-11 DIAGNOSIS — M25551 Pain in right hip: Secondary | ICD-10-CM | POA: Diagnosis not present

## 2023-03-11 MED ORDER — LIDOCAINE HCL 1 % IJ SOLN
4.0000 mL | INTRAMUSCULAR | Status: AC | PRN
  Administered 2023-03-11: 4 mL

## 2023-03-11 MED ORDER — METHYLPREDNISOLONE ACETATE 40 MG/ML IJ SUSP
80.0000 mg | INTRAMUSCULAR | Status: AC | PRN
  Administered 2023-03-11: 80 mg via INTRA_ARTICULAR

## 2023-03-11 NOTE — Progress Notes (Signed)
   Procedure Note  Patient: Taylor Heath             Date of Birth: 02-Feb-1961           MRN: [id number]             Visit Date: 03/11/2023  Procedures: Visit Diagnoses:  1. Unilateral primary osteoarthritis, right hip   2. Pain in right hip    Large Joint Inj: R hip joint on 03/11/2023 8:36 AM Indications: pain Details: 22 G 3.5 in needle, ultrasound-guided anterior approach Medications: 4 mL lidocaine  1 %; 80 mg methylPREDNISolone  acetate 40 MG/ML Outcome: tolerated well, no immediate complications  Procedure: US -guided intra-articular hip injection, Right After discussion on risks/benefits/indications and informed verbal consent was obtained, a timeout was performed. Patient was lying supine on exam table. The hip was cleaned with betadine  and alcohol swabs. Then utilizing ultrasound guidance, the patient's femoral head and neck junction was identified and subsequently injected with 4:2 lidocaine :depomedrol via an in-plane approach with ultrasound visualization of the injectate administered into the hip joint. Patient tolerated procedure well without immediate complications.  Procedure, treatment alternatives, risks and benefits explained, specific risks discussed. Consent was given by the patient. Immediately prior to procedure a time out was called to verify the correct patient, procedure, equipment, support staff and site/side marked as required. Patient was prepped and draped in the usual sterile fashion.    - follow-up with Dr. Jerri as indicated; I am happy to see them as needed  Lonell Sprang, DO Primary Care Sports Medicine Physician  [facility] - Orthopedics  This note was dictated using Dragon naturally speaking software and may contain errors in syntax, spelling, or content which have not been identified prior to signing this note.

## 2023-03-30 ENCOUNTER — Telehealth: Payer: Self-pay

## 2023-03-30 NOTE — Telephone Encounter (Signed)
Patient contacted the office and left a message stating he has an appointment on 04/13/2023. Patient states he is having a left hand out patient procedure on 04/04/2023 to clean out a sleeve and he will be put to sleep. Patient inquires when to stop his methotrexate. Spoke with Dr. Dimple Casey. Advised the patient per Dr. Dimple Casey, the patient should stop the medication one week prior to surgery and cannot start the medication until two weeks after surgery. Advised the patient he should also get clearance to resume medication from his surgeon to ensure no infection. Patient verbalized understanding.

## 2023-03-30 NOTE — Progress Notes (Signed)
 Office Visit Note  Patient: Taylor Heath             Date of Birth: 06/18/1960           MRN: [id number]             PCP: Elfredia Nevins, MD Referring: Elfredia Nevins, MD Visit Date: 04/13/2023   Subjective:  Follow-up (Patient states had a surgery on his left hand last Monday and is still off his methotrexate. Patient states he is currently on an antibiotic and will finish it Monday. )   Discussed the use of AI scribe software for clinical note transcription with the patient, who gave verbal consent to proceed.  History of Present Illness   Taylor Heath is a 63 y.o. male here for follow up for seropositive rheumatoid arthritis with complication of left wrist extensor tendon rupture. Now on methotrexate 15 mg p.o. weekly folic acid 1 mg daily but has held medication for weeks for surgery on 04/04/23.    He is experiencing postoperative swelling in his forearm following recent tendon surgery. The swelling is not severe, as he can still wear his ring, and there is no redness or pain. He has been massaging his fingers to aid circulation and is cautious not to overwork the area. Certain movements cause pain, especially those that put tension on the tendon area where surgery was performed.  He has been off methotrexate since before his surgery and is inquiring about when to resume it. He is currently completing a course of amoxicillin, which will finish on Sunday.He started this due to right sided jaw and facial pain he is not sure if this was from his tooth or possible sinus pain. No current pain in his tooth, which he initially thought was the source of his discomfort, and he can now chew without issue.   He recently returned from a trip to [facility], where he played golf extensively, leading to significant aches and pains. He describes feeling lethargic and experiencing aches and pains, which he attributes to a possible mild flu. No congestion and states that his sinus symptoms  have resolved.     Previous HPI 01/11/2023 Taylor Heath is a 63 y.o. male here for follow up for seropositive rheumatoid arthritis with complication of left wrist extensor tendon rupture. Now on methotrexate 15 mg p.o. weekly folic acid 1 mg daily.  He has also been taking diclofenac 75 mg half a tablet twice daily.  He has not experienced any major change in joint pain or mobility.  Left wrist is still limited in extension.  He has been getting increased swelling near the base of the thumb not associated with any pain redness or warmth in that area.  He followed up with Dr. Roda Shutters did not do any new procedure did recommend considering hand surgical specialist consultation.  He is concerned about this affecting his left wrist and thumb movement worse which would limit golfing.   10/05/2022 Taylor Heath is a 63 y.o. male here for follow up for seropositive rheumatoid arthritis with complication of left wrist extensor tendon rupture. Now on methotrexate 15 mg p.o. weekly folic acid 1 mg daily.  He has also been taking diclofenac one half of a 75 mg tablet twice daily.  Feels arthritis symptoms are well-controlled not having significant morning joint pain or stiffness.  He has been staying physically active usually with golf 2 to 3 days/week feels his leg strength and mobility is a little bit  improved.  Still has small amount of swelling around the left wrist and thumb but decreased compared to our last visit.   06/30/2022  Taylor Heath is a 63 y.o. male here for follow up for seropositive rheumatoid arthritis with complication of left wrist extensor tendon rupture.  Now on methotrexate 15 mg p.o. weekly folic acid 1 mg daily has not noticed any particular drug side effects.  He had some swelling in his right hand and wrist that improved without any additional intervention.  Still has some restriction in range of motion at the left hand and wrist where he has surgical scars on the dorsal side of the  hand.  Not causing him any particular functional limitation and not very painful.  He is asking about the folic acid supplementation as he is also on a daily multivitamin that he thinks contains 600 mcg of folic acid.   04/13/22 Taylor Heath is a 63 y.o. male here for follow up for rheumatoid arthritis with complication of left wrist extensor tendon rupture and prolonged recovery from surgical repair due to localized infection.  He finished his oral doxycycline completely over the interval with no additional infectious disease follow-up scheduled.  He is working with occupational therapy on his extension he is almost able to achieve a full movement on the third and fourth finger.  No additional swelling and surgical scar is well-healed.  He has noticed some more pain in his right wrist that he attributes to relying more heavily on the 1 hand due to the prolonged immobilization and recovery on his left side.  He also has carpal tunnel syndrome which may be contributing.  He is scheduled for umbilical hernia repair surgery on the 20th.   02/12/22 Taylor Heath is a 63 y.o. male here for seropositive RA. He previously saw Dr. Cardell Peach in 2016 without active disease noted at that time persistent positive CCP Abs.  He was never started on any long-term DMARD.  He has had some degree of chronic joint pain at the hands mostly related to first [facility] joint osteoarthritis.  Otherwise also with some chronic osteoarthritis symptoms affecting the knees and hips.  Recently with complications of tenosynovitis and hand deformity complicated by extensor tendon damage progressing towards possible rupture. Surgical repair with tenolysis was complicated recently with surgical site infection with staph aureus requiring debridement and subsequent prolonged antibiotic on linezolid then doxycycline planned for next month.  Currently symptoms are pretty much at baseline excepting the left wrist which has some stiffness and decreased  range of motion with the ongoing healing dorsal surgical wound.  He has a family history his father had severe deforming rheumatoid arthritis.   Labs reviewed 10/2014 RF 106 CCP 34   Imaging reviewed MRI Left Wrist 11/26/21 IMPRESSION: 1. Moderate to high-grade fourth and fifth, moderate second and third, and mild to moderate 6th dorsal extensor compartment tenosynovitis. There is scattered debris and some thin internal septations within the tendon sheath fluid indicating mild complexity. 2. Multiple interstitial tears within the fourth and fifth extensor compartment tendons. Moderate extensor carpi ulnaris tendinosis with small interstitial tears. 3. Possible partial-thickness tear of the distal peripheral triangular fibrocartilage complex. 4. Possible partial-thickness tear of the proximal aspect of the lunotriquetral ligament. 5. Moderate to severe triscaphe, moderate thumb carpometacarpal, and moderate lunotriquetral cartilage degenerative changes. Scattered carpal subchondral cysts.   Bilateral hand xrays 03/2020 multiple carpal cysts, mild to moderate 1st [facility] joint osteoarthritis   Review of Systems  Constitutional:  Positive for fatigue.  HENT:  Negative for mouth sores and mouth dryness.   Eyes:  Positive for dryness.  Respiratory:  Negative for shortness of breath.   Cardiovascular:  Negative for chest pain and palpitations.  Gastrointestinal:  Negative for blood in stool, constipation and diarrhea.  Endocrine: Negative for increased urination.  Genitourinary:  Negative for involuntary urination.  Musculoskeletal:  Positive for joint pain, joint pain, myalgias, morning stiffness and myalgias. Negative for gait problem, joint swelling, muscle weakness and muscle tenderness.  Skin:  Negative for color change, Heath, hair loss and sensitivity to sunlight.  Allergic/Immunologic: Negative for susceptible to infections.  Neurological:  Negative for dizziness and headaches.   Hematological:  Negative for swollen glands.  Psychiatric/Behavioral:  Negative for depressed mood and sleep disturbance. The patient is not nervous/anxious.     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   Radiculopathy of cervical spine 04/26/2017   Total knee replacement status 01/27/2017   Primary osteoarthritis of right shoulder 12/30/2016   Primary osteoarthritis of right knee 12/30/2016   Insomnia 03/10/2012   BPH (benign prostatic hyperplasia) 12/09/2011   GERD (gastroesophageal reflux disease) 12/09/2011    Past Medical History:  Diagnosis Date   Anxiety    Arthritis    GERD (gastroesophageal reflux disease)    History of kidney stones    Pneumonia     Family History  Problem Relation Age of Onset   Rheum arthritis Father    Past Surgical History:  Procedure Laterality Date   FINGER SURGERY Left    index   HAND SURGERY Left 04/04/2023   HERNIA REPAIR  04/30/2022   I & D EXTREMITY Left 01/27/2022   Procedure: IRRIGATION AND DEBRIDEMENT LEFT HAND;  Surgeon: Tarry Kos, MD;  Location: [facility] OR;  Service: Orthopedics;  Laterality: Left;   I & D EXTREMITY Left 01/29/2022   Procedure: REPEAT IRRIGATION AND DEBRIDEMENT OF HAND;  Surgeon: Tarry Kos, MD;  Location: [facility] OR;  Service: Orthopedics;  Laterality: Left;   JOINT REPLACEMENT     left hip replaced   KNEE ARTHROSCOPY W/ ACL RECONSTRUCTION Right 2009   LEG SURGERY Left    left calf   MINOR APPLICATION OF WOUND VAC  01/29/2022   Procedure: APPLICATION OF WOUND VAC;  Surgeon: Tarry Kos, MD;  Location: [facility] OR;  Service: Orthopedics;;   ORIF FINGER /  THUMB FRACTURE     TOTAL KNEE ARTHROPLASTY Right 01/27/2017   Procedure: RIGHT TOTAL KNEE ARTHROPLASTY;  Surgeon: Tarry Kos, MD;  Location: [facility] OR;  Service: Orthopedics;  Laterality: Right;   Social History   Social History Narrative   Not on file    There is no immunization history on file for this patient.   Objective: Vital Signs: BP (!) 140/86 (BP Location: Right Arm, Patient Position: Sitting, Cuff Size: Large)   Pulse 73   Resp 14   Ht 5' 9.5" (1.765 m)   Wt 167 lb (75.8 kg)   BMI 24.31 kg/m    Physical Exam Cardiovascular:     Rate and Rhythm: Normal rate and regular rhythm.  Pulmonary:     Effort:  Pulmonary effort is normal.     Breath sounds: Normal breath sounds.  Skin:    General: Skin is warm and dry.  Neurological:     Mental Status: He is alert.  Psychiatric:        Mood and Affect: Mood normal.      Musculoskeletal Exam:  Shoulders full ROM no tenderness or swelling Elbows full ROM no tenderness or swelling Left wrist in hard brace, pitting edema in distal hand and fingers with no discoloration and finger movement is normal Knees full ROM no tenderness or swelling Ankles full ROM no tenderness or swelling   Investigation: No additional findings.  Imaging: No results found.   Recent Labs: Lab Results  Component Value Date   WBC 8.6 01/11/2023   HGB 15.2 01/11/2023   PLT 278 01/11/2023   NA 141 01/11/2023   K 4.7 01/11/2023   CL 106 01/11/2023   CO2 26 01/11/2023   GLUCOSE 91 01/11/2023   BUN 19 01/11/2023   CREATININE 0.84 01/11/2023   BILITOT 0.5 01/11/2023   ALKPHOS 65 01/19/2017   AST 19 01/11/2023   ALT 27 01/11/2023   PROT 6.7 01/11/2023   ALBUMIN 4.5 01/19/2017   CALCIUM 9.3 01/11/2023   GFRAA >60 01/28/2017   QFTBGOLDPLUS NEGATIVE 02/12/2022    Speciality Comments: No specialty comments available.  Procedures:  No procedures performed Allergies: Meperidine   Assessment / Plan:     Visit Diagnoses: Seropositive  rheumatoid arthritis (HCC) - Plan: Sedimentation rate, methotrexate (RHEUMATREX) 2.5 MG tablet No significant inflammatory arthritis elsewhere besides the left hand and wrist. No flare after holding methotrexate 3 weeks. More OA related pain after long days golfing. Doing well on low dose diclofenac. -Continue diclofenac 1/2 tablet 1-2 daily -Plan to resume Methotrexate approximately two weeks postoperatively, assuming no complications noted at follow up Friday -Recheck ESR for monitoring in about 5 weeks future orders placed  High risk medication use - methotrexate 15 mg p.o. weekly folic acid 1 mg daily.  Continue diclofenac 75 mg half a tablet also helping well for osteoarthritis. - Plan: CBC with Differential/Platelet, COMPLETE METABOLIC PANEL WITH GFR Methotrexate was held prior to surgery. No current signs of active inflammation or infection. Tolerating medication without side effect and no severe interval infection may have had dental infection treated with amoxicillin. -Check labs approximately five weeks after resuming Methotrexate future orders placed for CBC, CMP  Extensor tenosynovitis of left wrist Postoperative hand swelling Mild edema noted in the hand following recent surgery. Likely due to a combination of inflammation from the surgery and compression from the cast. No signs of infection or abnormal wound healing. Upcoming surgery f/u on Friday 2/14 -Continue with lymphatic massage to help reduce swelling. -Keep hand elevated as much as possible to promote venous return.   Orders: Orders Placed This Encounter  Procedures   Sedimentation rate   CBC with Differential/Platelet   COMPLETE METABOLIC PANEL WITH GFR   Meds ordered this encounter  Medications   methotrexate (RHEUMATREX) 2.5 MG tablet    Sig: Take 6 tablets (15 mg total) by mouth once a week. Caution:Chemotherapy. Protect from light.    Dispense:  78 tablet    Refill:  0     Follow-Up Instructions: Return in  about 4 months (around 08/11/2023) for RA MTX f/u 4-46mos.   Fuller Plan, MD  Note - This record has been created using [employer].  Chart creation errors have been sought, but may not always  have  been located. Such creation errors do not reflect on  the standard of medical care.

## 2023-04-04 HISTORY — PX: HAND SURGERY: SHX662

## 2023-04-13 ENCOUNTER — Ambulatory Visit: Payer: Commercial Managed Care - HMO | Attending: Internal Medicine | Admitting: Internal Medicine

## 2023-04-13 ENCOUNTER — Encounter: Payer: Self-pay | Admitting: Internal Medicine

## 2023-04-13 VITALS — BP 140/86 | HR 73 | Resp 14 | Ht 69.5 in | Wt 167.0 lb

## 2023-04-13 DIAGNOSIS — M059 Rheumatoid arthritis with rheumatoid factor, unspecified: Secondary | ICD-10-CM | POA: Diagnosis not present

## 2023-04-13 DIAGNOSIS — M65932 Unspecified synovitis and tenosynovitis, left forearm: Secondary | ICD-10-CM

## 2023-04-13 DIAGNOSIS — Z79899 Other long term (current) drug therapy: Secondary | ICD-10-CM

## 2023-04-13 MED ORDER — METHOTREXATE SODIUM 2.5 MG PO TABS
15.0000 mg | ORAL_TABLET | ORAL | 0 refills | Status: DC
Start: 2023-04-20 — End: 2023-07-13

## 2023-05-11 ENCOUNTER — Other Ambulatory Visit: Payer: Self-pay | Admitting: Internal Medicine

## 2023-05-11 DIAGNOSIS — M059 Rheumatoid arthritis with rheumatoid factor, unspecified: Secondary | ICD-10-CM

## 2023-05-18 ENCOUNTER — Ambulatory Visit: Admitting: Orthopaedic Surgery

## 2023-05-18 DIAGNOSIS — M1611 Unilateral primary osteoarthritis, right hip: Secondary | ICD-10-CM

## 2023-05-18 MED ORDER — HYDROCODONE-ACETAMINOPHEN 5-325 MG PO TABS: 1.0000 | ORAL_TABLET | Freq: Every day | ORAL | 0 refills | Status: DC | PRN

## 2023-05-18 NOTE — Progress Notes (Signed)
 Office Visit Note   Patient: Taylor Heath           Date of Birth: 10/04/1960           MRN: [id number] Visit Date: 05/18/2023              Requested by: Elfredia Nevins, MD [address] [place],  [place] [address] PCP: Elfredia Nevins, MD   Assessment & Plan: Visit Diagnoses:  1. Primary osteoarthritis of right hip     Plan: Impression is severe right hip degenerative joint disease secondary to Osteoarthritis.  Imaging shows bone on bone joint space narrowing.  At this point, conservative treatments fail to provide any significant relief and the pain is severely affecting ADLs and quality of life.  Based on treatment options, the patient has elected to move forward with a hip replacement.  We have discussed the surgical risks that include but are not limited to infection, DVT, leg length discrepancy, numbness, tingling, incomplete relief of pain.  Recovery and prognosis were also reviewed.    Eunice Blase will call him to confirm surgery date.  This will be for sometime in early May.  He has a international trip towards the end of April.  Also had a cortisone injection in January.  Current anticoagulants: No antithrombotic Postop anticoagulation: Aspirin 81 mg Diabetic: No  Prior DVT/PE: No Tobacco use: No Clearances needed for surgery: None Anticipate discharge dispo: home   Follow-Up Instructions: No follow-ups on file.   Orders:  No orders of the defined types were placed in this encounter.  Meds ordered this encounter  Medications   HYDROcodone-acetaminophen (NORCO/VICODIN) 5-325 MG tablet    Sig: Take 1 tablet by mouth daily as needed for moderate pain (pain score 4-6).    Dispense:  10 tablet    Refill:  0      Procedures: No procedures performed   Clinical Data: No additional findings.   Subjective: Chief Complaint  Patient presents with   Right Hip - Pain    HPI Mr. Wyka returns today for follow-up evaluation of right hip pain.  He has fairly  advanced DJD. Review of Systems   Objective: Vital Signs: There were no vitals taken for this visit.  Physical Exam  Ortho Exam Right hip exam is unchanged from prior visit. Specialty Comments:  No specialty comments available.  Imaging: No results found.   PMFS History: Patient Active Problem List   Diagnosis Date Noted   Umbilical hernia without obstruction and without gangrene 04/07/2022   Rectus diastasis 04/07/2022   Seropositive rheumatoid arthritis (HCC) 02/12/2022   High risk medication use 02/12/2022   Abscess of dorsum of left hand 01/26/2022   Extensor tenosynovitis of left wrist 12/01/2021   Primary osteoarthritis of right wrist 04/26/2017   Radiculopathy of cervical spine 04/26/2017   Total knee replacement status 01/27/2017   Primary osteoarthritis of right shoulder 12/30/2016   Primary osteoarthritis of right knee 12/30/2016   Insomnia 03/10/2012   BPH (benign prostatic hyperplasia) 12/09/2011   GERD (gastroesophageal reflux disease) 12/09/2011   Past Medical History:  Diagnosis Date   Anxiety    Arthritis    GERD (gastroesophageal reflux disease)    History of kidney stones    Pneumonia     Family History  Problem Relation Age of Onset   Rheum arthritis Father     Past Surgical History:  Procedure Laterality Date   FINGER SURGERY Left    index   HAND SURGERY Left 04/04/2023  HERNIA REPAIR  04/30/2022   I & D EXTREMITY Left 01/27/2022   Procedure: IRRIGATION AND DEBRIDEMENT LEFT HAND;  Surgeon: Tarry Kos, MD;  Location: [facility] OR;  Service: Orthopedics;  Laterality: Left;   I & D EXTREMITY Left 01/29/2022   Procedure: REPEAT IRRIGATION AND DEBRIDEMENT OF HAND;  Surgeon: Tarry Kos, MD;  Location: [facility] OR;  Service: Orthopedics;  Laterality: Left;   JOINT REPLACEMENT     left hip replaced   KNEE ARTHROSCOPY W/ ACL RECONSTRUCTION Right 2009   LEG SURGERY Left    left calf   MINOR APPLICATION OF WOUND VAC  01/29/2022   Procedure:  APPLICATION OF WOUND VAC;  Surgeon: Tarry Kos, MD;  Location: [facility] OR;  Service: Orthopedics;;   ORIF FINGER / THUMB FRACTURE     TOTAL KNEE ARTHROPLASTY Right 01/27/2017   Procedure: RIGHT TOTAL KNEE ARTHROPLASTY;  Surgeon: Tarry Kos, MD;  Location: [facility] OR;  Service: Orthopedics;  Laterality: Right;   Social History   Occupational History   Not on file  Tobacco Use   Smoking status: Former    Current packs/day: 0.00    Types: Cigarettes    Quit date: 04/21/2010    Years since quitting: 13.0    Passive exposure: Never   Smokeless tobacco: Never  Vaping Use   Vaping status: Never Used  Substance and Sexual Activity   Alcohol use: No   Drug use: No   Sexual activity: Not on file

## 2023-05-19 ENCOUNTER — Other Ambulatory Visit: Payer: Self-pay | Admitting: *Deleted

## 2023-05-19 DIAGNOSIS — M059 Rheumatoid arthritis with rheumatoid factor, unspecified: Secondary | ICD-10-CM

## 2023-05-19 DIAGNOSIS — Z79899 Other long term (current) drug therapy: Secondary | ICD-10-CM

## 2023-05-20 LAB — CBC WITH DIFFERENTIAL/PLATELET
Absolute Lymphocytes: 1730 {cells}/uL (ref 850–3900)
Absolute Monocytes: 347 {cells}/uL (ref 200–950)
Basophils Absolute: 22 {cells}/uL (ref 0–200)
Basophils Relative: 0.4 %
Eosinophils Absolute: 112 {cells}/uL (ref 15–500)
Eosinophils Relative: 2 %
HCT: 43.1 % (ref 38.5–50.0)
Hemoglobin: 14.8 g/dL (ref 13.2–17.1)
MCH: 32.7 pg (ref 27.0–33.0)
MCHC: 34.3 g/dL (ref 32.0–36.0)
MCV: 95.1 fL (ref 80.0–100.0)
MPV: 9.3 fL (ref 7.5–12.5)
Monocytes Relative: 6.2 %
Neutro Abs: 3388 {cells}/uL (ref 1500–7800)
Neutrophils Relative %: 60.5 %
Platelets: 318 10*3/uL (ref 140–400)
RBC: 4.53 10*6/uL (ref 4.20–5.80)
RDW: 12.6 % (ref 11.0–15.0)
Total Lymphocyte: 30.9 %
WBC: 5.6 10*3/uL (ref 3.8–10.8)

## 2023-05-20 LAB — COMPREHENSIVE METABOLIC PANEL
AG Ratio: 2 (calc) (ref 1.0–2.5)
ALT: 23 U/L (ref 9–46)
AST: 19 U/L (ref 10–35)
Albumin: 4.7 g/dL (ref 3.6–5.1)
Alkaline phosphatase (APISO): 67 U/L (ref 35–144)
BUN: 12 mg/dL (ref 7–25)
CO2: 28 mmol/L (ref 20–32)
Calcium: 9.6 mg/dL (ref 8.6–10.3)
Chloride: 104 mmol/L (ref 98–110)
Creat: 0.81 mg/dL (ref 0.70–1.35)
Globulin: 2.4 g/dL (ref 1.9–3.7)
Glucose, Bld: 88 mg/dL (ref 65–99)
Potassium: 4.5 mmol/L (ref 3.5–5.3)
Sodium: 141 mmol/L (ref 135–146)
Total Bilirubin: 0.6 mg/dL (ref 0.2–1.2)
Total Protein: 7.1 g/dL (ref 6.1–8.1)
eGFR: 100 mL/min/{1.73_m2} (ref >=60)

## 2023-05-20 LAB — SEDIMENTATION RATE: Sed Rate: 6 mm/h (ref 0–20)

## 2023-05-20 NOTE — Progress Notes (Signed)
 Sed rate of 6 is normal. His blood count and kidney and liver function are all normal.

## 2023-05-25 ENCOUNTER — Encounter (INDEPENDENT_AMBULATORY_CARE_PROVIDER_SITE_OTHER): Payer: Self-pay | Admitting: *Deleted

## 2023-05-28 ENCOUNTER — Other Ambulatory Visit: Payer: Self-pay | Admitting: Internal Medicine

## 2023-05-28 DIAGNOSIS — M059 Rheumatoid arthritis with rheumatoid factor, unspecified: Secondary | ICD-10-CM

## 2023-05-30 NOTE — Telephone Encounter (Signed)
 Last Fill: 03/08/2023  Labs: 05/19/2023 Sed rate of 6 is normal. His blood count and kidney and liver function are all normal.   Next Visit: 08/11/2023  Last Visit: 04/13/2023  DX: Seropositive rheumatoid arthritis (HCC)   Current Dose per office note on 04/13/2023: diclofenac 1/2 tablet 1-2 daily   Okay to refill Diclofenac?

## 2023-07-10 ENCOUNTER — Other Ambulatory Visit: Payer: Self-pay | Admitting: Internal Medicine

## 2023-07-10 DIAGNOSIS — M059 Rheumatoid arthritis with rheumatoid factor, unspecified: Secondary | ICD-10-CM

## 2023-07-11 NOTE — Telephone Encounter (Signed)
 Last Fill: 04/20/2023  Labs: 05/19/2023 Sed rate of 6 is normal. His blood count and kidney and liver function are all normal.   Next Visit: 08/11/2023  Last Visit: 04/13/2023  DX: Seropositive rheumatoid arthritis   Current Dose per office note 04/13/2023: methotrexate  15 mg p.o. weekly   Okay to refill Methotrexate ?

## 2023-07-24 ENCOUNTER — Other Ambulatory Visit: Payer: Self-pay | Admitting: Internal Medicine

## 2023-07-24 DIAGNOSIS — M059 Rheumatoid arthritis with rheumatoid factor, unspecified: Secondary | ICD-10-CM

## 2023-07-26 NOTE — Telephone Encounter (Signed)
 Last Fill: 06/30/2022  Next Visit: 08/11/2023  Last Visit: 04/13/2023  Dx: Seropositive rheumatoid arthritis (HCC)   Current Dose per office note on 04/13/2023: folic acid  1 mg daily   Okay to refill Folic Acid ?

## 2023-07-28 NOTE — Progress Notes (Deleted)
 Office Visit Note  Patient: Taylor Heath             Date of Birth: 1961/01/14           MRN: [id number]             PCP: Kathyleen Parkins, MD Referring: Kathyleen Parkins, MD Visit Date: 08/11/2023   Subjective:  No chief complaint on file.   History of Present Illness: Taylor Heath is a 63 y.o. male here for follow up for seropositive rheumatoid arthritis with complication of left wrist extensor tendon rupture. Now on methotrexate  15 mg p.o. weekly folic acid  1 mg daily but has held medication for weeks for surgery on 04/04/23.    Previous HPI 04/13/2023 Taylor Heath is a 63 y.o. male here for follow up for seropositive rheumatoid arthritis with complication of left wrist extensor tendon rupture. Now on methotrexate  15 mg p.o. weekly folic acid  1 mg daily but has held medication for weeks for surgery on 04/04/23.     He is experiencing postoperative swelling in his forearm following recent tendon surgery. The swelling is not severe, as he can still wear his ring, and there is no redness or pain. He has been massaging his fingers to aid circulation and is cautious not to overwork the area. Certain movements cause pain, especially those that put tension on the tendon area where surgery was performed.   He has been off methotrexate  since before his surgery and is inquiring about when to resume it. He is currently completing a course of amoxicillin , which will finish on Sunday.He started this due to right sided jaw and facial pain he is not sure if this was from his tooth or possible sinus pain. No current pain in his tooth, which he initially thought was the source of his discomfort, and he can now chew without issue.    He recently returned from a trip to [facility], where he played golf extensively, leading to significant aches and pains. He describes feeling lethargic and experiencing aches and pains, which he attributes to a possible mild flu. No congestion and states that his  sinus symptoms have resolved.        Previous HPI 01/11/2023 Taylor Heath is a 63 y.o. male here for follow up for seropositive rheumatoid arthritis with complication of left wrist extensor tendon rupture. Now on methotrexate  15 mg p.o. weekly folic acid  1 mg daily.  He has also been taking diclofenac  75 mg half a tablet twice daily.  He has not experienced any major change in joint pain or mobility.  Left wrist is still limited in extension.  He has been getting increased swelling near the base of the thumb not associated with any pain redness or warmth in that area.  He followed up with Dr. Christiane Cowing did not do any new procedure did recommend considering hand surgical specialist consultation.  He is concerned about this affecting his left wrist and thumb movement worse which would limit golfing.   10/05/2022 Taylor Heath is a 63 y.o. male here for follow up for seropositive rheumatoid arthritis with complication of left wrist extensor tendon rupture. Now on methotrexate  15 mg p.o. weekly folic acid  1 mg daily.  He has also been taking diclofenac  one half of a 75 mg tablet twice daily.  Feels arthritis symptoms are well-controlled not having significant morning joint pain or stiffness.  He has been staying physically active usually with golf 2 to 3 days/week feels his  leg strength and mobility is a little bit improved.  Still has small amount of swelling around the left wrist and thumb but decreased compared to our last visit.   06/30/2022  Taylor Heath is a 63 y.o. male here for follow up for seropositive rheumatoid arthritis with complication of left wrist extensor tendon rupture.  Now on methotrexate  15 mg p.o. weekly folic acid  1 mg daily has not noticed any particular drug side effects.  He had some swelling in his right hand and wrist that improved without any additional intervention.  Still has some restriction in range of motion at the left hand and wrist where he has surgical scars on the  dorsal side of the hand.  Not causing him any particular functional limitation and not very painful.  He is asking about the folic acid  supplementation as he is also on a daily multivitamin that he thinks contains 600 mcg of folic acid .   04/13/22 Taylor Heath is a 63 y.o. male here for follow up for rheumatoid arthritis with complication of left wrist extensor tendon rupture and prolonged recovery from surgical repair due to localized infection.  He finished his oral doxycycline  completely over the interval with no additional infectious disease follow-up scheduled.  He is working with occupational therapy on his extension he is almost able to achieve a full movement on the third and fourth finger.  No additional swelling and surgical scar is well-healed.  He has noticed some more pain in his right wrist that he attributes to relying more heavily on the 1 hand due to the prolonged immobilization and recovery on his left side.  He also has carpal tunnel syndrome which may be contributing.  He is scheduled for umbilical hernia repair surgery on the 20th.   02/12/22 Taylor Heath is a 63 y.o. male here for seropositive RA. He previously saw Dr. Freddi Jaeger in 2016 without active disease noted at that time persistent positive CCP Abs.  He was never started on any long-term DMARD.  He has had some degree of chronic joint pain at the hands mostly related to first [facility] joint osteoarthritis.  Otherwise also with some chronic osteoarthritis symptoms affecting the knees and hips.  Recently with complications of tenosynovitis and hand deformity complicated by extensor tendon damage progressing towards possible rupture. Surgical repair with tenolysis was complicated recently with surgical site infection with staph aureus requiring debridement and subsequent prolonged antibiotic on linezolid  then doxycycline  planned for next month.  Currently symptoms are pretty much at baseline excepting the left wrist which has some  stiffness and decreased range of motion with the ongoing healing dorsal surgical wound.  He has a family history his father had severe deforming rheumatoid arthritis.   Labs reviewed 10/2014 RF 106 CCP 34   Imaging reviewed MRI Left Wrist 11/26/21 IMPRESSION: 1. Moderate to high-grade fourth and fifth, moderate second and third, and mild to moderate 6th dorsal extensor compartment tenosynovitis. There is scattered debris and some thin internal septations within the tendon sheath fluid indicating mild complexity. 2. Multiple interstitial tears within the fourth and fifth extensor compartment tendons. Moderate extensor carpi ulnaris tendinosis with small interstitial tears. 3. Possible partial-thickness tear of the distal peripheral triangular fibrocartilage complex. 4. Possible partial-thickness tear of the proximal aspect of the lunotriquetral ligament. 5. Moderate to severe triscaphe, moderate thumb carpometacarpal, and moderate lunotriquetral cartilage degenerative changes. Scattered carpal subchondral cysts.   Bilateral hand xrays 03/2020 multiple carpal cysts, mild to moderate 1st [facility]MC joint osteoarthritis  No Rheumatology ROS completed.   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   Radiculopathy of cervical spine 04/26/2017   Total knee replacement status 01/27/2017   Primary osteoarthritis of right shoulder 12/30/2016   Primary osteoarthritis of right knee 12/30/2016   Insomnia 03/10/2012   BPH (benign prostatic hyperplasia) 12/09/2011   GERD (gastroesophageal reflux disease) 12/09/2011    Past Medical History:   Diagnosis Date   Anxiety    Arthritis    GERD (gastroesophageal reflux disease)    History of kidney stones    Pneumonia     Family History  Problem Relation Age of Onset   Rheum arthritis Father    Past Surgical History:  Procedure Laterality Date   FINGER SURGERY Left    index   HAND SURGERY Left 04/04/2023   HERNIA REPAIR  04/30/2022   I & D EXTREMITY Left 01/27/2022   Procedure: IRRIGATION AND DEBRIDEMENT LEFT HAND;  Surgeon: Wes Hamman, MD;  Location: [facility] OR;  Service: Orthopedics;  Laterality: Left;   I & D EXTREMITY Left 01/29/2022   Procedure: REPEAT IRRIGATION AND DEBRIDEMENT OF HAND;  Surgeon: Wes Hamman, MD;  Location: [facility] OR;  Service: Orthopedics;  Laterality: Left;   JOINT REPLACEMENT     left hip replaced   KNEE ARTHROSCOPY W/ ACL RECONSTRUCTION Right 2009   LEG SURGERY Left    left calf   MINOR APPLICATION OF WOUND VAC  01/29/2022   Procedure: APPLICATION OF WOUND VAC;  Surgeon: Wes Hamman, MD;  Location: [facility] OR;  Service: Orthopedics;;   ORIF FINGER / THUMB FRACTURE     TOTAL KNEE ARTHROPLASTY Right 01/27/2017   Procedure: RIGHT TOTAL KNEE ARTHROPLASTY;  Surgeon: Wes Hamman, MD;  Location: [facility] OR;  Service: Orthopedics;  Laterality: Right;   Social History   Social History Narrative   Not on file    There is no immunization history on file for this patient.   Objective: Vital Signs: There were no vitals taken for this visit.   Physical Exam   Musculoskeletal Exam: ***  CDAI Exam: CDAI Score: -- Patient Global: --; Provider Global: -- Swollen: --; Tender: -- Joint Exam 08/11/2023   No joint exam has been documented for this visit   There is currently no information documented on the homunculus. Go to the Rheumatology activity and complete the homunculus joint exam.  Investigation: No additional findings.  Imaging: No results found.  Recent Labs: Lab Results  Component Value Date   WBC 5.6 05/19/2023   HGB 14.8 05/19/2023    PLT 318 05/19/2023   NA 141 05/19/2023   K 4.5 05/19/2023   CL 104 05/19/2023   CO2 28 05/19/2023   GLUCOSE 88 05/19/2023   BUN 12 05/19/2023   CREATININE 0.81 05/19/2023   BILITOT 0.6 05/19/2023   ALKPHOS 65 01/19/2017   AST 19 05/19/2023   ALT 23 05/19/2023   PROT 7.1 05/19/2023   ALBUMIN 4.5 01/19/2017   CALCIUM 9.6 05/19/2023   GFRAA >60 01/28/2017   QFTBGOLDPLUS NEGATIVE 02/12/2022    Speciality Comments: No specialty comments available.  Procedures:  No procedures performed Allergies: Meperidine  Assessment / Plan:     Visit Diagnoses: No diagnosis found.  ***  Orders: No orders of the defined types were placed in this encounter.  No orders of the defined types were placed in this encounter.    Follow-Up Instructions: No follow-ups on file.   Glena Landau, RT  Note - This record has been created using [employer].  Chart creation errors have been sought, but may not always  have been located. Such creation errors do not reflect on  the standard of medical care.

## 2023-08-05 NOTE — Progress Notes (Signed)
 Surgical Instructions   Your procedure is scheduled on Monday, June 16th. Report to [facility] Main Entrance "A" at 5;30 A.M., then check in with the Admitting office. Any questions or running late day of surgery: call [phone]  Questions prior to your surgery date: call [phone], Monday-Friday, 8am-4pm. If you experience any cold or flu symptoms such as cough, fever, chills, shortness of breath, etc. between now and your scheduled surgery, please notify us  at the above number.     Remember:  Do not eat after midnight the night before your surgery  You may drink clear liquids until 4:15 the morning of your surgery.   Clear liquids allowed are: Water, Non-Citrus Juices (without pulp), Carbonated Beverages, Clear Tea (no milk, honey, etc.), Black Coffee Only (NO MILK, CREAM OR POWDERED CREAMER of any kind), and Gatorade.    Take these medicines the morning of surgery with A SIP OF WATER  omeprazole  (PRILOSEC  OTC)  pravastatin  (PRAVACHOL )  valACYclovir  (VALTREX )   May take these medicines IF NEEDED: acetaminophen  (TYLENOL )  HYDROcodone -acetaminophen  (NORCO/VICODIN)   One week prior to surgery, STOP taking any Aspirin  (unless otherwise instructed by your surgeon) Aleve, Naproxen, Motrin, Goody's, BC's, all herbal medications, fish oil, and non-prescription vitamins. This includes your ibuprofen (ADVIL) and diclofenac  (VOLTAREN ) gel.                      Do NOT Smoke (Tobacco/Vaping) for 24 hours prior to your procedure.  If you use a CPAP at night, you may bring your mask/headgear for your overnight stay.   You will be asked to remove any contacts, glasses, piercing's, hearing aid's, dentures/partials prior to surgery. Please bring cases for these items if needed.    Patients discharged the day of surgery will not be allowed to drive home, and someone needs to stay with them for 24 hours.  SURGICAL WAITING ROOM VISITATION Patients may have no more than 2 support people in  the waiting area - these visitors may rotate.   Pre-op nurse will coordinate an appropriate time for 1 ADULT support person, who may not rotate, to accompany patient in pre-op.  Children under the age of 25 must have an adult with them who is not the patient and must remain in the main waiting area with an adult.  If the patient needs to stay at the hospital during part of their recovery, the visitor guidelines for inpatient rooms apply.  Please refer to the [facility] website for the visitor guidelines for any additional information.   If you received a COVID test during your pre-op visit  it is requested that you wear a mask when out in public, stay away from anyone that may not be feeling well and notify your surgeon if you develop symptoms. If you have been in contact with anyone that has tested positive in the last 10 days please notify you surgeon.      Pre-operative 5 CHG Bathing Instructions   You can play a key role in reducing the risk of infection after surgery. Your skin needs to be as free of germs as possible. You can reduce the number of germs on your skin by washing with CHG (chlorhexidine  gluconate) soap before surgery. CHG is an antiseptic soap that kills germs and continues to kill germs even after washing.   DO NOT use if you have an allergy to chlorhexidine /CHG or antibacterial soaps. If your skin becomes reddened or irritated, stop using the CHG and notify one of  our RNs at [phone].   Please shower with the CHG soap starting 4 days before surgery using the following schedule:     Please keep in mind the following:  DO NOT shave, including legs and underarms, starting the day of your first shower.   You may shave your face at any point before/day of surgery.  Place clean sheets on your bed the day you start using CHG soap. Use a clean washcloth (not used since being washed) for each shower. DO NOT sleep with pets once you start using the CHG.   CHG Shower  Instructions:  Wash your face and private area with normal soap. If you choose to wash your hair, wash first with your normal shampoo.  After you use shampoo/soap, rinse your hair and body thoroughly to remove shampoo/soap residue.  Turn the water OFF and apply about 3 tablespoons (45 ml) of CHG soap to a CLEAN washcloth.  Apply CHG soap ONLY FROM YOUR NECK DOWN TO YOUR TOES (washing for 3-5 minutes)  DO NOT use CHG soap on face, private areas, open wounds, or sores.  Pay special attention to the area where your surgery is being performed.  If you are having back surgery, having someone wash your back for you may be helpful. Wait 2 minutes after CHG soap is applied, then you may rinse off the CHG soap.  Pat dry with a clean towel  Put on clean clothes/pajamas   If you choose to wear lotion, please use ONLY the CHG-compatible lotions that are listed below.  Additional instructions for the day of surgery: DO NOT APPLY any lotions, deodorants, cologne, or perfumes.   Do not bring valuables to the hospital. [facility] is not responsible for any belongings/valuables. Do not wear nail polish, gel polish, artificial nails, or any other type of covering on natural nails (fingers and toes) Do not wear jewelry or makeup Put on clean/comfortable clothes.  Please brush your teeth.  Ask your nurse before applying any prescription medications to the skin.     CHG Compatible Lotions   Aveeno Moisturizing lotion  Cetaphil Moisturizing Cream  Cetaphil Moisturizing Lotion  Clairol Herbal Essence Moisturizing Lotion, Dry Skin  Clairol Herbal Essence Moisturizing Lotion, Extra Dry Skin  Clairol Herbal Essence Moisturizing Lotion, Normal Skin  Curel Age Defying Therapeutic Moisturizing Lotion with Alpha Hydroxy  Curel Extreme Care Body Lotion  Curel Soothing Hands Moisturizing Hand Lotion  Curel Therapeutic Moisturizing Cream, Fragrance-Free  Curel Therapeutic Moisturizing Lotion, Fragrance-Free   Curel Therapeutic Moisturizing Lotion, Original Formula  Eucerin Daily Replenishing Lotion  Eucerin Dry Skin Therapy Plus Alpha Hydroxy Crme  Eucerin Dry Skin Therapy Plus Alpha Hydroxy Lotion  Eucerin Original Crme  Eucerin Original Lotion  Eucerin Plus Crme Eucerin Plus Lotion  Eucerin TriLipid Replenishing Lotion  Keri Anti-Bacterial Hand Lotion  Keri Deep Conditioning Original Lotion Dry Skin Formula Softly Scented  Keri Deep Conditioning Original Lotion, Fragrance Free Sensitive Skin Formula  Keri Lotion Fast Absorbing Fragrance Free Sensitive Skin Formula  Keri Lotion Fast Absorbing Softly Scented Dry Skin Formula  Keri Original Lotion  Keri Skin Renewal Lotion Keri Silky Smooth Lotion  Keri Silky Smooth Sensitive Skin Lotion  Nivea Body Creamy Conditioning Oil  Nivea Body Extra Enriched Teacher, adult education Moisturizing Lotion Nivea Crme  Nivea Skin Firming Lotion  NutraDerm 30 Skin Lotion  NutraDerm Skin Lotion  NutraDerm Therapeutic Skin Cream  NutraDerm Therapeutic Skin Lotion  ProShield Protective Hand Cream  Provon moisturizing lotion  Please read over the following fact sheets that you were given.

## 2023-08-07 ENCOUNTER — Other Ambulatory Visit: Payer: Self-pay | Admitting: Internal Medicine

## 2023-08-07 DIAGNOSIS — M059 Rheumatoid arthritis with rheumatoid factor, unspecified: Secondary | ICD-10-CM

## 2023-08-08 ENCOUNTER — Encounter (HOSPITAL_COMMUNITY)
Admission: RE | Admit: 2023-08-08 | Discharge: 2023-08-08 | Disposition: A | Discharge status: Home / Self Care | Source: Ambulatory Visit | Attending: Orthopaedic Surgery | Admitting: Orthopaedic Surgery

## 2023-08-08 ENCOUNTER — Other Ambulatory Visit: Payer: Self-pay

## 2023-08-08 ENCOUNTER — Telehealth: Payer: Self-pay | Admitting: Internal Medicine

## 2023-08-08 ENCOUNTER — Encounter (HOSPITAL_COMMUNITY): Payer: Self-pay

## 2023-08-08 VITALS — BP 155/91 | HR 78 | Temp 98.1°F | Resp 18 | Ht 69.0 in | Wt 165.0 lb

## 2023-08-08 DIAGNOSIS — Z01818 Encounter for other preprocedural examination: Secondary | ICD-10-CM | POA: Diagnosis present

## 2023-08-08 DIAGNOSIS — Z01812 Encounter for preprocedural laboratory examination: Secondary | ICD-10-CM | POA: Diagnosis not present

## 2023-08-08 DIAGNOSIS — M1611 Unilateral primary osteoarthritis, right hip: Secondary | ICD-10-CM | POA: Insufficient documentation

## 2023-08-08 HISTORY — DX: Rheumatoid arthritis, unspecified: M06.9

## 2023-08-08 LAB — TYPE AND SCREEN
ABO/RH(D): B POS
Antibody Screen: NEGATIVE

## 2023-08-08 LAB — CBC
HCT: 44 % (ref 39.0–52.0)
Hemoglobin: 14.5 g/dL (ref 13.0–17.0)
MCH: 31.7 pg (ref 26.0–34.0)
MCHC: 33 g/dL (ref 30.0–36.0)
MCV: 96.3 fL (ref 80.0–100.0)
Platelets: 306 10*3/uL (ref 150–400)
RBC: 4.57 MIL/uL (ref 4.22–5.81)
RDW: 12.9 % (ref 11.5–15.5)
WBC: 4.8 10*3/uL (ref 4.0–10.5)
nRBC: 0 % (ref 0.0–0.2)

## 2023-08-08 LAB — BASIC METABOLIC PANEL WITH GFR
Anion gap: 8 (ref 5–15)
BUN: 16 mg/dL (ref 8–23)
CO2: 26 mmol/L (ref 22–32)
Calcium: 8.9 mg/dL (ref 8.9–10.3)
Chloride: 105 mmol/L (ref 98–111)
Creatinine, Ser: 0.76 mg/dL (ref 0.61–1.24)
GFR, Estimated: >60 mL/min (ref >60)
Glucose, Bld: 105 mg/dL — ABNORMAL HIGH (ref 70–99)
Potassium: 4.3 mmol/L (ref 3.5–5.1)
Sodium: 139 mmol/L (ref 135–145)

## 2023-08-08 LAB — SURGICAL PCR SCREEN
MRSA, PCR: NEGATIVE
Staphylococcus aureus: NEGATIVE

## 2023-08-08 NOTE — Telephone Encounter (Signed)
 Pt called requesting someone to call him about his appointment on 08/11/23. Pt would like to know if he needs to come to that appointment due to him not taking his medication due to having surgery the Monday 08/15/23

## 2023-08-08 NOTE — Progress Notes (Signed)
 Surgical Instructions   Your procedure is scheduled on Monday, June 16th, 2025. Report to [facility] Main Entrance "A" at 5:30 A.M., then check in with the Admitting office. Any questions or running late day of surgery: call [phone]  Questions prior to your surgery date: call [phone], Monday-Friday, 8am-4pm. If you experience any cold or flu symptoms such as cough, fever, chills, shortness of breath, etc. between now and your scheduled surgery, please notify us  at the above number.     Remember:  Do not eat after midnight the night before your surgery  You may drink clear liquids until 4:15 the morning of your surgery.   Clear liquids allowed are: Water, Non-Citrus Juices (without pulp), Carbonated Beverages, Clear Tea (no milk, honey, etc.), Black Coffee Only (NO MILK, CREAM OR POWDERED CREAMER of any kind), and Gatorade.   Patient Instructions  The night before surgery:  No food after midnight. ONLY clear liquids after midnight  The day of surgery (if you do NOT have diabetes):  Drink ONE (1) Pre-Surgery Clear Ensure by 4:15 the morning of surgery. Drink in one sitting. Do not sip.  This drink was given to you during your hospital  pre-op appointment visit.  Nothing else to drink after completing the  Pre-Surgery Clear Ensure.          If you have questions, please contact your surgeon's office.     Take these medicines the morning of surgery with A SIP OF WATER: Omeprazole  (Prilosec ) Pravastatin  (Pravachol ) Valacyclovir  (Valtrex )   May take these medicines IF NEEDED: Acetaminophen  (Tylenol )    One week prior to surgery, STOP taking any Aspirin  (unless otherwise instructed by your surgeon) Aleve, Naproxen, Ibuprofen, Motrin, Advil, Goody's, BC's, all herbal medications, fish oil, and non-prescription vitamins.  This also includes Diclofenac  (Voltaren ).                       Do NOT Smoke (Tobacco/Vaping) for 24 hours prior to your procedure.  If you use  a CPAP at night, you may bring your mask/headgear for your overnight stay.   You will be asked to remove any contacts, glasses, piercing's, hearing aid's, dentures/partials prior to surgery. Please bring cases for these items if needed.    Patients discharged the day of surgery will not be allowed to drive home, and someone needs to stay with them for 24 hours.  SURGICAL WAITING ROOM VISITATION Patients may have no more than 2 support people in the waiting area - these visitors may rotate.   Pre-op nurse will coordinate an appropriate time for 1 ADULT support person, who may not rotate, to accompany patient in pre-op.  Children under the age of 47 must have an adult with them who is not the patient and must remain in the main waiting area with an adult.  If the patient needs to stay at the hospital during part of their recovery, the visitor guidelines for inpatient rooms apply.  Please refer to the [facility] website for the visitor guidelines for any additional information.   If you received a COVID test during your pre-op visit  it is requested that you wear a mask when out in public, stay away from anyone that may not be feeling well and notify your surgeon if you develop symptoms. If you have been in contact with anyone that has tested positive in the last 10 days please notify you surgeon.      Pre-operative 5 CHG Bathing Instructions  You can play a key role in reducing the risk of infection after surgery. Your skin needs to be as free of germs as possible. You can reduce the number of germs on your skin by washing with CHG (chlorhexidine  gluconate) soap before surgery. CHG is an antiseptic soap that kills germs and continues to kill germs even after washing.   DO NOT use if you have an allergy to chlorhexidine /CHG or antibacterial soaps. If your skin becomes reddened or irritated, stop using the CHG and notify one of our RNs at [phone].   Please shower with the CHG soap  starting 4 days before surgery using the following schedule:     Please keep in mind the following:  DO NOT shave, including legs and underarms, starting the day of your first shower.   You may shave your face at any point before/day of surgery.  Place clean sheets on your bed the day you start using CHG soap. Use a clean washcloth (not used since being washed) for each shower. DO NOT sleep with pets once you start using the CHG.   CHG Shower Instructions:  Wash your face and private area with normal soap. If you choose to wash your hair, wash first with your normal shampoo.  After you use shampoo/soap, rinse your hair and body thoroughly to remove shampoo/soap residue.  Turn the water OFF and apply about 3 tablespoons (45 ml) of CHG soap to a CLEAN washcloth.  Apply CHG soap ONLY FROM YOUR NECK DOWN TO YOUR TOES (washing for 3-5 minutes)  DO NOT use CHG soap on face, private areas, open wounds, or sores.  Pay special attention to the area where your surgery is being performed.  If you are having back surgery, having someone wash your back for you may be helpful. Wait 2 minutes after CHG soap is applied, then you may rinse off the CHG soap.  Pat dry with a clean towel  Put on clean clothes/pajamas   If you choose to wear lotion, please use ONLY the CHG-compatible lotions that are listed below.  Additional instructions for the day of surgery: DO NOT APPLY any lotions, deodorants, cologne, or perfumes.   Do not bring valuables to the hospital. [facility] is not responsible for any belongings/valuables. Do not wear nail polish, gel polish, artificial nails, or any other type of covering on natural nails (fingers and toes) Do not wear jewelry or makeup Put on clean/comfortable clothes.  Please brush your teeth.  Ask your nurse before applying any prescription medications to the skin.     CHG Compatible Lotions   Aveeno Moisturizing lotion  Cetaphil Moisturizing Cream  Cetaphil  Moisturizing Lotion  Clairol Herbal Essence Moisturizing Lotion, Dry Skin  Clairol Herbal Essence Moisturizing Lotion, Extra Dry Skin  Clairol Herbal Essence Moisturizing Lotion, Normal Skin  Curel Age Defying Therapeutic Moisturizing Lotion with Alpha Hydroxy  Curel Extreme Care Body Lotion  Curel Soothing Hands Moisturizing Hand Lotion  Curel Therapeutic Moisturizing Cream, Fragrance-Free  Curel Therapeutic Moisturizing Lotion, Fragrance-Free  Curel Therapeutic Moisturizing Lotion, Original Formula  Eucerin Daily Replenishing Lotion  Eucerin Dry Skin Therapy Plus Alpha Hydroxy Crme  Eucerin Dry Skin Therapy Plus Alpha Hydroxy Lotion  Eucerin Original Crme  Eucerin Original Lotion  Eucerin Plus Crme Eucerin Plus Lotion  Eucerin TriLipid Replenishing Lotion  Keri Anti-Bacterial Hand Lotion  Keri Deep Conditioning Original Lotion Dry Skin Formula Softly Scented  Keri Deep Conditioning Original Lotion, Fragrance Free Sensitive Skin Formula  Keri Lotion Fast Absorbing  Fragrance Free Sensitive Skin Formula  Keri Lotion Fast Absorbing Softly Scented Dry Skin Formula  Keri Original Lotion  Keri Skin Renewal Lotion Keri Silky Smooth Lotion  Keri Silky Smooth Sensitive Skin Lotion  Nivea Body Creamy Conditioning Oil  Nivea Body Extra Enriched Lotion  Nivea Body Original Lotion  Nivea Body Sheer Moisturizing Lotion Nivea Crme  Nivea Skin Firming Lotion  NutraDerm 30 Skin Lotion  NutraDerm Skin Lotion  NutraDerm Therapeutic Skin Cream  NutraDerm Therapeutic Skin Lotion  ProShield Protective Hand Cream  Provon moisturizing lotion  Please read over the following fact sheets that you were given.

## 2023-08-08 NOTE — Telephone Encounter (Signed)
 Last Fill: 07/13/2023  Labs: 05/19/2023 Sed rate of 6 is normal. His blood count and kidney and liver function are all normal.   Next Visit: 08/11/2023  Last Visit: 04/13/2023  DX: Seropositive rheumatoid arthritis (HCC)   Current Dose per office note 04/13/2023: methotrexate  15 mg p.o. weekly   Okay to refill Methotrexate ?

## 2023-08-08 NOTE — Progress Notes (Signed)
 PCP - Kathyleen Parkins Cardiologist - denies  PPM/ICD - denies   Chest x-ray - denies EKG - denies Stress Test - denies ECHO - denies Cardiac Cath - denies  Sleep Study - denies   No DM.   Last dose of GLP1 agonist-  n/a GLP1 instructions: n/a  Blood Thinner Instructions:  n/a Aspirin  Instructions: n/a  Methotrexate  - last dose 5/28  ERAS Protcol - clears until 0415 PRE-SURGERY Ensure or G2- Ensure as ordered  COVID TEST- n/a   Anesthesia review: yes - requested records from PCP.   Patient denies shortness of breath, fever, cough and chest pain at PAT appointment   All instructions explained to the patient, with a verbal understanding of the material. Patient agrees to go over the instructions while at home for a better understanding. Patient also instructed to self quarantine after being tested for COVID-19. The opportunity to ask questions was provided.

## 2023-08-09 NOTE — Telephone Encounter (Signed)
 Patient contacted the office to see if he was okay to cancel his appointment for 08/11/2023 due to not taking methotrexate  because of an upcoming surgery.   Advised that He should be okay to reschedule, since we would not be able to check for monitoring or make any changes with an appointment timed immediately before surgery. He should be able to resume around 2 weeks after surgery unless there are complications, and we can aim to see him back another month after that.   Patinet stated he plans to start the methotrexate  back on July 2nd and then come in in August after starting it back. Sent patient to the front to reschedule

## 2023-08-09 NOTE — Telephone Encounter (Signed)
 He should be okay to reschedule, since we would not be able to check for monitoring or make any changes with an appointment timed immediately before surgery. He should be able to resume around 2 weeks after surgery unless there are complications, and we can aim to see him back another month after that.

## 2023-08-11 ENCOUNTER — Ambulatory Visit: Payer: Commercial Managed Care - HMO | Admitting: Internal Medicine

## 2023-08-11 DIAGNOSIS — Z79899 Other long term (current) drug therapy: Secondary | ICD-10-CM

## 2023-08-11 DIAGNOSIS — M65932 Unspecified synovitis and tenosynovitis, left forearm: Secondary | ICD-10-CM

## 2023-08-11 DIAGNOSIS — M059 Rheumatoid arthritis with rheumatoid factor, unspecified: Secondary | ICD-10-CM

## 2023-08-12 MED ORDER — TRANEXAMIC ACID 1000 MG/10ML IV SOLN
2000.0000 mg | INTRAVENOUS | Status: DC
  Filled 2023-08-12 (×3): qty 20

## 2023-08-14 NOTE — Anesthesia Preprocedure Evaluation (Signed)
 Anesthesia Evaluation  Patient identified by MRN, date of birth, ID band Patient awake    Reviewed: Allergy & Precautions, NPO status , Patient's Chart, lab work & pertinent test results  History of Anesthesia Complications Negative for: history of anesthetic complications  Airway Mallampati: II  TM Distance: >3 FB Neck ROM: Full    Dental  (+) Dental Advisory Given   Pulmonary former smoker   Pulmonary exam normal        Cardiovascular negative cardio ROS Normal cardiovascular exam     Neuro/Psych  PSYCHIATRIC DISORDERS Anxiety     negative neurological ROS     GI/Hepatic Neg liver ROS,GERD  Medicated and Controlled,,  Endo/Other  negative endocrine ROS    Renal/GU negative Renal ROS     Musculoskeletal  (+) Arthritis , Rheumatoid disorders,    Abdominal   Peds  Hematology negative hematology ROS (+)   Anesthesia Other Findings   Reproductive/Obstetrics                             Anesthesia Physical Anesthesia Plan  ASA: 2  Anesthesia Plan: Spinal   Post-op Pain Management: Tylenol  PO (pre-op)*   Induction:   PONV Risk Score and Plan: 1 and Treatment may vary due to age or medical condition and Propofol  infusion  Airway Management Planned: Natural Airway and Simple Face Mask  Additional Equipment: None  Intra-op Plan:   Post-operative Plan:   Informed Consent: I have reviewed the patients History and Physical, chart, labs and discussed the procedure including the risks, benefits and alternatives for the proposed anesthesia with the patient or authorized representative who has indicated his/her understanding and acceptance.       Plan Discussed with: CRNA and Anesthesiologist  Anesthesia Plan Comments: (Labs reviewed, platelets acceptable. Discussed risks and benefits of spinal, including spinal/epidural hematoma, infection, failed block, and PDPH. Patient expressed  understanding and wished to proceed. )       Anesthesia Quick Evaluation

## 2023-08-15 ENCOUNTER — Observation Stay (HOSPITAL_COMMUNITY)

## 2023-08-15 ENCOUNTER — Encounter (HOSPITAL_COMMUNITY)
Admission: RE | Disposition: A | Discharge status: Home / Self Care | Payer: Self-pay | Source: Home / Self Care | Attending: Orthopaedic Surgery

## 2023-08-15 ENCOUNTER — Ambulatory Visit (HOSPITAL_COMMUNITY)

## 2023-08-15 ENCOUNTER — Other Ambulatory Visit (HOSPITAL_COMMUNITY): Payer: Self-pay

## 2023-08-15 ENCOUNTER — Observation Stay (HOSPITAL_COMMUNITY)
Admission: RE | Admit: 2023-08-15 | Discharge: 2023-08-15 | Disposition: A | Discharge status: Home / Self Care | Attending: Orthopaedic Surgery | Admitting: Orthopaedic Surgery

## 2023-08-15 ENCOUNTER — Other Ambulatory Visit: Payer: Self-pay

## 2023-08-15 ENCOUNTER — Ambulatory Visit (HOSPITAL_COMMUNITY): Payer: Self-pay | Admitting: Anesthesiology

## 2023-08-15 ENCOUNTER — Encounter (HOSPITAL_COMMUNITY): Payer: Self-pay | Admitting: Orthopaedic Surgery

## 2023-08-15 ENCOUNTER — Ambulatory Visit (HOSPITAL_BASED_OUTPATIENT_CLINIC_OR_DEPARTMENT_OTHER): Payer: Self-pay | Admitting: Anesthesiology

## 2023-08-15 ENCOUNTER — Other Ambulatory Visit: Payer: Self-pay | Admitting: Physician Assistant

## 2023-08-15 DIAGNOSIS — Z87891 Personal history of nicotine dependence: Secondary | ICD-10-CM | POA: Insufficient documentation

## 2023-08-15 DIAGNOSIS — Z7982 Long term (current) use of aspirin: Secondary | ICD-10-CM | POA: Insufficient documentation

## 2023-08-15 DIAGNOSIS — Z96641 Presence of right artificial hip joint: Secondary | ICD-10-CM | POA: Insufficient documentation

## 2023-08-15 DIAGNOSIS — M1611 Unilateral primary osteoarthritis, right hip: Secondary | ICD-10-CM

## 2023-08-15 HISTORY — PX: TOTAL HIP ARTHROPLASTY: SHX124

## 2023-08-15 SURGERY — ARTHROPLASTY, HIP, TOTAL, ANTERIOR APPROACH
Anesthesia: Spinal | Site: Hip | Laterality: Right

## 2023-08-15 MED ORDER — ASPIRIN 81 MG PO CHEW
81.0000 mg | CHEWABLE_TABLET | Freq: Two times a day (BID) | ORAL | 0 refills | Status: AC
Start: 2023-08-15 — End: ?
  Filled 2023-08-15: qty 84, 42d supply, fill #0

## 2023-08-15 MED ORDER — PRONTOSAN WOUND IRRIGATION OPTIME
TOPICAL | Status: DC | PRN
  Administered 2023-08-15: 1

## 2023-08-15 MED ORDER — EPHEDRINE SULFATE-NACL 50-0.9 MG/10ML-% IV SOSY
PREFILLED_SYRINGE | INTRAVENOUS | Status: DC | PRN
  Administered 2023-08-15: 5 mg via INTRAVENOUS

## 2023-08-15 MED ORDER — FENTANYL CITRATE (PF) 250 MCG/5ML IJ SOLN
INTRAMUSCULAR | Status: AC
  Filled 2023-08-15: qty 5

## 2023-08-15 MED ORDER — ONDANSETRON HCL 4 MG/2ML IJ SOLN
INTRAMUSCULAR | Status: DC | PRN
  Administered 2023-08-15: 4 mg via INTRAVENOUS

## 2023-08-15 MED ORDER — POVIDONE-IODINE 10 % EX SWAB: 2.0000 | Freq: Once | CUTANEOUS | Status: AC

## 2023-08-15 MED ORDER — HYDROMORPHONE HCL 1 MG/ML IJ SOLN
INTRAMUSCULAR | Status: DC | PRN
  Administered 2023-08-15: .5 mg via INTRAVENOUS

## 2023-08-15 MED ORDER — OXYCODONE HCL 5 MG PO TABS
ORAL_TABLET | ORAL | Status: AC
  Filled 2023-08-15: qty 1

## 2023-08-15 MED ORDER — PROPOFOL 10 MG/ML IV BOLUS
INTRAVENOUS | Status: AC
  Filled 2023-08-15: qty 20

## 2023-08-15 MED ORDER — HYDROMORPHONE HCL 1 MG/ML IJ SOLN
INTRAMUSCULAR | Status: AC
  Filled 2023-08-15: qty 0.5

## 2023-08-15 MED ORDER — BUPIVACAINE IN DEXTROSE 0.75-8.25 % IT SOLN
INTRATHECAL | Status: DC | PRN
  Administered 2023-08-15: 1.6 mL via INTRATHECAL

## 2023-08-15 MED ORDER — FENTANYL CITRATE (PF) 250 MCG/5ML IJ SOLN
INTRAMUSCULAR | Status: DC | PRN
  Administered 2023-08-15 (×2): 100 ug via INTRAVENOUS
  Administered 2023-08-15: 50 ug via INTRAVENOUS

## 2023-08-15 MED ORDER — DEXAMETHASONE SODIUM PHOSPHATE 10 MG/ML IJ SOLN
INTRAMUSCULAR | Status: AC
  Filled 2023-08-15: qty 1

## 2023-08-15 MED ORDER — DOXYCYCLINE HYCLATE 100 MG PO TABS
100.0000 mg | ORAL_TABLET | Freq: Two times a day (BID) | ORAL | 0 refills | Status: DC
  Filled 2023-08-15: qty 28, 14d supply, fill #0

## 2023-08-15 MED ORDER — DEXAMETHASONE SODIUM PHOSPHATE 10 MG/ML IJ SOLN
INTRAMUSCULAR | Status: DC | PRN
  Administered 2023-08-15: 10 mg via INTRAVENOUS

## 2023-08-15 MED ORDER — ONDANSETRON HCL 4 MG/2ML IJ SOLN
INTRAMUSCULAR | Status: AC
Start: 2023-08-15 — End: 2023-08-15
  Filled 2023-08-15: qty 2

## 2023-08-15 MED ORDER — BUPIVACAINE-MELOXICAM ER 400-12 MG/14ML IJ SOLN
INTRAMUSCULAR | Status: DC | PRN
  Administered 2023-08-15: 400 mg

## 2023-08-15 MED ORDER — ACETAMINOPHEN 500 MG PO TABS
1000.0000 mg | ORAL_TABLET | Freq: Once | ORAL | Status: DC
  Filled 2023-08-15: qty 2

## 2023-08-15 MED ORDER — DOCUSATE SODIUM 100 MG PO CAPS
100.0000 mg | ORAL_CAPSULE | Freq: Every day | ORAL | 2 refills | Status: AC | PRN
  Filled 2023-08-15: qty 30, 30d supply, fill #0

## 2023-08-15 MED ORDER — TRANEXAMIC ACID-NACL 1000-0.7 MG/100ML-% IV SOLN
1000.0000 mg | INTRAVENOUS | Status: AC
  Administered 2023-08-15: 1000 mg via INTRAVENOUS
  Filled 2023-08-15: qty 100

## 2023-08-15 MED ORDER — SODIUM CHLORIDE 0.9 % IR SOLN
Status: DC | PRN
  Administered 2023-08-15: 3000 mL

## 2023-08-15 MED ORDER — FENTANYL CITRATE (PF) 100 MCG/2ML IJ SOLN: 25.0000 ug | INTRAMUSCULAR | Status: DC | PRN

## 2023-08-15 MED ORDER — HYDRALAZINE HCL 20 MG/ML IJ SOLN
INTRAMUSCULAR | Status: AC
  Filled 2023-08-15: qty 1

## 2023-08-15 MED ORDER — LACTATED RINGERS IV BOLUS: 500.0000 mL | Freq: Once | INTRAVENOUS | Status: AC

## 2023-08-15 MED ORDER — LACTATED RINGERS IV SOLN: INTRAVENOUS | Status: DC

## 2023-08-15 MED ORDER — ORAL CARE MOUTH RINSE
15.0000 mL | Freq: Once | OROMUCOSAL | Status: AC
Start: 2023-08-15 — End: 2023-08-15

## 2023-08-15 MED ORDER — SODIUM CHLORIDE 0.9 % IV SOLN: 12.5000 mg | INTRAVENOUS | Status: DC | PRN

## 2023-08-15 MED ORDER — CEFAZOLIN SODIUM-DEXTROSE 2-4 GM/100ML-% IV SOLN
2.0000 g | INTRAVENOUS | Status: AC
  Administered 2023-08-15: 2 g via INTRAVENOUS
  Filled 2023-08-15: qty 100

## 2023-08-15 MED ORDER — LACTATED RINGERS IV BOLUS: 250.0000 mL | Freq: Once | INTRAVENOUS | Status: DC

## 2023-08-15 MED ORDER — TRANEXAMIC ACID-NACL 1000-0.7 MG/100ML-% IV SOLN: 1000.0000 mg | Freq: Once | INTRAVENOUS | Status: DC

## 2023-08-15 MED ORDER — AMISULPRIDE (ANTIEMETIC) 5 MG/2ML IV SOLN
10.0000 mg | Freq: Once | INTRAVENOUS | Status: DC | PRN
Start: 2023-08-15 — End: 2023-08-15

## 2023-08-15 MED ORDER — METHOCARBAMOL 750 MG PO TABS
750.0000 mg | ORAL_TABLET | Freq: Three times a day (TID) | ORAL | 2 refills | Status: DC | PRN
  Filled 2023-08-15: qty 30, 10d supply, fill #0

## 2023-08-15 MED ORDER — OXYCODONE HCL 5 MG PO TABS
5.0000 mg | ORAL_TABLET | Freq: Once | ORAL | Status: AC | PRN
  Administered 2023-08-15: 5 mg via ORAL

## 2023-08-15 MED ORDER — ONDANSETRON HCL 4 MG PO TABS
4.0000 mg | ORAL_TABLET | Freq: Three times a day (TID) | ORAL | 0 refills | Status: AC | PRN
  Filled 2023-08-15: qty 40, 14d supply, fill #0

## 2023-08-15 MED ORDER — 0.9 % SODIUM CHLORIDE (POUR BTL) OPTIME
TOPICAL | Status: DC | PRN
  Administered 2023-08-15: 1000 mL

## 2023-08-15 MED ORDER — BUPIVACAINE-MELOXICAM ER 400-12 MG/14ML IJ SOLN
INTRAMUSCULAR | Status: AC
  Filled 2023-08-15: qty 1

## 2023-08-15 MED ORDER — MIDAZOLAM HCL 2 MG/2ML IJ SOLN
INTRAMUSCULAR | Status: AC
  Filled 2023-08-15: qty 2

## 2023-08-15 MED ORDER — MIDAZOLAM HCL 2 MG/2ML IJ SOLN
INTRAMUSCULAR | Status: DC | PRN
  Administered 2023-08-15: 2 mg via INTRAVENOUS

## 2023-08-15 MED ORDER — PROPOFOL 500 MG/50ML IV EMUL
INTRAVENOUS | Status: DC | PRN
  Administered 2023-08-15: 40 mg via INTRAVENOUS
  Administered 2023-08-15: 50 mg via INTRAVENOUS
  Administered 2023-08-15: 110 mg via INTRAVENOUS
  Administered 2023-08-15: 150 ug/kg/min via INTRAVENOUS

## 2023-08-15 MED ORDER — FENTANYL CITRATE (PF) 100 MCG/2ML IJ SOLN
INTRAMUSCULAR | Status: AC
Start: 2023-08-15 — End: 2023-08-15
  Filled 2023-08-15: qty 2

## 2023-08-15 MED ORDER — VANCOMYCIN HCL 1 G IV SOLR
INTRAVENOUS | Status: DC | PRN
  Administered 2023-08-15: 1000 mg

## 2023-08-15 MED ORDER — OXYCODONE HCL 5 MG/5ML PO SOLN: 5.0000 mg | Freq: Once | ORAL | Status: AC | PRN

## 2023-08-15 MED ORDER — VANCOMYCIN HCL 1000 MG IV SOLR
INTRAVENOUS | Status: AC
  Filled 2023-08-15: qty 20

## 2023-08-15 MED ORDER — OXYCODONE-ACETAMINOPHEN 5-325 MG PO TABS
1.0000 | ORAL_TABLET | Freq: Four times a day (QID) | ORAL | 0 refills | Status: AC | PRN
  Filled 2023-08-15: qty 40, 5d supply, fill #0

## 2023-08-15 MED ORDER — CEFAZOLIN SODIUM-DEXTROSE 2-4 GM/100ML-% IV SOLN
INTRAVENOUS | Status: AC
  Filled 2023-08-15: qty 100

## 2023-08-15 MED ORDER — CEFAZOLIN SODIUM-DEXTROSE 2-4 GM/100ML-% IV SOLN: 2.0000 g | Freq: Four times a day (QID) | INTRAVENOUS | Status: DC

## 2023-08-15 MED ORDER — CHLORHEXIDINE GLUCONATE 0.12 % MT SOLN
15.0000 mL | Freq: Once | OROMUCOSAL | Status: AC
  Filled 2023-08-15: qty 15

## 2023-08-15 MED ORDER — HYDRALAZINE HCL 20 MG/ML IJ SOLN
INTRAMUSCULAR | Status: DC | PRN
  Administered 2023-08-15: 2 mg via INTRAVENOUS

## 2023-08-15 MED ORDER — TRANEXAMIC ACID 1000 MG/10ML IV SOLN
INTRAVENOUS | Status: DC | PRN
  Administered 2023-08-15: 2000 mg via TOPICAL

## 2023-08-15 SURGICAL SUPPLY — 52 items
BAG COUNTER SPONGE SURGICOUNT (BAG) ×1 IMPLANT
BAG DECANTER FOR FLEXI CONT (MISCELLANEOUS) ×1 IMPLANT
BLADE SAG 18X100X1.27 (BLADE) ×1 IMPLANT
COVER PERINEAL POST (MISCELLANEOUS) ×1 IMPLANT
COVER SURGICAL LIGHT HANDLE (MISCELLANEOUS) ×1 IMPLANT
CUP ACETAB W/GRIPTION 54 (Plate) IMPLANT
DERMABOND ADVANCED .7 DNX12 (GAUZE/BANDAGES/DRESSINGS) IMPLANT
DRAPE C-ARM 42X72 X-RAY (DRAPES) ×1 IMPLANT
DRAPE POUCH INSTRU U-SHP 10X18 (DRAPES) ×1 IMPLANT
DRAPE STERI IOBAN 125X83 (DRAPES) ×1 IMPLANT
DRAPE U-SHAPE 47X51 STRL (DRAPES) ×2 IMPLANT
DRSG AQUACEL AG ADV 3.5X10 (GAUZE/BANDAGES/DRESSINGS) ×1 IMPLANT
DURAPREP 26ML APPLICATOR (WOUND CARE) ×2 IMPLANT
ELECTRODE BLDE 4.0 EZ CLN MEGD (MISCELLANEOUS) ×1 IMPLANT
ELECTRODE REM PT RTRN 9FT ADLT (ELECTROSURGICAL) ×1 IMPLANT
GLOVE BIOGEL PI IND STRL 7.0 (GLOVE) ×2 IMPLANT
GLOVE BIOGEL PI IND STRL 7.5 (GLOVE) ×5 IMPLANT
GLOVE ECLIPSE 7.0 STRL STRAW (GLOVE) ×2 IMPLANT
GLOVE SKINSENSE STRL SZ7.5 (GLOVE) ×1 IMPLANT
GLOVE SURG SYN 7.5 E (GLOVE) ×2 IMPLANT
GLOVE SURG SYN 7.5 PF PI (GLOVE) ×2 IMPLANT
GLOVE SURG UNDER POLY LF SZ7 (GLOVE) ×3 IMPLANT
GLOVE SURG UNDER POLY LF SZ7.5 (GLOVE) ×2 IMPLANT
GOWN STRL REUS W/ TWL LRG LVL3 (GOWN DISPOSABLE) IMPLANT
GOWN STRL REUS W/ TWL XL LVL3 (GOWN DISPOSABLE) ×1 IMPLANT
GOWN STRL SURGICAL XL XLNG (GOWN DISPOSABLE) ×1 IMPLANT
GOWN TOGA ZIPPER T7+ PEEL AWAY (MISCELLANEOUS) ×1 IMPLANT
HEAD CERAMIC DELTA 36 PLUS 1.5 (Hips) IMPLANT
HOOD PEEL AWAY T7 (MISCELLANEOUS) ×1 IMPLANT
IV NS IRRIG 3000ML ARTHROMATIC (IV SOLUTION) ×1 IMPLANT
KIT BASIN OR (CUSTOM PROCEDURE TRAY) ×1 IMPLANT
LINER NEUTRAL 54X36MM PLUS 4 (Hips) IMPLANT
MARKER SKIN DUAL TIP RULER LAB (MISCELLANEOUS) ×1 IMPLANT
NDL SPNL 18GX3.5 QUINCKE PK (NEEDLE) ×1 IMPLANT
NEEDLE SPNL 18GX3.5 QUINCKE PK (NEEDLE) ×1 IMPLANT
PACK TOTAL JOINT (CUSTOM PROCEDURE TRAY) ×1 IMPLANT
PACK UNIVERSAL I (CUSTOM PROCEDURE TRAY) ×1 IMPLANT
SET HNDPC FAN SPRY TIP SCT (DISPOSABLE) ×1 IMPLANT
SOLUTION PRONTOSAN WOUND 350ML (IRRIGATION / IRRIGATOR) ×1 IMPLANT
STEM FEMORAL SZ6 HIGH ACTIS (Stem) IMPLANT
SUT ETHIBOND 2 V 37 (SUTURE) ×1 IMPLANT
SUT ETHILON 2 0 FS 18 (SUTURE) IMPLANT
SUT STRATAFIX PDS+ 0 24IN (SUTURE) IMPLANT
SUT VIC AB 0 CT1 27XBRD ANBCTR (SUTURE) ×1 IMPLANT
SUT VIC AB 1 CTX36XBRD ANBCTR (SUTURE) ×1 IMPLANT
SUT VIC AB 2-0 CT1 TAPERPNT 27 (SUTURE) ×2 IMPLANT
SYR 50ML LL SCALE MARK (SYRINGE) ×1 IMPLANT
TOWEL GREEN STERILE (TOWEL DISPOSABLE) ×1 IMPLANT
TRAY CATH INTERMITTENT SS 16FR (CATHETERS) IMPLANT
TRAY FOLEY W/BAG SLVR 16FR ST (SET/KITS/TRAYS/PACK) IMPLANT
TUBE SUCT ARGYLE STRL (TUBING) ×1 IMPLANT
YANKAUER SUCT BULB TIP NO VENT (SUCTIONS) ×1 IMPLANT

## 2023-08-15 NOTE — H&P (Signed)
 PREOPERATIVE H&P  Chief Complaint: RIGHT HIP OSTEOARTHRITIS  HPI: Taylor Heath is a 63 y.o. male who presents for surgical treatment of RIGHT HIP OSTEOARTHRITIS.  He denies any changes in medical history.  Past Surgical History:  Procedure Laterality Date  . COLONOSCOPY  2015  . FINGER SURGERY Left    index  . HAND SURGERY Left 04/04/2023  . HERNIA REPAIR  04/30/2022  . I & D EXTREMITY Left 01/27/2022   Procedure: IRRIGATION AND DEBRIDEMENT LEFT HAND;  Surgeon: Wes Hamman, MD;  Location: [facility] OR;  Service: Orthopedics;  Laterality: Left;  . I & D EXTREMITY Left 01/29/2022   Procedure: REPEAT IRRIGATION AND DEBRIDEMENT OF HAND;  Surgeon: Wes Hamman, MD;  Location: [facility] OR;  Service: Orthopedics;  Laterality: Left;  . JOINT REPLACEMENT     left hip replaced  . KNEE ARTHROSCOPY W/ ACL RECONSTRUCTION Right 2009  . LEG SURGERY Left    left calf  . MINOR APPLICATION OF WOUND VAC  01/29/2022   Procedure: APPLICATION OF WOUND VAC;  Surgeon: Wes Hamman, MD;  Location: [facility] OR;  Service: Orthopedics;;  . ORIF FINGER / THUMB FRACTURE    . TOTAL KNEE ARTHROPLASTY Right 01/27/2017   Procedure: RIGHT TOTAL KNEE ARTHROPLASTY;  Surgeon: Wes Hamman, MD;  Location: [facility] OR;  Service: Orthopedics;  Laterality: Right;   Social History   Socioeconomic History  . Marital status: Married    Spouse name: Not on file  . Number of children: Not on file  . Years of education: Not on file  . Highest education level: Not on file  Occupational History  . Not on file  Tobacco Use  . Smoking status: Former    Current packs/day: 0.00    Types: Cigarettes    Quit date: 04/21/2010    Years since quitting: 13.3    Passive exposure: Never  . Smokeless tobacco: Never  Vaping Use  . Vaping status: Never Used  Substance and Sexual Activity  . Alcohol use: No  . Drug use: No  . Sexual activity: Not on file  Other Topics Concern  . Not on file  Social History Narrative  . Not on file    Social Drivers of Health   Financial Resource Strain: Not on file  Food Insecurity: No Food Insecurity (01/28/2022)   Hunger Vital Sign   . Worried About Programme researcher, broadcasting/film/video in the Last Year: Never true   . Ran Out of Food in the Last Year: Never true  Transportation Needs: No Transportation Needs (01/28/2022)   PRAPARE - Transportation   . Lack of Transportation (Medical): No   . Lack of Transportation (Non-Medical): No  Physical Activity: Not on file  Stress: No Stress Concern Present (04/30/2022)   Received from [facility] - Occupational Stress Questionnaire   . Feeling of Stress : Not at all  Social Connections: Unknown (03/30/2022)   Received from [facility]   Social Network   . Social Network: Not on file   Family History  Problem Relation Age of Onset  . Rheum arthritis Father    Allergies  Allergen Reactions  . Tramadol  Nausea And Vomiting  . Meperidine     Unknown reaction    Prior to Admission medications   Medication Sig Start Date End Date Taking? Authorizing Provider  acetaminophen  (TYLENOL ) 325 MG tablet Take 650 mg by mouth every 6 (six) hours as needed for moderate pain.  Yes [provider]  diclofenac  (VOLTAREN ) 75 MG EC tablet TAKE 1/2 TABLET TWICE DAILY AS NEEDED 05/30/23  Yes Rice, Haig Levan, MD  folic acid  (FOLVITE ) 1 MG tablet TAKE 1 TABLET BY MOUTH EVERY DAY 07/27/23  Yes Rice, Haig Levan, MD  ibuprofen (ADVIL) 200 MG tablet Take 200 mg by mouth every 6 (six) hours as needed for moderate pain (pain score 4-6).   Yes [provider]  Multiple Vitamins-Minerals (MULTIVITAMIN PO) Take 1 tablet daily by mouth.   Yes [provider]  Omega-3 Fatty Acids (FISH OIL PO) Take 900 mg by mouth daily.   Yes [provider]  omeprazole  (PRILOSEC  OTC) 20 MG tablet Take 20 mg daily by mouth.   Yes [provider]  pravastatin  (PRAVACHOL ) 40 MG tablet Take 40 mg daily  by mouth. 11/01/16  Yes [provider]  valACYclovir  (VALTREX ) 500 MG tablet Take 500 mg daily by mouth. 11/03/16  Yes [provider]  vitamin C (ASCORBIC ACID) 250 MG tablet Take 250 mg by mouth daily.   Yes [provider]  HYDROcodone -acetaminophen  (NORCO/VICODIN) 5-325 MG tablet Take 1 tablet by mouth daily as needed for moderate pain (pain score 4-6). Patient not taking: Reported on 08/03/2023 05/18/23   Wes Hamman, MD  methotrexate  (RHEUMATREX) 2.5 MG tablet TAKE 6 TABLETS (15 MG TOTAL) BY MOUTH ONCE A WEEK. CAUTION:CHEMOTHERAPY. PROTECT FROM LIGHT. 08/12/23   Matt Song, MD  mupirocin  ointment (BACTROBAN ) 2 % Apply 1 Application topically daily. Patient not taking: Reported on 04/13/2023 02/05/22   Sandie Cross, PA-C     Positive ROS: All other systems have been reviewed and were otherwise negative with the exception of those mentioned in the HPI and as above.  Physical Exam: General: Alert, no acute distress Cardiovascular: No pedal edema Respiratory: No cyanosis, no use of accessory musculature GI: abdomen soft Skin: No lesions in the area of chief complaint Neurologic: Sensation intact distally Psychiatric: Patient is competent for consent with normal mood and affect Lymphatic: no lymphedema  MUSCULOSKELETAL: exam stable  Assessment: RIGHT HIP OSTEOARTHRITIS  Plan: Plan for Procedure(s): ARTHROPLASTY, HIP, TOTAL, ANTERIOR APPROACH  The risks benefits and alternatives were discussed with the patient including but not limited to the risks of nonoperative treatment, versus surgical intervention including infection, bleeding, nerve injury,  blood clots, cardiopulmonary complications, morbidity, mortality, among others, and they were willing to proceed.   Claria Crofts, MD 08/15/2023 5:51 AM

## 2023-08-15 NOTE — Evaluation (Signed)
 Physical Therapy Evaluation Patient Details Name: Taylor Heath MRN: [id number] DOB: 1960/03/17 Today's Date: 08/15/2023  History of Present Illness  PAtient is a 63 y/o male admitted 08/15/23 for R THA.  PMH positive fort TKA, GERD, BPH, shoulder arthritis.  Clinical Impression  Patient presents with decreased mobility due to pain and limited strength R LE.  Previously independent at home and able to mobilize with S to mod I with walker then crutches.  Obtained crutches for home per pt preference and demonstrated safe mobilizing.  Educated with handout on stair negotiation and pt without concern.  Feel stable for home with wife support and follow up as set up per MD.         If plan is discharge home, recommend the following: Assist for transportation;Help with stairs or ramp for entrance   Can travel by private vehicle        Equipment Recommendations Crutches  Recommendations for Other Services       Functional Status Assessment Patient has had a recent decline in their functional status and demonstrates the ability to make significant improvements in function in a reasonable and predictable amount of time.     Precautions / Restrictions Precautions Precautions: Fall Restrictions Weight Bearing Restrictions Per Provider Order: No Other Position/Activity Restrictions: WBAT      Mobility  Bed Mobility Overal bed mobility: Modified Independent                  Transfers Overall transfer level: Needs assistance Equipment used: Rolling walker (2 wheels), Crutches Transfers: Sit to/from Stand Sit to Stand: Supervision           General transfer comment: cues for technique with crutches, S for safety with walker    Ambulation/Gait Ambulation/Gait assistance: Supervision Gait Distance (Feet): 150 Feet (& 70' with crutches) Assistive device: Rolling walker (2 wheels) Gait Pattern/deviations: Step-to pattern, Step-through pattern, Decreased stance time -  right       General Gait Details: slow to progress R leg reports tenderness/thightness in front of hip from incision; with crutches cues for sequencing and S  Stairs Stairs:  (issued handout on stairs and educated in sequence, pt relates no problem on stairs, I can go up with my arms if I need to.)          Wheelchair Mobility     Tilt Bed    Modified Rankin (Stroke Patients Only)       Balance Overall balance assessment: Mild deficits observed, not formally tested                                           Pertinent Vitals/Pain Pain Assessment Pain Assessment: 0-10 Pain Score: 3  Pain Location: r hip Pain Descriptors / Indicators: Aching, Discomfort Pain Intervention(s): Monitored during session, Repositioned    Home Living Family/patient expects to be discharged to:: Private residence Living Arrangements: Spouse/significant other Available Help at Discharge: Family Type of Home: House Home Access: Stairs to enter Entrance Stairs-Rails: Right Entrance Stairs-Number of Steps: 3 Alternate Level Stairs-Number of Steps: 16 Home Layout: Two level Home Equipment: None;Shower seat - built in      Prior Function Prior Level of Function : Independent/Modified Independent;Driving;Working/employed               ADLs Comments: 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, plays golf     Extremity/Trunk  Assessment   Upper Extremity Assessment Upper Extremity Assessment: Overall WFL for tasks assessed    Lower Extremity Assessment Lower Extremity Assessment: RLE deficits/detail RLE Deficits / Details: limited hip flexion strength due to pain, AROM WFL    Cervical / Trunk Assessment Cervical / Trunk Assessment: Normal  Communication   Communication Communication: No apparent difficulties    Cognition Arousal: Alert Behavior During Therapy: WFL for tasks assessed/performed   PT - Cognitive impairments: No apparent impairments                          Following commands: Intact       Cueing Cueing Techniques: Verbal cues     General Comments General comments (skin integrity, edema, etc.): Educated with handout in HEP for R hip and pt relates understanding.  Ordered crutches for home, pt declined walker and states has crutches but in process of moving so not sure where they are.    Exercises Total Joint Exercises Ankle Circles/Pumps: AROM, 5 reps, Both, Supine Quad Sets: AROM, 5 reps, Right, Supine Heel Slides: AROM, 5 reps, Right, Supine   Assessment/Plan    PT Assessment All further PT needs can be met in the next venue of care  PT Problem List Decreased mobility;Decreased strength;Decreased safety awareness;Decreased knowledge of use of DME       PT Treatment Interventions      PT Goals (Current goals can be found in the Care Plan section)  Acute Rehab PT Goals PT Goal Formulation: All assessment and education complete, DC therapy    Frequency       Co-evaluation               AM-PAC PT 6 Clicks Mobility  Outcome Measure Help needed turning from your back to your side while in a flat bed without using bedrails?: None Help needed moving from lying on your back to sitting on the side of a flat bed without using bedrails?: None Help needed moving to and from a bed to a chair (including a wheelchair)?: None Help needed standing up from a chair using your arms (e.g., wheelchair or bedside chair)?: None Help needed to walk in hospital room?: None Help needed climbing 3-5 steps with a railing? : A Little 6 Click Score: 23    End of Session Equipment Utilized During Treatment: Gait belt Activity Tolerance: Patient tolerated treatment well Patient left: in chair;with nursing/sitter in room   PT Visit Diagnosis: Difficulty in walking, not elsewhere classified (R26.2)    Time: 1000-1042 PT Time Calculation (min) (ACUTE ONLY): 42 min   Charges:   PT Evaluation $PT Eval Low  Complexity: 1 Low PT Treatments $Gait Training: 8-22 mins PT General Charges $$ ACUTE PT VISIT: 1 Visit         Abigail Hoff, PT Acute Rehabilitation Services Office:[phone] 08/15/2023   Marley Simmers 08/15/2023, 10:57 AM

## 2023-08-15 NOTE — Transfer of Care (Signed)
 Immediate Anesthesia Transfer of Care Note  Patient: Taylor Heath  Procedure(s) Performed: ARTHROPLASTY, HIP, TOTAL, ANTERIOR APPROACH (Right: Hip)  Patient Location: PACU  Anesthesia Type:General  Level of Consciousness: awake, alert , and oriented  Airway & Oxygen Therapy: Patient Spontanous Breathing  Post-op Assessment: Report given to RN and Post -op Vital signs reviewed and stable  Post vital signs: Reviewed and stable  Last Vitals:  Vitals Value Taken Time  BP 140/87 08/15/23 09:10  Temp    Pulse 86 08/15/23 09:12  Resp 17 08/15/23 09:10  SpO2 98 % 08/15/23 09:12  Vitals shown include unfiled device data.  Last Pain:  Vitals:   08/15/23 0606  TempSrc: Oral  PainSc:          Complications: No notable events documented.

## 2023-08-15 NOTE — Anesthesia Postprocedure Evaluation (Signed)
 Anesthesia Post Note  Patient: Taylor Heath  Procedure(s) Performed: ARTHROPLASTY, HIP, TOTAL, ANTERIOR APPROACH (Right: Hip)     Patient location during evaluation: PACU Anesthesia Type: Spinal and General Level of consciousness: awake and alert Pain management: pain level controlled Vital Signs Assessment: post-procedure vital signs reviewed and stable Respiratory status: spontaneous breathing and respiratory function stable Cardiovascular status: blood pressure returned to baseline and stable Postop Assessment: spinal receding and no apparent nausea or vomiting Anesthetic complications: no Comments: Spinal did not set up requiring conversion to GA    No notable events documented.  Last Vitals:  Vitals:   08/15/23 0930 08/15/23 0945  BP: 117/78 121/82  Pulse: 75 71  Resp: 13 17  Temp:    SpO2: 93% 92%                  Juventino Oppenheim

## 2023-08-15 NOTE — Anesthesia Procedure Notes (Signed)
 Spinal  Patient location during procedure: OR Start time: 08/15/2023 7:18 AM End time: 08/15/2023 7:21 AM Reason for block: surgical anesthesia Staffing Performed: anesthesiologist  Anesthesiologist: Juventino Oppenheim, MD Performed by: Juventino Oppenheim, MD Authorized by: Juventino Oppenheim, MD   Preanesthetic Checklist Completed: patient identified, IV checked, risks and benefits discussed, surgical consent, monitors and equipment checked, pre-op evaluation and timeout performed Spinal Block Patient position: sitting Prep: DuraPrep Patient monitoring: heart rate, cardiac monitor, continuous pulse ox and blood pressure Approach: midline Location: L3-4 Injection technique: single-shot Needle Needle type: Pencan  Needle gauge: 24 G Additional Notes Consent was obtained prior to the procedure with all questions answered and concerns addressed. Risks including, but not limited to, bleeding, infection, nerve damage, paralysis, failed block, inadequate analgesia, allergic reaction, high spinal, itching, and headache were discussed and the patient wished to proceed. Functioning IV was confirmed and monitors were applied. Sterile prep and drape, including hand hygiene, mask, and sterile gloves were used. The patient was positioned and the spine was prepped. The skin was anesthetized with lidocaine . Free flow of clear CSF was obtained prior to injecting local anesthetic into the CSF. The spinal needle aspirated freely following injection. The needle was carefully withdrawn. The patient tolerated the procedure well.   Hobart Lulas, MD

## 2023-08-15 NOTE — Op Note (Addendum)
 ARTHROPLASTY, HIP, TOTAL, ANTERIOR APPROACH  Procedure Note WINDEL KEZIAH   [id number]  Pre-op Diagnosis: RIGHT HIP OSTEOARTHRITIS     Post-op Diagnosis: same  Operative Findings Severe DJD Leg length discrepancy   Operative Procedures  1. Total hip replacement; Right hip; uncemented [id number]   Surgeon: Dyana Glade, M.D.  Assist: Sharran Decent, PA-C   Anesthesia: general  Prosthesis: Depuy Acetabulum: Pinnacle 54 mm Femur: Actis 6 high offset Head: 36 mm size: +1.5 Liner: +4 neutral Bearing Type: ceramic/poly  Total Hip Arthroplasty (Anterior Approach) Op Note:  After informed consent was obtained and the operative extremity marked in the holding area, the patient was brought back to the operating room and placed supine on the HANA table. Next, the operative extremity was prepped and draped in normal sterile fashion. Surgical timeout occurred verifying patient identification, surgical site, surgical procedure and administration of antibiotics.  A bikini incision was made.  A Hueter approach to the hip was performed, using the interval between tensor fascia lata and sartorius.  Dissection was carried bluntly down onto the anterior hip capsule. The lateral femoral circumflex vessels were identified and coagulated. A capsulotomy was performed and the capsular flaps tagged for later repair.  The neck osteotomy was performed 1 fingerbreadth above the lesser trochanter. The femoral head was removed which showed severe wear, the acetabular rim was cleared of soft tissue and osteophytes and attention was turned to reaming the acetabulum.  Sequential reaming was performed under fluoroscopic guidance down to the floor of the cotyloid fossa. We reamed to a size 53 mm, and then impacted the acetabular shell. A +4 neutral liner was then placed after irrigation and attention turned to the femur.  After placing the femoral hook, the leg was taken to externally rotated, extended and  adducted position taking care to perform soft tissue releases to allow for adequate mobilization of the femur. Soft tissue was cleared from the shoulder of the greater trochanter and the hook elevator used to improve exposure of the proximal femur.  Lateral bone from the shoulder was rasped away for relief.  Sequential broaching performed up to a size 6.  Standard trial neck and +1.5 head were placed. The leg was brought back up to neutral and the construct reduced.  The position and sizing of components, offset and leg lengths were checked using fluoroscopy. Stability of the construct was checked in 45 degrees of hip extension and 90 degrees of external rotation without any subluxation, shuck or impingement of prosthesis. We dislocated the prosthesis, dropped the leg back into position, removed trial components, and irrigated copiously. The final stem and head was then placed, the leg brought back up, the system reduced and fluoroscopy used to verify positioning.  Antibiotic irrigation was placed in the surgical wound.   We irrigated, obtained hemostasis and closed the capsule using #2 ethibond suture.  A topical mixture of 0.25% bupivacaine  and meloxicam was placed deep to the fascia.  One gram of vancomycin  powder was placed in the surgical bed.   One gram of topical tranexamic acid  was injected into the joint.  The fascia was closed with #1 stratafix, the deep fat layer was closed with 0 vicryl, the subcutaneous layers closed with 2.0 Vicryl Plus and the skin closed with 2.0 nylon and dermabond. A sterile dressing was applied. The patient was awakened in the operating room and taken to recovery in stable condition.  All sponge, needle, and instrument counts were correct at the end of the case.  Trellis Fries, my PA, was a medical necessity for opening, closing, limb positioning, retracting, exposing, and overall facilitation and timely completion of the surgery.  Position: supine  Complications: see  description of procedure.  Time Out: performed   Drains/Packing: none  Estimated blood loss: see anesthesia record  Returned to Recovery Room: in good condition.   Antibiotics: yes   Mechanical VTE (DVT) Prophylaxis: sequential compression devices, TED thigh-high  Chemical VTE (DVT) Prophylaxis: aspirin    Fluid Replacement: see anesthesia record  Specimens Removed: 1 to pathology   Sponge and Instrument Count Correct? yes   PACU: portable radiograph - low AP   Plan/RTC: Return in 2 weeks for suture removal. Weight Bearing/Load Lower Extremity: full  Hip precautions: none Suture Removal: 2 weeks   N. Claria Crofts, MD [facility] 10:42 AM   Implant Name Type Inv. Item Serial No. Manufacturer Lot No. LRB No. Used Action  CUP ACETAB W/GRIPTION 54 - [id number] Plate CUP ACETAB W/GRIPTION 54  [employer] [id number] Right 1 Implanted  LINER NEUTRAL 54X36MM PLUS 4 - [id number] Hips LINER NEUTRAL 54X36MM PLUS 4  [employer] [id number] Right 1 Implanted  STEM FEMORAL SZ6 HIGH ACTIS - [id number] Stem STEM FEMORAL SZ6 HIGH ACTIS  [employer] [id number] Right 1 Implanted  HEAD CERAMIC DELTA 36 PLUS 1.5 - [id number] Hips HEAD CERAMIC DELTA 36 PLUS 1.5  [employer] [id number] Right 1 Implanted

## 2023-08-15 NOTE — Progress Notes (Signed)
 Orthopedic Tech Progress Note Patient Details:  Taylor Heath 1960/12/24 [id number]  Ortho Devices Type of Ortho Device: Crutches Ortho Device/Splint Interventions: Ordered, Adjustment   Post Interventions Patient Tolerated: Well, Ambulated well Instructions Provided: Adjustment of device, Poper ambulation with device Paged by PACU to deliver crutches. Ortho tech delivered crutches. While there, PT/OT taught patient how to use crutches and patient ambulated well.   [name] 08/15/2023, 10:42 AM

## 2023-08-15 NOTE — Anesthesia Procedure Notes (Signed)
 Procedure Name: LMA Insertion Date/Time: 08/15/2023 7:51 AM  Performed by: Artemisa Bile D, CRNAPre-anesthesia Checklist: Patient identified, Emergency Drugs available, Suction available and Patient being monitored Patient Re-evaluated:Patient Re-evaluated prior to induction Oxygen Delivery Method: Circle System Utilized Preoxygenation: Pre-oxygenation with 100% oxygen Induction Type: IV induction Ventilation: Mask ventilation without difficulty LMA: LMA inserted LMA Size: 4.0 Number of attempts: 1 Airway Equipment and Method: Bite block Placement Confirmation: positive ETCO2 Tube secured with: Tape Dental Injury: Teeth and Oropharynx as per pre-operative assessment

## 2023-08-15 NOTE — Discharge Instructions (Signed)

## 2023-08-15 NOTE — Progress Notes (Signed)
 This RN called into the OR to ask Dr. Christiane Cowing about whether or not Ancef  and TXA needed to be administered before patient being discharged from PACU. Per MD, give a dose of ancef  in one hour (around 1100) and then patient can be discharged home- no need for additional dose of TXA.

## 2023-08-16 ENCOUNTER — Encounter (HOSPITAL_COMMUNITY): Payer: Self-pay | Admitting: Orthopaedic Surgery

## 2023-08-21 ENCOUNTER — Other Ambulatory Visit: Payer: Self-pay | Admitting: Internal Medicine

## 2023-08-21 DIAGNOSIS — M059 Rheumatoid arthritis with rheumatoid factor, unspecified: Secondary | ICD-10-CM

## 2023-08-22 NOTE — Telephone Encounter (Signed)
 Last Fill: 003/31/2025  Labs: 08/08/2023 CBC and BMP Glucose 105  Next Visit: 10/11/2023  Last Visit: 04/13/2023  DX: Seropositive rheumatoid arthritis (HCC)   Current Dose per office note 04/13/2023: diclofenac  1/2 tablet 1-2 daily   Okay to refill Diclofenac ?

## 2023-08-24 ENCOUNTER — Other Ambulatory Visit (HOSPITAL_COMMUNITY): Payer: Self-pay

## 2023-08-30 ENCOUNTER — Encounter: Admitting: Physician Assistant

## 2023-09-05 ENCOUNTER — Other Ambulatory Visit: Payer: Self-pay | Admitting: Internal Medicine

## 2023-09-05 DIAGNOSIS — M059 Rheumatoid arthritis with rheumatoid factor, unspecified: Secondary | ICD-10-CM

## 2023-09-05 NOTE — Telephone Encounter (Signed)
 Last Fill: 08/12/2023  Labs: 08/08/2023 Glucose 105 CBC and  rest BMP WNL   Next Visit: 10/11/2023  Last Visit: 04/13/2023  DX: Seropositive rheumatoid arthritis   Current Dose per office note 04/13/2023: methotrexate  15 mg p.o. weekly   Okay to refill Methotrexate ?

## 2023-09-06 ENCOUNTER — Ambulatory Visit (INDEPENDENT_AMBULATORY_CARE_PROVIDER_SITE_OTHER): Admitting: Physician Assistant

## 2023-09-06 DIAGNOSIS — Z96641 Presence of right artificial hip joint: Secondary | ICD-10-CM

## 2023-09-06 MED ORDER — DOXYCYCLINE HYCLATE 100 MG PO TABS: 100.0000 mg | ORAL_TABLET | Freq: Two times a day (BID) | ORAL | 0 refills | Status: AC

## 2023-09-06 NOTE — Progress Notes (Addendum)
 Post-Op Visit Note   Patient: Taylor Heath           Date of Birth: 05/25/1960           MRN: [id number] Visit Date: 09/06/2023 PCP: Bertell Satterfield, MD   Assessment & Plan:  Chief Complaint:  Chief Complaint  Patient presents with   Right Hip - Follow-up    Right total hip arthroplasty 08/15/2023   Visit Diagnoses:  1. Status post total replacement of right hip     Plan: Patient is a pleasant 63 year old gentleman who comes in today 3 weeks status post right total hip replacement 08/15/2023.  He has been having mostly soreness to the right thigh and some to the left hip.  He has been taking Robaxin  for this which seems to be helping the most.  He has been compliant taking aspirin  twice daily for DVT prophylaxis.  He recently restarted his methotrexate .  Examination of his right hip reveals a well-healing surgical incision with the exception of a small pea-sized area to the mid incision.  No drainage or surrounding erythema or cellulitis.  Calves are soft nontender.  He is neurovascularly intact distally.  Today, sutures were removed and Steri-Strips applied.  The middle area of the incision was covered with mupirocin  and a Band-Aid.  He will do this twice daily until it has healed.  He will continue with his baby aspirin  twice daily until he is 4 weeks out from surgery.  He will follow-up with us  in 4 weeks for repeat evaluation and AP pelvis x-rays.  Call if concerns or questions.  Follow-Up Instructions: Return in about 4 weeks (around 10/04/2023).   Orders:  No orders of the defined types were placed in this encounter.  Meds ordered this encounter  Medications   doxycycline  (VIBRA -TABS) 100 MG tablet    Sig: Take 1 tablet (100 mg total) by mouth 2 (two) times daily. To be taken after surgery    Dispense:  28 tablet    Refill:  0    Imaging: No results found.  PMFS History: Patient Active Problem List   Diagnosis Date Noted   Primary osteoarthritis of right hip  08/15/2023   Status post total replacement of right hip 08/15/2023   Umbilical hernia without obstruction and without gangrene 04/07/2022   Rectus diastasis 04/07/2022   Seropositive rheumatoid arthritis (HCC) 02/12/2022   High risk medication use 02/12/2022   Abscess of dorsum of left hand 01/26/2022   Extensor tenosynovitis of left wrist 12/01/2021   Primary osteoarthritis of right wrist 04/26/2017   Radiculopathy of cervical spine 04/26/2017   Total knee replacement status 01/27/2017   Primary osteoarthritis of right shoulder 12/30/2016   Primary osteoarthritis of right knee 12/30/2016   Insomnia 03/10/2012   BPH (benign prostatic hyperplasia) 12/09/2011   GERD (gastroesophageal reflux disease) 12/09/2011   Past Medical History:  Diagnosis Date   Anxiety    Arthritis    GERD (gastroesophageal reflux disease)    History of kidney stones    Pneumonia    Rheumatoid arthritis (HCC)     Family History  Problem Relation Age of Onset   Rheum arthritis Father     Past Surgical History:  Procedure Laterality Date   COLONOSCOPY  2015   FINGER SURGERY Left    index   HAND SURGERY Left 04/04/2023   HERNIA REPAIR  04/30/2022   I & D EXTREMITY Left 01/27/2022   Procedure: IRRIGATION AND DEBRIDEMENT LEFT HAND;  Surgeon: Jerri Moody  M, MD;  Location: [facility] OR;  Service: Orthopedics;  Laterality: Left;   I & D EXTREMITY Left 01/29/2022   Procedure: REPEAT IRRIGATION AND DEBRIDEMENT OF HAND;  Surgeon: Jerri Kay HERO, MD;  Location: [facility] OR;  Service: Orthopedics;  Laterality: Left;   JOINT REPLACEMENT     left hip replaced   KNEE ARTHROSCOPY W/ ACL RECONSTRUCTION Right 2009   LEG SURGERY Left    left calf   MINOR APPLICATION OF WOUND VAC  01/29/2022   Procedure: APPLICATION OF WOUND VAC;  Surgeon: Jerri Kay HERO, MD;  Location: [facility] OR;  Service: Orthopedics;;   ORIF FINGER / THUMB FRACTURE     TOTAL HIP ARTHROPLASTY Right 08/15/2023   Procedure: ARTHROPLASTY, HIP, TOTAL, ANTERIOR APPROACH;   Surgeon: Jerri Kay HERO, MD;  Location: [facility] OR;  Service: Orthopedics;  Laterality: Right;  3-C   TOTAL KNEE ARTHROPLASTY Right 01/27/2017   Procedure: RIGHT TOTAL KNEE ARTHROPLASTY;  Surgeon: Jerri Kay HERO, MD;  Location: [facility] OR;  Service: Orthopedics;  Laterality: Right;   Social History   Occupational History   Not on file  Tobacco Use   Smoking status: Former    Current packs/day: 0.00    Types: Cigarettes    Quit date: 04/21/2010    Years since quitting: 13.3    Passive exposure: Never   Smokeless tobacco: Never  Vaping Use   Vaping status: Never Used  Substance and Sexual Activity   Alcohol use: No   Drug use: No   Sexual activity: Not on file

## 2023-09-13 ENCOUNTER — Other Ambulatory Visit (HOSPITAL_COMMUNITY): Payer: Self-pay

## 2023-09-17 ENCOUNTER — Other Ambulatory Visit: Payer: Self-pay | Admitting: Physician Assistant

## 2023-09-19 NOTE — Discharge Summary (Addendum)
 Patient ID: Taylor Heath MRN: [id number] DOB/AGE: 07/18/1960 63 y.o.  Admit date: 08/15/2023 Discharge date: 08/15/2023  Admission Diagnoses:  Primary osteoarthritis of right hip  Discharge Diagnoses:  Principal Problem:   Primary osteoarthritis of right hip Active Problems:   Status post total replacement of right hip   Past Medical History:  Diagnosis Date   Anxiety    Arthritis    GERD (gastroesophageal reflux disease)    History of kidney stones    Pneumonia    Rheumatoid arthritis (HCC)     Surgeries: Procedure(s): ARTHROPLASTY, HIP, TOTAL, ANTERIOR APPROACH on 08/15/2023   Consultants (if any):   Discharged Condition: Improved  Hospital Course: Taylor Heath is an 63 y.o. male who was admitted 08/15/2023 with a diagnosis of Primary osteoarthritis of right hip and went to the operating room on 08/15/2023 and underwent the above named procedures.    He was given perioperative antibiotics:  Anti-infectives (From admission, onward)    Start     Dose/Rate Route Frequency Ordered Stop   08/15/23 1300  ceFAZolin  (ANCEF ) IVPB 2g/100 mL premix  Status:  Discontinued        2 g 200 mL/hr over 30 Minutes Intravenous Every 6 hours 08/15/23 0935 08/15/23 1944   08/15/23 0758  vancomycin  (VANCOCIN ) powder  Status:  Discontinued          As needed 08/15/23 0759 08/15/23 0905   08/15/23 0600  ceFAZolin  (ANCEF ) IVPB 2g/100 mL premix        2 g 200 mL/hr over 30 Minutes Intravenous On call to O.R. 08/15/23 0540 08/15/23 0731     .  He was given sequential compression devices, early ambulation, and appropriate chemoprophylaxis for DVT prophylaxis.  He benefited maximally from the hospital stay and there were no complications.    Recent vital signs:  Vitals:   08/15/23 1115 08/15/23 1130  BP: 116/81 119/71  Pulse: (!) 59 (!) 57  Resp: 14 15  Temp:  (!) 97.5 F (36.4 C)  SpO2: 93% 95%    Recent laboratory studies:  Lab Results  Component Value Date   HGB  14.5 08/08/2023   HGB 14.8 05/19/2023   HGB 15.2 01/11/2023   Lab Results  Component Value Date   WBC 4.8 08/08/2023   PLT 306 08/08/2023   Lab Results  Component Value Date   INR 0.96 01/19/2017   Lab Results  Component Value Date   NA 139 08/08/2023   K 4.3 08/08/2023   CL 105 08/08/2023   CO2 26 08/08/2023   BUN 16 08/08/2023   CREATININE 0.76 08/08/2023   GLUCOSE 105 (H) 08/08/2023    Discharge Medications:   Allergies as of 08/15/2023       Reactions   Tramadol  Nausea And Vomiting   Meperidine    Unknown reaction         Medication List     STOP taking these medications    acetaminophen  325 MG tablet Commonly known as: TYLENOL    acetaminophen  500 MG tablet Commonly known as: TYLENOL    diclofenac  75 MG EC tablet Commonly known as: VOLTAREN    FISH OIL PO   HYDROcodone -acetaminophen  5-325 MG tablet Commonly known as: NORCO/VICODIN   ibuprofen 200 MG tablet Commonly known as: ADVIL   mupirocin  ointment 2 % Commonly known as: BACTROBAN        TAKE these medications    Aspirin  Low Dose 81 MG chewable tablet Generic drug: aspirin  Chew 1 tablet (81 mg total) by  mouth 2 (two) times daily. To be taken after surgery to prevent blood clots   docusate sodium  100 MG capsule Commonly known as: Colace Take 1 capsule (100 mg total) by mouth daily as needed.   folic acid  1 MG tablet Commonly known as: FOLVITE  TAKE 1 TABLET BY MOUTH EVERY DAY   MULTIVITAMIN PO Take 1 tablet daily by mouth.   omeprazole  20 MG tablet Commonly known as: PRILOSEC  OTC Take 20 mg daily by mouth.   ondansetron  4 MG tablet Commonly known as: Zofran  Take 1 tablet (4 mg total) by mouth every 8 (eight) hours as needed for nausea or vomiting.   oxyCODONE -acetaminophen  5-325 MG tablet Commonly known as: Percocet Take 1-2 tablets by mouth every 6 (six) hours as needed.   pravastatin  40 MG tablet Commonly known as: PRAVACHOL  Take 40 mg daily by mouth.   valACYclovir   500 MG tablet Commonly known as: VALTREX  Take 500 mg daily by mouth.   vitamin C 250 MG tablet Commonly known as: ASCORBIC ACID Take 250 mg by mouth daily.        Diagnostic Studies: No results found.  Disposition: Discharge disposition: 01-Home or Self Care          Follow-up Information     Jule Ronal CROME, PA-C. Schedule an appointment as soon as possible for a visit in 2 week(s).   Specialty: Orthopedic Surgery Contact information: [address] [place]  [place] [address] [phone]                  Signed: Ozell Cummins 09/19/2023, 6:46 PM

## 2023-09-30 NOTE — Progress Notes (Signed)
 Office Visit Note  Patient: Taylor Heath             Date of Birth: [birth year]           MRN: [id number]             PCP: Bertell Satterfield, MD Referring: Bertell Satterfield, MD Visit Date: 10/11/2023   Subjective:  Follow-up    Discussed the use of AI scribe software for clinical note transcription with the patient, who gave verbal consent to proceed.  History of Present Illness   Taylor Heath is a 63 year old male with seropositive rheumatoid arthritis on methotrexate  15 mg p.o. weekly folic acid  1 mg daily.  He also complains with symptoms of sciatic nerve pain also with carpal tunnel syndrome pain and numbness affecting his right hand.  He experiences significant wrist pain, particularly in the right wrist, due to a combination of carpal tunnel syndrome and arthritis. The pain is characterized by numbness, tingling, and both sharp and dull sensations, exacerbated by activities such as using his phone and sleeping. He has a history of surgeries on his hands and wrists, with the right hand previously severely affected, leading to immobility of the fingers. Cortisone injections in the past have provided relief.  He last saw Dr. Gladis in July 16 with recommendation that he was having active inflammation with severe arthritis in the wrist but not interested in pursuing any surgical procedure at that time.  He reports a flare-up of sciatic nerve pain that began about six days ago after playing golf. The pain is severe, similar to a previous episode that required an emergency room visit four years ago. It has been persistent and difficult to manage over the past week.  He underwent hip surgery on August 15, 2023, to correct a leg length discrepancy. Post-surgery, he reports improved mobility and was initially able to play golf without pain.  He paused methotrexate  use around the time of his hip surgery and resumed it approximately two and a half weeks post-surgery.   Previous  HPI 04/13/2023 Taylor Heath is a 63 y.o. male here for follow up for seropositive rheumatoid arthritis with complication of left wrist extensor tendon rupture. Now on methotrexate  15 mg p.o. weekly folic acid  1 mg daily but has held medication for weeks for surgery on 04/04/23.     He is experiencing postoperative swelling in his forearm following recent tendon surgery. The swelling is not severe, as he can still wear his ring, and there is no redness or pain. He has been massaging his fingers to aid circulation and is cautious not to overwork the area. Certain movements cause pain, especially those that put tension on the tendon area where surgery was performed.   He has been off methotrexate  since before his surgery and is inquiring about when to resume it. He is currently completing a course of amoxicillin , which will finish on Sunday.He started this due to right sided jaw and facial pain he is not sure if this was from his tooth or possible sinus pain. No current pain in his tooth, which he initially thought was the source of his discomfort, and he can now chew without issue.    He recently returned from a trip to [facility], where he played golf extensively, leading to significant aches and pains. He describes feeling lethargic and experiencing aches and pains, which he attributes to a possible mild flu. No congestion and states that his sinus symptoms have resolved.  01/11/2023 Taylor Heath is a 63 y.o. male here for follow up for seropositive rheumatoid arthritis with complication of left wrist extensor tendon rupture. Now on methotrexate  15 mg p.o. weekly folic acid  1 mg daily.  He has also been taking diclofenac  75 mg half a tablet twice daily.  He has not experienced any major change in joint pain or mobility.  Left wrist is still limited in extension.  He has been getting increased swelling near the base of the thumb not associated with any pain redness or warmth in that area.   He followed up with Dr. Jerri did not do any new procedure did recommend considering hand surgical specialist consultation.  He is concerned about this affecting his left wrist and thumb movement worse which would limit golfing.   10/05/2022 Taylor Heath is a 63 y.o. male here for follow up for seropositive rheumatoid arthritis with complication of left wrist extensor tendon rupture. Now on methotrexate  15 mg p.o. weekly folic acid  1 mg daily.  He has also been taking diclofenac  one half of a 75 mg tablet twice daily.  Feels arthritis symptoms are well-controlled not having significant morning joint pain or stiffness.  He has been staying physically active usually with golf 2 to 3 days/week feels his leg strength and mobility is a little bit improved.  Still has small amount of swelling around the left wrist and thumb but decreased compared to our last visit.   06/30/2022  Taylor Heath is a 63 y.o. male here for follow up for seropositive rheumatoid arthritis with complication of left wrist extensor tendon rupture.  Now on methotrexate  15 mg p.o. weekly folic acid  1 mg daily has not noticed any particular drug side effects.  He had some swelling in his right hand and wrist that improved without any additional intervention.  Still has some restriction in range of motion at the left hand and wrist where he has surgical scars on the dorsal side of the hand.  Not causing him any particular functional limitation and not very painful.  He is asking about the folic acid  supplementation as he is also on a daily multivitamin that he thinks contains 600 mcg of folic acid .   04/13/22 Taylor Heath is a 63 y.o. male here for follow up for rheumatoid arthritis with complication of left wrist extensor tendon rupture and prolonged recovery from surgical repair due to localized infection.  He finished his oral doxycycline  completely over the interval with no additional infectious disease follow-up scheduled.  He is  working with occupational therapy on his extension he is almost able to achieve a full movement on the third and fourth finger.  No additional swelling and surgical scar is well-healed.  He has noticed some more pain in his right wrist that he attributes to relying more heavily on the 1 hand due to the prolonged immobilization and recovery on his left side.  He also has carpal tunnel syndrome which may be contributing.  He is scheduled for umbilical hernia repair surgery on the 20th.   02/12/22 Taylor Heath is a 63 y.o. male here for seropositive RA. He previously saw Dr. Selma in 2016 without active disease noted at that time persistent positive CCP Abs.  He was never started on any long-term DMARD.  He has had some degree of chronic joint pain at the hands mostly related to first [facility] joint osteoarthritis.  Otherwise also with some chronic osteoarthritis symptoms affecting the knees and hips.  Recently with complications of tenosynovitis and hand deformity complicated by extensor tendon damage progressing towards possible rupture. Surgical repair with tenolysis was complicated recently with surgical site infection with staph aureus requiring debridement and subsequent prolonged antibiotic on linezolid  then doxycycline  planned for next month.  Currently symptoms are pretty much at baseline excepting the left wrist which has some stiffness and decreased range of motion with the ongoing healing dorsal surgical wound.  He has a family history his father had severe deforming rheumatoid arthritis.   Labs reviewed 10/2014 RF 106 CCP 34   Imaging reviewed MRI Left Wrist 11/26/21 IMPRESSION: 1. Moderate to high-grade fourth and fifth, moderate second and third, and mild to moderate 6th dorsal extensor compartment tenosynovitis. There is scattered debris and some thin internal septations within the tendon sheath fluid indicating mild complexity. 2. Multiple interstitial tears within the fourth and fifth  extensor compartment tendons. Moderate extensor carpi ulnaris tendinosis with small interstitial tears. 3. Possible partial-thickness tear of the distal peripheral triangular fibrocartilage complex. 4. Possible partial-thickness tear of the proximal aspect of the lunotriquetral ligament. 5. Moderate to severe triscaphe, moderate thumb carpometacarpal, and moderate lunotriquetral cartilage degenerative changes. Scattered carpal subchondral cysts.   Bilateral hand xrays 03/2020 multiple carpal cysts, mild to moderate 1st [facility] joint osteoarthritis   Review of Systems  Constitutional:  Negative for fatigue.  HENT:  Negative for mouth sores and mouth dryness.   Eyes:  Negative for dryness.  Respiratory:  Negative for shortness of breath.   Cardiovascular:  Negative for chest pain and palpitations.  Gastrointestinal:  Negative for blood in stool, constipation and diarrhea.  Endocrine: Negative for increased urination.  Genitourinary:  Negative for involuntary urination.  Musculoskeletal:  Positive for joint pain, joint pain, joint swelling, morning stiffness and muscle tenderness. Negative for gait problem, myalgias, muscle weakness and myalgias.  Skin:  Negative for color change, rash, hair loss and sensitivity to sunlight.  Allergic/Immunologic: Negative for susceptible to infections.  Neurological:  Negative for dizziness and headaches.  Hematological:  Negative for swollen glands.  Psychiatric/Behavioral:  Negative for depressed mood and sleep disturbance. The patient is not nervous/anxious.     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/15/2023   Umbilical hernia without obstruction and without gangrene 04/07/2022   Rectus diastasis 04/07/2022   Seropositive rheumatoid arthritis (HCC) 02/12/2022   High risk  medication use 02/12/2022   Abscess of dorsum of left hand 01/26/2022   Extensor tenosynovitis of left wrist 12/01/2021   Primary osteoarthritis of right wrist 04/26/2017   Radiculopathy of cervical spine 04/26/2017   Total knee replacement status 01/27/2017   Primary osteoarthritis of right shoulder 12/30/2016   Primary osteoarthritis of right knee 12/30/2016   Insomnia 03/10/2012   BPH (benign prostatic hyperplasia) 12/09/2011   GERD (gastroesophageal reflux disease) 12/09/2011    Past Medical History:  Diagnosis Date   Anxiety    Arthritis    GERD (gastroesophageal reflux disease)    History of kidney stones    Pneumonia    Rheumatoid arthritis (HCC)     Family History  Problem Relation Age of Onset   Rheum arthritis Father    Past Surgical History:  Procedure Laterality Date   COLONOSCOPY  2015   FINGER SURGERY Left    index   HAND SURGERY Left 04/04/2023  HERNIA REPAIR  04/30/2022   I & D EXTREMITY Left 01/27/2022   Procedure: IRRIGATION AND DEBRIDEMENT LEFT HAND;  Surgeon: Jerri Kay HERO, MD;  Location: [facility] OR;  Service: Orthopedics;  Laterality: Left;   I & D EXTREMITY Left 01/29/2022   Procedure: REPEAT IRRIGATION AND DEBRIDEMENT OF HAND;  Surgeon: Jerri Kay HERO, MD;  Location: [facility] OR;  Service: Orthopedics;  Laterality: Left;   JOINT REPLACEMENT     left hip replaced   KNEE ARTHROSCOPY W/ ACL RECONSTRUCTION Right 2009   LEG SURGERY Left    left calf   MINOR APPLICATION OF WOUND VAC  01/29/2022   Procedure: APPLICATION OF WOUND VAC;  Surgeon: Jerri Kay HERO, MD;  Location: [facility] OR;  Service: Orthopedics;;   ORIF FINGER / THUMB FRACTURE     TOTAL HIP ARTHROPLASTY Right 08/15/2023   Procedure: ARTHROPLASTY, HIP, TOTAL, ANTERIOR APPROACH;  Surgeon: Jerri Kay HERO, MD;  Location: [facility];  Service: Orthopedics;  Laterality: Right;  3-C   TOTAL KNEE ARTHROPLASTY Right 01/27/2017   Procedure: RIGHT TOTAL KNEE ARTHROPLASTY;  Surgeon: Jerri Kay HERO, MD;  Location: [facility] OR;   Service: Orthopedics;  Laterality: Right;   Social History   Social History Narrative   Not on file    There is no immunization history on file for this patient.   Objective: Vital Signs: BP (!) 150/80 (BP Location: Left Arm, Patient Position: Sitting, Cuff Size: Normal)   Pulse 71   Resp 16   Ht 5' 9 (1.753 m)   Wt 166 lb 6.4 oz (75.5 kg)   BMI 24.57 kg/m    Physical Exam Eyes:     Conjunctiva/sclera: Conjunctivae normal.  Cardiovascular:     Rate and Rhythm: Normal rate and regular rhythm.  Pulmonary:     Effort: Pulmonary effort is normal.     Breath sounds: Normal breath sounds.  Musculoskeletal:     Right lower leg: No edema.     Left lower leg: No edema.  Skin:    General: Skin is warm and dry.     Findings: No rash.  Neurological:     Mental Status: He is alert.  Psychiatric:        Mood and Affect: Mood normal.      Musculoskeletal Exam:  Shoulders full ROM no tenderness or swelling Elbows full ROM no tenderness or swelling Right wrist with palpable swelling on the flexor side more over ulnar than radius, slight restriction in flexion extension range of motion, mild tenderness to pressure and with percussion over carpal tunnel Fingers full ROM no tenderness or swelling Knees full ROM no tenderness or swelling  Investigation: No additional findings.  Imaging: US  Guided Needle Placement Result Date: 10/11/2023 Right wrist carpal tunnel injection Ultrasound guided injection is preferred based studies that show increased duration, increased effect, greater accuracy, decreased procedural pain, increased response rate, and decreased cost with ultrasound guided versus blind injection. Verbal informed consent obtained.  Time-out conducted.  Noted no overlying erythema, induration, or other signs of local infection. Ultrasound-guided right carpal tunnel injection. After sterile prep with Betadine , injected 1 mL 1% lidocaine  and 40 mg kenalog  using a 27g needle by  ulnar approach. Image 1-2 show advancement of needle next to median nerve. Images 3-7 show injection of medication into carpal tunnel space with additional needle advancement deep to the nerve.   XR Knee 1-2 Views Right Result Date: 10/04/2023 X-rays of the right knee demonstrate a stable right total knee replacement in good  alignment   XR Pelvis 1-2 Views Result Date: 10/04/2023 Stable total hip replacement without complications   Recent Labs: Lab Results  Component Value Date   WBC 4.8 08/08/2023   HGB 14.5 08/08/2023   PLT 306 08/08/2023   NA 139 08/08/2023   K 4.3 08/08/2023   CL 105 08/08/2023   CO2 26 08/08/2023   GLUCOSE 105 (H) 08/08/2023   BUN 16 08/08/2023   CREATININE 0.76 08/08/2023   BILITOT 0.6 05/19/2023   ALKPHOS 65 01/19/2017   AST 19 05/19/2023   ALT 23 05/19/2023   PROT 7.1 05/19/2023   ALBUMIN 4.5 01/19/2017   CALCIUM 8.9 08/08/2023   GFRAA >60 01/28/2017   QFTBGOLDPLUS NEGATIVE 02/12/2022    Speciality Comments: No specialty comments available.  Procedures:  Hand/UE Inj: R carpal tunnel for carpal tunnel syndrome on 10/11/2023 11:45 AM Indications: joint swelling, pain and therapeutic Details: 27 G needle, ultrasound-guided ulnar approach Medications: 1 mL lidocaine  1 %; 40 mg triamcinolone  acetonide 40 MG/ML Outcome: tolerated well, no immediate complications Procedure, treatment alternatives, risks and benefits explained, specific risks discussed. Consent was given by the patient. Immediately prior to procedure a time out was called to verify the correct patient, procedure, equipment, support staff and site/side marked as required. Patient was prepped and draped in the usual sterile fashion.     Allergies: Tramadol  and Meperidine   Assessment / Plan:     Visit Diagnoses: Seropositive rheumatoid arthritis (HCC) - Plan: Sedimentation rate Rheumatoid arthritis with bilateral wrist involvement and chronic rheumatoid arthritis with significant bilateral  wrist involvement, synovial thickening, and inflammation.  No other peripheral joint synovitis appreciated. - Rechecking sedimentation rate for disease activity monitoring - Continue methotrexate  50 mg p.o. weekly folic acid  1 mg daily  Carpal tunnel syndrome, right upper limb - Plan: US  Guided Needle Placement Right carpal tunnel syndrome appears secondary to the flexor tenosynovitis swelling.  Not sure if the current swelling is due to RA disease activity increase or possible use related as he describes somewhat more intense use than typical for him with golfing. - Administered cortisone injection to right wrist under ultrasound guidance.  High risk medication use - methotrexate  15 mg p.o. weekly folic acid  1 mg daily.  Continue diclofenac  75 mg half a tablet 1-2 times daily as needed - Plan: CBC with Differential/Platelet, Comprehensive metabolic panel with GFR Had temporarily paused methotrexate  perioperatively for his hip surgery but back on treatment since last month.  He has not reported any specific medication symptoms.  No serious interval infections. - Checking CBC and CMP for medication monitoring on continued methotrexate   Extensor tenosynovitis of left wrist Doing great now with full range of motion in finger extension.  Last follow-up with Dr. Gladis in July with no additional therapy needed.    Orders: Orders Placed This Encounter  Procedures   Hand/UE Inj   US  Guided Needle Placement   Sedimentation rate   CBC with Differential/Platelet   Comprehensive metabolic panel with GFR   No orders of the defined types were placed in this encounter.    Follow-Up Instructions: Return in about 3 months (around 01/11/2024) for RA on MTX/inj f/u 3mos.   Lonni LELON Ester, MD  Note - This record has been created using [employer].  Chart creation errors have been sought, but may not always  have been located. Such creation errors do not reflect on  the standard of  medical care.

## 2023-10-04 ENCOUNTER — Other Ambulatory Visit (INDEPENDENT_AMBULATORY_CARE_PROVIDER_SITE_OTHER)

## 2023-10-04 ENCOUNTER — Ambulatory Visit (INDEPENDENT_AMBULATORY_CARE_PROVIDER_SITE_OTHER): Admitting: Orthopaedic Surgery

## 2023-10-04 ENCOUNTER — Encounter: Payer: Self-pay | Admitting: Orthopaedic Surgery

## 2023-10-04 DIAGNOSIS — Z96641 Presence of right artificial hip joint: Secondary | ICD-10-CM | POA: Diagnosis not present

## 2023-10-04 DIAGNOSIS — Z96651 Presence of right artificial knee joint: Secondary | ICD-10-CM

## 2023-10-04 MED ORDER — METHOCARBAMOL 750 MG PO TABS: 750.0000 mg | ORAL_TABLET | Freq: Three times a day (TID) | ORAL | 1 refills | Status: DC | PRN

## 2023-10-04 NOTE — Progress Notes (Signed)
 Post-Op Visit Note   Patient: Taylor Heath           Date of Birth: [birth year]           MRN: [id number] Visit Date: 10/04/2023 PCP: Bertell Satterfield, MD   Assessment & Plan:  Chief Complaint:  Chief Complaint  Patient presents with   Right Hip - Follow-up    Right total hip arthroplasty 08/15/2023   Visit Diagnoses:  1. Status post total replacement of right hip   2. Status post total right knee replacement     Plan: History of Present Illness Taylor Heath is a 63 year old male who presents for follow-up after right hip replacement surgery.  He underwent right hip replacement surgery approximately fifty days ago. He experiences discomfort in the gluteal region, particularly after activities like golfing. There are strange sensations and stinging around the surgical scar, especially after physical activities such as pulling weeds. Occasional cramps occur, and he requests a refill on his muscle relaxer.  He experiences pain in his left hip when getting up incorrectly, which can be severe but resolves quickly. Walking or playing golf causes discomfort in his left lumbar region, though he feels more balanced post-surgery.  He notes weakness in his legs over the past few years, affecting his golf performance, and is considering using a recumbent bike to improve leg strength.  He reports a clanking noise in his knee, attributed to the metal and plastic components of his knee replacement. There is some pain and instability, particularly at night with restless legs, though it does not cause significant pain during the day.  Physical Exam MUSCULOSKELETAL: Hip incision healed.  Results RADIOLOGY Hip X-ray: Prosthetic implants are well-positioned.  Assessment and Plan Status post right total hip arthroplasty with post-operative right hip pain Post-operative right hip pain likely due to increased activity and muscle use. X-rays show good implant positioning and healing. -  Refill muscle relaxer prescription.  Status post left total hip arthroplasty with post-operative left hip pain Post-operative left hip pain possibly due to compensatory mechanisms from increased use during right hip recovery.  Status post right total knee arthroplasty with right knee pain and instability Right knee pain and instability likely from metal hitting plastic components, possible plastic insert wear causing increased joint play. Discussed potential need for thicker insert replacement if instability worsens. - Order right knee x-ray to assess plastic insert condition. - Evaluate x-ray results for potential insert replacement.  Follow-Up Instructions: Return in about 6 weeks (around 11/15/2023) for with lindsey.   Orders:  Orders Placed This Encounter  Procedures   XR Pelvis 1-2 Views   XR Knee 1-2 Views Right   Meds ordered this encounter  Medications   methocarbamol  (ROBAXIN ) 750 MG tablet    Sig: Take 1 tablet (750 mg total) by mouth 3 (three) times daily as needed.    Dispense:  20 tablet    Refill:  1    Imaging: XR Knee 1-2 Views Right Result Date: 10/04/2023 X-rays of the right knee demonstrate a stable right total knee replacement in good alignment   XR Pelvis 1-2 Views Result Date: 10/04/2023 Stable total hip replacement without complications   PMFS History: Patient Active Problem List   Diagnosis Date Noted   Primary osteoarthritis of right hip 08/15/2023   Status post total replacement of right hip 08/15/2023   Umbilical hernia without obstruction and without gangrene 04/07/2022   Rectus diastasis 04/07/2022   Seropositive rheumatoid arthritis (HCC) 02/12/2022  High risk medication use 02/12/2022   Abscess of dorsum of left hand 01/26/2022   Extensor tenosynovitis of left wrist 12/01/2021   Primary osteoarthritis of right wrist 04/26/2017   Radiculopathy of cervical spine 04/26/2017   Total knee replacement status 01/27/2017   Primary osteoarthritis of  right shoulder 12/30/2016   Primary osteoarthritis of right knee 12/30/2016   Insomnia 03/10/2012   BPH (benign prostatic hyperplasia) 12/09/2011   GERD (gastroesophageal reflux disease) 12/09/2011   Past Medical History:  Diagnosis Date   Anxiety    Arthritis    GERD (gastroesophageal reflux disease)    History of kidney stones    Pneumonia    Rheumatoid arthritis (HCC)     Family History  Problem Relation Age of Onset   Rheum arthritis Father     Past Surgical History:  Procedure Laterality Date   COLONOSCOPY  2015   FINGER SURGERY Left    index   HAND SURGERY Left 04/04/2023   HERNIA REPAIR  04/30/2022   I & D EXTREMITY Left 01/27/2022   Procedure: IRRIGATION AND DEBRIDEMENT LEFT HAND;  Surgeon: Jerri Kay HERO, MD;  Location: [facility] OR;  Service: Orthopedics;  Laterality: Left;   I & D EXTREMITY Left 01/29/2022   Procedure: REPEAT IRRIGATION AND DEBRIDEMENT OF HAND;  Surgeon: Jerri Kay HERO, MD;  Location: [facility];  Service: Orthopedics;  Laterality: Left;   JOINT REPLACEMENT     left hip replaced   KNEE ARTHROSCOPY W/ ACL RECONSTRUCTION Right 2009   LEG SURGERY Left    left calf   MINOR APPLICATION OF WOUND VAC  01/29/2022   Procedure: APPLICATION OF WOUND VAC;  Surgeon: Jerri Kay HERO, MD;  Location: [facility];  Service: Orthopedics;;   ORIF FINGER / THUMB FRACTURE     TOTAL HIP ARTHROPLASTY Right 08/15/2023   Procedure: ARTHROPLASTY, HIP, TOTAL, ANTERIOR APPROACH;  Surgeon: Jerri Kay HERO, MD;  Location: [facility];  Service: Orthopedics;  Laterality: Right;  3-C   TOTAL KNEE ARTHROPLASTY Right 01/27/2017   Procedure: RIGHT TOTAL KNEE ARTHROPLASTY;  Surgeon: Jerri Kay HERO, MD;  Location: [facility];  Service: Orthopedics;  Laterality: Right;   Social History   Occupational History   Not on file  Tobacco Use   Smoking status: Former    Current packs/day: 0.00    Types: Cigarettes    Quit date: 04/21/2010    Years since quitting: 13.4    Passive exposure: Never   Smokeless tobacco:  Never  Vaping Use   Vaping status: Never Used  Substance and Sexual Activity   Alcohol use: No   Drug use: No   Sexual activity: Not on file

## 2023-10-11 ENCOUNTER — Ambulatory Visit: Attending: Internal Medicine | Admitting: Internal Medicine

## 2023-10-11 ENCOUNTER — Encounter: Payer: Self-pay | Admitting: Internal Medicine

## 2023-10-11 ENCOUNTER — Ambulatory Visit

## 2023-10-11 VITALS — BP 150/80 | HR 71 | Resp 16 | Ht 69.0 in | Wt 166.4 lb

## 2023-10-11 DIAGNOSIS — M65932 Unspecified synovitis and tenosynovitis, left forearm: Secondary | ICD-10-CM

## 2023-10-11 DIAGNOSIS — G5601 Carpal tunnel syndrome, right upper limb: Secondary | ICD-10-CM | POA: Insufficient documentation

## 2023-10-11 DIAGNOSIS — M059 Rheumatoid arthritis with rheumatoid factor, unspecified: Secondary | ICD-10-CM

## 2023-10-11 DIAGNOSIS — Z79899 Other long term (current) drug therapy: Secondary | ICD-10-CM | POA: Diagnosis not present

## 2023-10-11 MED ORDER — LIDOCAINE HCL 1 % IJ SOLN
1.0000 mL | INTRAMUSCULAR | Status: AC | PRN
  Administered 2023-10-11 (×2): 1 mL

## 2023-10-11 MED ORDER — TRIAMCINOLONE ACETONIDE 40 MG/ML IJ SUSP
40.0000 mg | INTRAMUSCULAR | Status: AC | PRN
  Administered 2023-10-11 (×2): 40 mg

## 2023-10-12 ENCOUNTER — Ambulatory Visit: Payer: Self-pay | Admitting: Internal Medicine

## 2023-10-12 LAB — COMPREHENSIVE METABOLIC PANEL WITH GFR
AG Ratio: 2.1 (calc) (ref 1.0–2.5)
ALT: 22 U/L (ref 9–46)
AST: 20 U/L (ref 10–35)
Albumin: 4.7 g/dL (ref 3.6–5.1)
Alkaline phosphatase (APISO): 75 U/L (ref 35–144)
BUN: 18 mg/dL (ref 7–25)
CO2: 28 mmol/L (ref 20–32)
Calcium: 9.7 mg/dL (ref 8.6–10.3)
Chloride: 104 mmol/L (ref 98–110)
Creat: 0.8 mg/dL (ref 0.70–1.35)
Globulin: 2.2 g/dL (ref 1.9–3.7)
Glucose, Bld: 94 mg/dL (ref 65–99)
Potassium: 4.5 mmol/L (ref 3.5–5.3)
Sodium: 140 mmol/L (ref 135–146)
Total Bilirubin: 0.4 mg/dL (ref 0.2–1.2)
Total Protein: 6.9 g/dL (ref 6.1–8.1)
eGFR: 99 mL/min/1.73m2 (ref >=60)

## 2023-10-12 LAB — CBC WITH DIFFERENTIAL/PLATELET
Absolute Lymphocytes: 1442 {cells}/uL (ref 850–3900)
Absolute Monocytes: 329 {cells}/uL (ref 200–950)
Basophils Absolute: 28 {cells}/uL (ref 0–200)
Basophils Relative: 0.4 %
Eosinophils Absolute: 63 {cells}/uL (ref 15–500)
Eosinophils Relative: 0.9 %
HCT: 44 % (ref 38.5–50.0)
Hemoglobin: 14.4 g/dL (ref 13.2–17.1)
MCH: 31 pg (ref 27.0–33.0)
MCHC: 32.7 g/dL (ref 32.0–36.0)
MCV: 94.8 fL (ref 80.0–100.0)
MPV: 9.1 fL (ref 7.5–12.5)
Monocytes Relative: 4.7 %
Neutro Abs: 5138 {cells}/uL (ref 1500–7800)
Neutrophils Relative %: 73.4 %
Platelets: 321 Thousand/uL (ref 140–400)
RBC: 4.64 Million/uL (ref 4.20–5.80)
RDW: 13.5 % (ref 11.0–15.0)
Total Lymphocyte: 20.6 %
WBC: 7 Thousand/uL (ref 3.8–10.8)

## 2023-10-12 LAB — SEDIMENTATION RATE: Sed Rate: 6 mm/h (ref 0–20)

## 2023-10-12 NOTE — Progress Notes (Signed)
 Sedimentation rate of 6 remains normal. So this inflammation in the wrists seems to be a localized process. His blood count and kidney and liver function test are normal so no problem for continuing current methotrexate .

## 2023-10-20 ENCOUNTER — Other Ambulatory Visit: Payer: Self-pay

## 2023-10-20 ENCOUNTER — Other Ambulatory Visit: Payer: Self-pay | Admitting: Orthopaedic Surgery

## 2023-10-20 ENCOUNTER — Telehealth: Payer: Self-pay | Admitting: Orthopaedic Surgery

## 2023-10-20 NOTE — Telephone Encounter (Signed)
 Approve on refill

## 2023-10-20 NOTE — Telephone Encounter (Signed)
 Patient called and said he needs you to call him and can he get a refill on Muscle relaxers. [phone]

## 2023-10-27 ENCOUNTER — Telehealth: Payer: Self-pay | Admitting: Orthopaedic Surgery

## 2023-10-27 NOTE — Telephone Encounter (Signed)
 Patient's wife called. Says patient is having a lot of pain in his hip/back. Should he come in? What should he do? Would like a call.

## 2023-10-31 DIAGNOSIS — M419 Scoliosis, unspecified: Secondary | ICD-10-CM

## 2023-10-31 HISTORY — DX: Scoliosis, unspecified: M41.9

## 2023-11-04 ENCOUNTER — Ambulatory Visit (INDEPENDENT_AMBULATORY_CARE_PROVIDER_SITE_OTHER): Admitting: Physician Assistant

## 2023-11-04 ENCOUNTER — Other Ambulatory Visit (INDEPENDENT_AMBULATORY_CARE_PROVIDER_SITE_OTHER)

## 2023-11-04 DIAGNOSIS — M545 Low back pain, unspecified: Secondary | ICD-10-CM | POA: Diagnosis not present

## 2023-11-04 DIAGNOSIS — Z96641 Presence of right artificial hip joint: Secondary | ICD-10-CM | POA: Diagnosis not present

## 2023-11-04 MED ORDER — KETOROLAC TROMETHAMINE 30 MG/ML IJ SOLN: 30.0000 mg | Freq: Once | INTRAMUSCULAR | Status: AC

## 2023-11-04 MED ORDER — PREDNISONE 10 MG (21) PO TBPK: ORAL_TABLET | ORAL | 0 refills | Status: AC

## 2023-11-04 MED ORDER — METHOCARBAMOL 750 MG PO TABS: 750.0000 mg | ORAL_TABLET | Freq: Three times a day (TID) | ORAL | 1 refills | Status: DC | PRN

## 2023-11-04 MED ORDER — HYDROCODONE-ACETAMINOPHEN 5-325 MG PO TABS: 1.0000 | ORAL_TABLET | Freq: Two times a day (BID) | ORAL | 0 refills | Status: AC | PRN

## 2023-11-04 NOTE — Progress Notes (Signed)
 Office Visit Note   Patient: Taylor Heath           Date of Birth: 03-25-1960           MRN: [id number] Visit Date: 11/04/2023              Requested by: Bertell Satterfield, MD [address] [place],  [place] [address] PCP: Bertell Satterfield, MD   Assessment & Plan: Visit Diagnoses:  1. Low back pain, unspecified back pain laterality, unspecified chronicity, unspecified whether sciatica present   2. Status post total replacement of right hip     Plan: Impression is 3 months status post right total hip replacement and acute on chronic right low back pain with right lower extremity radiculopathy.  In regards to the hip, he is doing well.  Follow-up in 3 months for recheck.  In regards to the back, he is quite uncomfortable today so we will provide him with an IM Toradol  injection.  Have also sent in a steroid taper, muscle relaxer and a small prescription of Norco.  We have provided him with a spine conditioning program as well as sent in referral for outpatient physical therapy.  If his symptoms do not improve over the next month to 2 months, he will follow-up with Megan Williams for further evaluation.  Follow-Up Instructions: Return in about 3 months (around 02/03/2024) for for right hip.   Orders:  Orders Placed This Encounter  Procedures   XR Lumbar Spine 2-3 Views   No orders of the defined types were placed in this encounter.     Procedures: No procedures performed   Clinical Data: No additional findings.   Subjective: Chief Complaint  Patient presents with   Lower Back - Pain    HPI patient is a pleasant 63 year old gentleman who comes in today 3 months status post right total hip replacement, date of surgery 08/15/2023.  Doing okay in regards to the right hip.  His main issue has been his right low back and right lower extremity sciatica.  This has been ongoing for about 2 months and has progressively worsened.  Symptoms occurred after playing golf.  The  pain he has starts in the right lower back radiates into his butt and all the way down the back of his leg and into his foot where he has associated paresthesias.  Symptoms have become constant.  He has been taking NSAIDs without significant relief.  He denies any weakness bowel or bladder change or saddle paresthesias.  Review of Systems as detailed in HPI.  All others reviewed and are negative.   Objective: Vital Signs: There were no vitals taken for this visit.  Physical Exam well-developed well-nourished gentleman in no acute distress.  Alert and oriented x 3.  Ortho Exam lumbar spine exam: Increased pain with lumbar extension.  No spinous tenderness.  He does have right lower lumbar paraspinous musculature tenderness.  Positive straight leg raise on the right.  No focal weakness.  He is neurovascular intact distally.  Specialty Comments:  No specialty comments available.  Imaging: XR Lumbar Spine 2-3 Views Result Date: 11/04/2023 Advanced multilevel degenerative changes.  Underlying advanced scoliosis    PMFS History: Patient Active Problem List   Diagnosis Date Noted   Carpal tunnel syndrome, right upper limb 10/11/2023   Primary osteoarthritis of right hip 08/15/2023   Status post total replacement of right hip 08/15/2023   Umbilical hernia without obstruction and without gangrene 04/07/2022   Rectus diastasis 04/07/2022  Seropositive rheumatoid arthritis (HCC) 02/12/2022   High risk medication use 02/12/2022   Abscess of dorsum of left hand 01/26/2022   Extensor tenosynovitis of left wrist 12/01/2021   Primary osteoarthritis of right wrist 04/26/2017   Radiculopathy of cervical spine 04/26/2017   Total knee replacement status 01/27/2017   Primary osteoarthritis of right shoulder 12/30/2016   Primary osteoarthritis of right knee 12/30/2016   Insomnia 03/10/2012   BPH (benign prostatic hyperplasia) 12/09/2011   GERD (gastroesophageal reflux disease) 12/09/2011   Past  Medical History:  Diagnosis Date   Anxiety    Arthritis    GERD (gastroesophageal reflux disease)    History of kidney stones    Pneumonia    Rheumatoid arthritis (HCC)     Family History  Problem Relation Age of Onset   Rheum arthritis Father     Past Surgical History:  Procedure Laterality Date   COLONOSCOPY  2015   FINGER SURGERY Left    index   HAND SURGERY Left 04/04/2023   HERNIA REPAIR  04/30/2022   I & D EXTREMITY Left 01/27/2022   Procedure: IRRIGATION AND DEBRIDEMENT LEFT HAND;  Surgeon: Jerri Kay HERO, MD;  Location: [facility];  Service: Orthopedics;  Laterality: Left;   I & D EXTREMITY Left 01/29/2022   Procedure: REPEAT IRRIGATION AND DEBRIDEMENT OF HAND;  Surgeon: Jerri Kay HERO, MD;  Location: [facility];  Service: Orthopedics;  Laterality: Left;   JOINT REPLACEMENT     left hip replaced   KNEE ARTHROSCOPY W/ ACL RECONSTRUCTION Right 2009   LEG SURGERY Left    left calf   MINOR APPLICATION OF WOUND VAC  01/29/2022   Procedure: APPLICATION OF WOUND VAC;  Surgeon: Jerri Kay HERO, MD;  Location: [facility];  Service: Orthopedics;;   ORIF FINGER / THUMB FRACTURE     TOTAL HIP ARTHROPLASTY Right 08/15/2023   Procedure: ARTHROPLASTY, HIP, TOTAL, ANTERIOR APPROACH;  Surgeon: Jerri Kay HERO, MD;  Location: [facility];  Service: Orthopedics;  Laterality: Right;  3-C   TOTAL KNEE ARTHROPLASTY Right 01/27/2017   Procedure: RIGHT TOTAL KNEE ARTHROPLASTY;  Surgeon: Jerri Kay HERO, MD;  Location: [facility];  Service: Orthopedics;  Laterality: Right;   Social History   Occupational History   Not on file  Tobacco Use   Smoking status: Former    Current packs/day: 0.00    Types: Cigarettes    Quit date: 04/21/2010    Years since quitting: 13.5    Passive exposure: Past   Smokeless tobacco: Former    Types: Engineer, drilling   Vaping status: Former  Substance and Sexual Activity   Alcohol use: No   Drug use: No   Sexual activity: Not on file

## 2023-11-04 NOTE — Addendum Note (Signed)
 Addended by: Rhegan Trunnell on: 11/04/2023 10:55 AM   Modules accepted: Orders

## 2023-11-08 ENCOUNTER — Ambulatory Visit: Admitting: Rehabilitative and Restorative Service Providers"

## 2023-11-11 ENCOUNTER — Other Ambulatory Visit: Payer: Commercial Managed Care - HMO

## 2023-11-13 ENCOUNTER — Other Ambulatory Visit: Payer: Self-pay | Admitting: Internal Medicine

## 2023-11-13 DIAGNOSIS — M059 Rheumatoid arthritis with rheumatoid factor, unspecified: Secondary | ICD-10-CM

## 2023-11-14 ENCOUNTER — Other Ambulatory Visit

## 2023-11-14 DIAGNOSIS — R972 Elevated prostate specific antigen [PSA]: Secondary | ICD-10-CM

## 2023-11-14 NOTE — Telephone Encounter (Signed)
 Last Fill: 08/22/2023  Labs: 10/11/2023 Sedimentation rate of 6 remains normal. So this inflammation in the wrists seems to be a localized process. His blood count and kidney and liver function test are normal so no problem for continuing current methotrexate .   Next Visit: 01/11/2024  Last Visit: 10/11/2023  DX: Seropositive rheumatoid arthritis   Current Dose per office note 10/11/2023: diclofenac  75 mg half a tablet 1-2 times daily as needed   Okay to refill Diclofenac ?

## 2023-11-14 NOTE — Therapy (Incomplete)
 OUTPATIENT PHYSICAL THERAPY EVALUATION   Patient Name: Taylor Heath MRN: [id number] DOB:1960-08-22, 63 y.o., male Today's Date: 11/14/2023  END OF SESSION:   Past Medical History:  Diagnosis Date   Anxiety    Arthritis    GERD (gastroesophageal reflux disease)    History of kidney stones    Pneumonia    Rheumatoid arthritis (HCC)    Past Surgical History:  Procedure Laterality Date   COLONOSCOPY  2015   FINGER SURGERY Left    index   HAND SURGERY Left 04/04/2023   HERNIA REPAIR  04/30/2022   I & D EXTREMITY Left 01/27/2022   Procedure: IRRIGATION AND DEBRIDEMENT LEFT HAND;  Surgeon: Jerri Kay HERO, MD;  [facility];  Service: Orthopedics;  Laterality: Left;   I & D EXTREMITY Left 01/29/2022   Procedure: REPEAT IRRIGATION AND DEBRIDEMENT OF HAND;  Surgeon: Jerri Kay HERO, MD;  [facility];  Service: Orthopedics;  Laterality: Left;   JOINT REPLACEMENT     left hip replaced   KNEE ARTHROSCOPY W/ ACL RECONSTRUCTION Right 2009   LEG SURGERY Left    left calf   MINOR APPLICATION OF WOUND VAC  01/29/2022   Procedure: APPLICATION OF WOUND VAC;  Surgeon: Jerri Kay HERO, MD;  [facility];  Service: Orthopedics;;   ORIF FINGER / THUMB FRACTURE     TOTAL HIP ARTHROPLASTY Right 08/15/2023   Procedure: ARTHROPLASTY, HIP, TOTAL, ANTERIOR APPROACH;  Surgeon: Jerri Kay HERO, MD;  Location: [facility];  Service: Orthopedics;  Laterality: Right;  3-C   TOTAL KNEE ARTHROPLASTY Right 01/27/2017   Procedure: RIGHT TOTAL KNEE ARTHROPLASTY;  Surgeon: Jerri Kay HERO, MD;  Location: [facility];  Service: Orthopedics;  Laterality: Right;   Patient Active Problem List   Diagnosis Date Noted   Carpal tunnel syndrome, right upper limb 10/11/2023   Primary osteoarthritis of right hip 08/15/2023   Status post total replacement of right hip 08/15/2023   Umbilical hernia without obstruction and without gangrene 04/07/2022   Rectus diastasis 04/07/2022   Seropositive rheumatoid arthritis (HCC)  02/12/2022   High risk medication use 02/12/2022   Abscess of dorsum of left hand 01/26/2022   Extensor tenosynovitis of left wrist 12/01/2021   Primary osteoarthritis of right wrist 04/26/2017   Radiculopathy of cervical spine 04/26/2017   Total knee replacement status 01/27/2017   Primary osteoarthritis of right shoulder 12/30/2016   Primary osteoarthritis of right knee 12/30/2016   Insomnia 03/10/2012   BPH (benign prostatic hyperplasia) 12/09/2011   GERD (gastroesophageal reflux disease) 12/09/2011    PCP: Bertell Satterfield , MD  REFERRING PROVIDER: Jule Ronal CROME, PA-C  REFERRING DIAG: M54.50 (ICD-10-CM) - Low back pain, unspecified back pain laterality, unspecified chronicity, unspecified whether sciatica present  Rationale for Evaluation and Treatment: Rehabilitation  THERAPY DIAG:  No diagnosis found.  ONSET DATE: ***  SUBJECTIVE:  SUBJECTIVE STATEMENT: ***  PERTINENT HISTORY:  Chronic back pain complaints, arthritis, anxiety, GERD, RA  PAIN:  NPRS scale: ***/10 Pain location: *** Pain description: *** Aggravating factors: *** Relieving factors: ***  PRECAUTIONS: None  WEIGHT BEARING RESTRICTIONS: No  FALLS:  Has patient fallen in last 6 months? No  LIVING ENVIRONMENT: Lives with: {OPRC lives with:25569::lives with their family} Lives in: {Lives in:25570} Stairs: {opstairs:27293} Has following equipment at home: {Assistive devices:23999}  OCCUPATION: ***  PLOF: Independent  PATIENT GOALS: ***  Next MD Visit:    OBJECTIVE:   DIAGNOSTIC FINDINGS:  ***  PATIENT SURVEYS:  Patient-Specific Activity Scoring Scheme  0 represents "unable to perform." 10 represents "able to perform at prior level. 0 1 2 3 4 5 6 7 8 9  10 (Date and Score)   Activity Eval   11/15/2023    1. ***      2. ***      3. ***    4.    5.    Score ***    Total score = sum of the activity scores/number of activities Minimum detectable change (90%CI) for average score = 2 points Minimum detectable change (90%CI) for single activity score = 3 points  SCREENING FOR RED FLAGS: 11/15/2023 Bowel or bladder incontinence: {Bzd/Wn:695039105} Cauda equina syndrome: {Bzd/Wn:695039105}  COGNITION: 11/15/2023 Overall cognitive status: WFL normal      SENSATION: 11/15/2023 {sensation:27233}  MUSCLE LENGTH: 11/15/2023 Hamstrings: Right *** deg; Left *** deg Debby test: Right *** deg; Left *** deg  POSTURE:  11/15/2023 {posture:25561}  PALPATION: 11/15/2023 ***  LUMBAR ROM:  11/15/2023 Directional Preference Assessment: Centralization: Peripheralization:   AROM Eval 11/15/2023  Flexion   Extension   Right lateral flexion   Left lateral flexion   Right rotation   Left rotation    (Blank rows = not tested)  LOWER EXTREMITY ROM:     {AROM/PROM:27142}  Right Eval 11/15/2023 Left Eval 11/15/2023  Hip flexion    Hip extension    Hip abduction    Hip adduction    Hip internal rotation    Hip external rotation    Knee flexion    Knee extension    Ankle dorsiflexion    Ankle plantarflexion    Ankle inversion    Ankle eversion     (Blank rows = not tested)  LOWER EXTREMITY MMT:    MMT Right Eval 11/15/2023 Left Eval 11/15/2023  Hip flexion    Hip extension    Hip abduction    Hip adduction    Hip internal rotation    Hip external rotation    Knee flexion    Knee extension    Ankle dorsiflexion    Ankle plantarflexion    Ankle inversion    Ankle eversion     (Blank rows = not tested)  LUMBAR SPECIAL TESTS:  11/15/2023 {lumbar special test:25242}  FUNCTIONAL TESTS:  11/15/2023 {Functional tests:24029}  GAIT: 11/15/2023  TODAY'S TREATMENT:                                                                                                         DATE: 11/15/2023  Therex:    HEP instruction/performance c cues for techniques, handout provided.  Trial set performed of each for comprehension and symptom assessment.  See below for exercise list  PATIENT EDUCATION:  11/15/2023 Education details: HEP, POC Person educated: Patient Education method: Programmer, multimedia, Demonstration, Verbal cues, and Handouts Education comprehension: verbalized understanding, returned demonstration, and verbal cues required  HOME EXERCISE PROGRAM: ***  ASSESSMENT:  CLINICAL IMPRESSION: Patient is a 63 y.o. who comes to clinic with complaints of ***pain with mobility, strength and movement coordination deficits that impair their ability to perform usual daily and recreational functional activities without increase difficulty/symptoms at this time.  Patient to benefit from skilled PT services to address impairments and limitations to improve to previous level of function without restriction secondary to condition.   OBJECTIVE IMPAIRMENTS: {opptimpairments:25111}.   ACTIVITY LIMITATIONS: {activitylimitations:27494}  PARTICIPATION LIMITATIONS: {participationrestrictions:25113}  PERSONAL FACTORS: {Personal factors:25162} are also affecting patient's functional outcome.   REHAB POTENTIAL: {rehabpotential:25112}  CLINICAL DECISION MAKING: {clinical decision making:25114}  EVALUATION COMPLEXITY: {Evaluation complexity:25115}   GOALS: Goals reviewed with patient? Yes  SHORT TERM GOALS: (target date for Short term goals are 3 weeks 12/06/2023)  1. Patient will demonstrate independent use of home exercise program to maintain progress from in clinic treatments.  Goal status: New  LONG TERM GOALS: (target dates for all long term goals are 10  weeks  01/24/2024 )   1. Patient will demonstrate/report pain at worst less than or equal to 2/10 to facilitate minimal limitation in daily activity secondary to pain symptoms.  Goal status: New   2. Patient will demonstrate independent use of home exercise program to facilitate ability to maintain/progress functional gains from skilled physical therapy services.  Goal status: New   3. Patient will demonstrate Patient specific functional scale avg > or = *** to indicate reduced disability due to condition.   Goal status: New   4. Patient will demonstrate lumbar extension 100 % WFL s symptoms to facilitate upright standing, walking posture at PLOF s limitation.  Goal status: New   5.  ***  Goal status: New   6.  *** Goal status: New   7.  *** Goal Status: New  PLAN:  PT FREQUENCY: 1-2x/week  PT DURATION: 10 weeks  PLANNED INTERVENTIONS: Can include 02853- PT Re-evaluation, 97110-Therapeutic exercises, 97530- Therapeutic activity, [id number]- Neuromuscular re-education, 97535- Self Care, 97140- Manual therapy, [phone]- Gait training, [phone]- Orthotic Fit/training, [phone]- Canalith repositioning, [id number]- Aquatic Therapy, [phone]- Electrical stimulation (unattended), [id number] Physical performance testing, 97016- Vasopneumatic device, [id number]- Ultrasound, [id number]- Traction (mechanical), [id number]- Ionotophoresis 4mg /ml Dexamethasone ,  79439 - Needle insertion w/o injection 1 or 2 muscles, 20561 - Needle insertion w/o injection 3 or more muscles.    Patient/Family education, Balance training, Stair training, Taping, Dry Needling, Joint mobilization, Joint manipulation, Spinal manipulation, Spinal mobilization, Scar mobilization, Vestibular training, Visual/preceptual remediation/compensation, DME instructions, Cryotherapy, and Moist heat.  All performed as medically necessary.  All included unless contraindicated  PLAN FOR NEXT SESSION: Review HEP knowledge/results.    Ozell Silvan, PT, DPT, OCS,  ATC 11/14/23  2:16 PM

## 2023-11-15 ENCOUNTER — Ambulatory Visit (INDEPENDENT_AMBULATORY_CARE_PROVIDER_SITE_OTHER): Admitting: Physician Assistant

## 2023-11-15 ENCOUNTER — Encounter: Payer: Self-pay | Admitting: Physical Medicine and Rehabilitation

## 2023-11-15 ENCOUNTER — Ambulatory Visit (INDEPENDENT_AMBULATORY_CARE_PROVIDER_SITE_OTHER): Admitting: Physical Medicine and Rehabilitation

## 2023-11-15 ENCOUNTER — Telehealth: Payer: Self-pay | Admitting: Physical Medicine and Rehabilitation

## 2023-11-15 ENCOUNTER — Ambulatory Visit: Admitting: Rehabilitative and Restorative Service Providers"

## 2023-11-15 ENCOUNTER — Ambulatory Visit

## 2023-11-15 ENCOUNTER — Other Ambulatory Visit: Payer: Self-pay

## 2023-11-15 DIAGNOSIS — M5459 Other low back pain: Secondary | ICD-10-CM

## 2023-11-15 DIAGNOSIS — M4126 Other idiopathic scoliosis, lumbar region: Secondary | ICD-10-CM

## 2023-11-15 DIAGNOSIS — M5441 Lumbago with sciatica, right side: Secondary | ICD-10-CM

## 2023-11-15 DIAGNOSIS — M5416 Radiculopathy, lumbar region: Secondary | ICD-10-CM | POA: Diagnosis not present

## 2023-11-15 DIAGNOSIS — G8929 Other chronic pain: Secondary | ICD-10-CM

## 2023-11-15 DIAGNOSIS — Z96641 Presence of right artificial hip joint: Secondary | ICD-10-CM

## 2023-11-15 LAB — PSA: Prostate Specific Ag, Serum: 2.8 ng/mL (ref 0.0–4.0)

## 2023-11-15 MED ORDER — METHOCARBAMOL 750 MG PO TABS: 750.0000 mg | ORAL_TABLET | Freq: Two times a day (BID) | ORAL | 0 refills | Status: DC | PRN

## 2023-11-15 MED ORDER — ACETAMINOPHEN-CODEINE 300-30 MG PO TABS: 1.0000 | ORAL_TABLET | Freq: Two times a day (BID) | ORAL | 0 refills | Status: AC | PRN

## 2023-11-15 NOTE — Progress Notes (Signed)
 Taylor Heath - 63 y.o. male MRN [id number]  Date of birth: June 23, 1960  Office Visit Note: Visit Date: 11/15/2023 PCP: Bertell Satterfield, MD Referred by: Taylor Ronal CROME, PA-C  Subjective: Chief Complaint  Patient presents with   Lower Back - Pain   HPI: Taylor Heath is a 63 y.o. male who comes in today per the request of Morna Taylor, [place] for evaluation of chronic, worsening and severe right sided lower back pain radiating to buttock and down lateral leg to ankle. Pain ongoing for several years. His pain worsens with movement and activity. He reports severe pain when swinging golf club. He describes pain as sharp and stabbing sensation, currently rates as 5 out of 10. Some relief of pain with oral Prednisone , Robaxin  and Norco. He is scheduled to start dedicated physical therapy for his lower back today. He is managed from orthopedic standpoint by Dr. Ozell Cummins, underwent right total hip arthroplasty on 08/15/2023. Recent lumbar radiographs show advanced scoliotic curvature to the left, also multi level advanced degenerative changes. Patient denies focal weakness, numbness and tingling. No recent trauma or falls.      Review of Systems  Musculoskeletal:  Positive for back pain.  Neurological:  Negative for tingling, sensory change, focal weakness and weakness.  All other systems reviewed and are negative.  Otherwise per HPI.  Assessment & Plan: Visit Diagnoses:    ICD-10-CM   1. Chronic right-sided low back pain with right-sided sciatica  M54.41 MR LUMBAR SPINE WO CONTRAST   G89.29     2. Lumbar radiculopathy  M54.16 MR LUMBAR SPINE WO CONTRAST       Plan: Findings:  Chronic, worsening and severe right sided lower back pain radiating to buttock and down lateral leg to ankle. Patient continues to have severe pain despite good conservative therapies such as home exercise regimen, rest and use of medications. Patients clinical presentation and exam are consistent with lumbar  radiculopathy, more of L5 nerve pattern. Recent radiographs of lumbar spine do show significant scoliotic curvature and multi level degenerative changes. We discussed treatment plan in detail today. Next step is to place order for lumbar MRI imaging. Depending on results of MRI imaging we discussed possibility of performing lumbar epidural steroid injection. I encouraged him to proceed with physical therapy as tolerated. He can continue with current medication regimen. We will see him back for lumbar MRI review. No red flag symptoms noted upon exam today.     Meds & Orders: No orders of the defined types were placed in this encounter.   Orders Placed This Encounter  Procedures   MR LUMBAR SPINE WO CONTRAST    Follow-up: Return for Lumbar MRI review.   Procedures: No procedures performed      Clinical History: No specialty comments available.   He reports that he quit smoking about 13 years ago. His smoking use included cigarettes. He has been exposed to tobacco smoke. He has quit using smokeless tobacco.  His smokeless tobacco use included chew. No results for input(s): HGBA1C, LABURIC in the last 8760 hours.  Objective:  VS:  HT:    WT:   BMI:     BP:   HR: bpm  TEMP: ( )  RESP:  Physical Exam Vitals and nursing note reviewed.  HENT:     Head: Normocephalic and atraumatic.     Right Ear: External ear normal.     Left Ear: External ear normal.     Nose: Nose normal.  Mouth/Throat:     Mouth: Mucous membranes are moist.  Eyes:     Extraocular Movements: Extraocular movements intact.  Cardiovascular:     Rate and Rhythm: Normal rate.     Pulses: Normal pulses.  Pulmonary:     Effort: Pulmonary effort is normal.  Abdominal:     General: Abdomen is flat. There is no distension.  Musculoskeletal:        General: Tenderness present.     Cervical back: Normal range of motion.     Comments: Patient rises from seated position to standing without difficulty. Pain noted  with facet loading and lumbar extension. 5/5 strength noted with bilateral hip flexion, knee flexion/extension, ankle dorsiflexion/plantarflexion and EHL. No clonus noted bilaterally. No pain upon palpation of greater trochanters. No pain with internal/external rotation of bilateral hips. Sensation intact bilaterally. Myofascial tenderness noted to right lumbar paraspinal region upon palpation. Dysesthesias noted to right L5 dermatome. Equivocally positive slump test on the right. Ambulates without aid, gait steady.     Skin:    General: Skin is warm and dry.     Capillary Refill: Capillary refill takes less than 2 seconds.  Neurological:     General: No focal deficit present.     Mental Status: He is alert and oriented to person, place, and time.  Psychiatric:        Mood and Affect: Mood normal.        Behavior: Behavior normal.     Ortho Exam  Imaging: No results found.  Past Medical/Family/Surgical/Social History: Medications & Allergies reviewed per EMR, new medications updated. Patient Active Problem List   Diagnosis Date Noted   Carpal tunnel syndrome, right upper limb 10/11/2023   Primary osteoarthritis of right hip 08/15/2023   Status post total replacement of right hip 08/15/2023   Umbilical hernia without obstruction and without gangrene 04/07/2022   Rectus diastasis 04/07/2022   Seropositive rheumatoid arthritis (HCC) 02/12/2022   High risk medication use 02/12/2022   Abscess of dorsum of left hand 01/26/2022   Extensor tenosynovitis of left wrist 12/01/2021   Primary osteoarthritis of right wrist 04/26/2017   Radiculopathy of cervical spine 04/26/2017   Total knee replacement status 01/27/2017   Primary osteoarthritis of right shoulder 12/30/2016   Primary osteoarthritis of right knee 12/30/2016   Insomnia 03/10/2012   BPH (benign prostatic hyperplasia) 12/09/2011   GERD (gastroesophageal reflux disease) 12/09/2011   Past Medical History:  Diagnosis Date    Anxiety    Arthritis    GERD (gastroesophageal reflux disease)    History of kidney stones    Pneumonia    Rheumatoid arthritis (HCC)    Family History  Problem Relation Age of Onset   Rheum arthritis Father    Past Surgical History:  Procedure Laterality Date   COLONOSCOPY  2015   FINGER SURGERY Left    index   HAND SURGERY Left 04/04/2023   HERNIA REPAIR  04/30/2022   I & D EXTREMITY Left 01/27/2022   Procedure: IRRIGATION AND DEBRIDEMENT LEFT HAND;  Surgeon: Jerri Kay HERO, MD;  Location: [facility];  Service: Orthopedics;  Laterality: Left;   I & D EXTREMITY Left 01/29/2022   Procedure: REPEAT IRRIGATION AND DEBRIDEMENT OF HAND;  Surgeon: Jerri Kay HERO, MD;  Location: [facility];  Service: Orthopedics;  Laterality: Left;   JOINT REPLACEMENT     left hip replaced   KNEE ARTHROSCOPY W/ ACL RECONSTRUCTION Right 2009   LEG SURGERY Left    left calf  MINOR APPLICATION OF WOUND VAC  01/29/2022   Procedure: APPLICATION OF WOUND VAC;  Surgeon: Jerri Kay HERO, MD;  Location: [facility];  Service: Orthopedics;;   ORIF FINGER / THUMB FRACTURE     TOTAL HIP ARTHROPLASTY Right 08/15/2023   Procedure: ARTHROPLASTY, HIP, TOTAL, ANTERIOR APPROACH;  Surgeon: Jerri Kay HERO, MD;  Location: [facility];  Service: Orthopedics;  Laterality: Right;  3-C   TOTAL KNEE ARTHROPLASTY Right 01/27/2017   Procedure: RIGHT TOTAL KNEE ARTHROPLASTY;  Surgeon: Jerri Kay HERO, MD;  Location: [facility];  Service: Orthopedics;  Laterality: Right;   Social History   Occupational History   Not on file  Tobacco Use   Smoking status: Former    Current packs/day: 0.00    Types: Cigarettes    Quit date: 04/21/2010    Years since quitting: 13.5    Passive exposure: Past   Smokeless tobacco: Former    Types: Engineer, drilling   Vaping status: Former  Substance and Sexual Activity   Alcohol use: No   Drug use: No   Sexual activity: Not on file

## 2023-11-15 NOTE — Therapy (Unsigned)
 OUTPATIENT PHYSICAL THERAPY EVALUATION   Patient Name: Taylor Heath MRN: [id number] DOB:1960-03-22, 63 y.o., male Today's Date: 11/16/2023  END OF SESSION:  PT End of Session - 11/16/23 0956     Visit Number 1    Number of Visits 13    Date for PT Re-Evaluation 01/10/24    PT Start Time 1150    PT Stop Time 1230    PT Time Calculation (min) 40 min    Activity Tolerance Patient tolerated treatment well    Behavior During Therapy [facility] for tasks assessed/performed          Past Medical History:  Diagnosis Date   Anxiety    Arthritis    GERD (gastroesophageal reflux disease)    History of kidney stones    Pneumonia    Rheumatoid arthritis (HCC)    Past Surgical History:  Procedure Laterality Date   COLONOSCOPY  2015   FINGER SURGERY Left    index   HAND SURGERY Left 04/04/2023   HERNIA REPAIR  04/30/2022   I & D EXTREMITY Left 01/27/2022   Procedure: IRRIGATION AND DEBRIDEMENT LEFT HAND;  Surgeon: Jerri Kay HERO, MD;  [facility];  Service: Orthopedics;  Laterality: Left;   I & D EXTREMITY Left 01/29/2022   Procedure: REPEAT IRRIGATION AND DEBRIDEMENT OF HAND;  Surgeon: Jerri Kay HERO, MD;  Location: [facility];  Service: Orthopedics;  Laterality: Left;   JOINT REPLACEMENT     left hip replaced   KNEE ARTHROSCOPY W/ ACL RECONSTRUCTION Right 2009   LEG SURGERY Left    left calf   MINOR APPLICATION OF WOUND VAC  01/29/2022   Procedure: APPLICATION OF WOUND VAC;  Surgeon: Jerri Kay HERO, MD;  Location: [facility];  Service: Orthopedics;;   ORIF FINGER / THUMB FRACTURE     TOTAL HIP ARTHROPLASTY Right 08/15/2023   Procedure: ARTHROPLASTY, HIP, TOTAL, ANTERIOR APPROACH;  Surgeon: Jerri Kay HERO, MD;  [facility];  Service: Orthopedics;  Laterality: Right;  3-C   TOTAL KNEE ARTHROPLASTY Right 01/27/2017   Procedure: RIGHT TOTAL KNEE ARTHROPLASTY;  Surgeon: Jerri Kay HERO, MD;  [facility];  Service: Orthopedics;  Laterality: Right;   Patient Active Problem List    Diagnosis Date Noted   Carpal tunnel syndrome, right upper limb 10/11/2023   Primary osteoarthritis of right hip 08/15/2023   Status post total replacement of right hip 08/15/2023   Umbilical hernia without obstruction and without gangrene 04/07/2022   Rectus diastasis 04/07/2022   Seropositive rheumatoid arthritis (HCC) 02/12/2022   High risk medication use 02/12/2022   Abscess of dorsum of left hand 01/26/2022   Extensor tenosynovitis of left wrist 12/01/2021   Primary osteoarthritis of right wrist 04/26/2017   Radiculopathy of cervical spine 04/26/2017   Total knee replacement status 01/27/2017   Primary osteoarthritis of right shoulder 12/30/2016   Primary osteoarthritis of right knee 12/30/2016   Insomnia 03/10/2012   BPH (benign prostatic hyperplasia) 12/09/2011   GERD (gastroesophageal reflux disease) 12/09/2011    PCP: Bertell Satterfield , MD  REFERRING PROVIDER: Jule Ronal CROME, PA-C  REFERRING DIAG: M54.50 (ICD-10-CM) - Low back pain, unspecified back pain laterality, unspecified chronicity, unspecified whether sciatica present  Rationale for Evaluation and Treatment: Rehabilitation  THERAPY DIAG:  Other idiopathic scoliosis, lumbar region  Other low back pain  ONSET DATE: 2+ years with recent increase  SUBJECTIVE:  SUBJECTIVE STATEMENT: Pt reports  R sided low back and hip pain for 2-3 years.  He had a THA R in June and now he reports no groin pain and less ambulation pain.   Currently c/o R lumbar/buttuck pain with walking .  He states prednisone  dose pack that he started a week ago has helped decrease the pain.    PERTINENT HISTORY:  Chronic back pain complaints, arthritis, anxiety, GERD, RA  PAIN:  NPRS scale: 2/10-8/10. Pain location: R SI jt area Pain description: sharp and  dull Aggravating factors: transfers, walking Relieving factors: rest  PRECAUTIONS: None  WEIGHT BEARING RESTRICTIONS: No  FALLS:  Has patient fallen in last 6 months? No  LIVING ENVIRONMENT: Lives with: lives with their family Lives in: House/apartment Stairs: No Has following equipment at home: None  OCCUPATION: retired  PLOF: Independent  PATIENT GOALS: decrease pain and return to golf  Next MD Visit:    OBJECTIVE:   DIAGNOSTIC FINDINGS:  MRI pending  PATIENT SURVEYS:  Patient-Specific Activity Scoring Scheme  0 represents "unable to perform." 10 represents "able to perform at prior level. 0 1 2 3 4 5 6 7 8 9  10 (Date and Score)   Activity Eval  11/15/2023    1. walking 6     2. bending to pick up objects  5    3. Getting OOB 5   4.    5.    Score 5.33    Total score = sum of the activity scores/number of activities Minimum detectable change (90%CI) for average score = 2 points Minimum detectable change (90%CI) for single activity score = 3 points  SCREENING FOR RED FLAGS: 11/15/2023 Bowel or bladder incontinence: No  COGNITION: 11/15/2023 Overall cognitive status: WFL normal      SENSATION: 11/15/2023 [facility]  MUSCLE LENGTH: 11/15/2023 Hamstrings: Right  WNL Left WNL  POSTURE:  11/15/2023 weight shift right and genu L ,lumbar lordosis dec, R PSIS post, R shoulder lower  PALPATION: 11/15/2023 1+ tenderness at R SI joint   AROM Eval 11/15/2023  Flexion 100%  Extension   Right lateral flexion   Left lateral flexion 75%  Right rotation   Left rotation 75%   (Blank rows = not tested)  LOWER EXTREMITY ROM:     Active  Right Eval 11/15/2023 Left Eval 11/15/2023  Hip flexion [facility]  Hip extension    Hip abduction    Hip adduction    Hip internal rotation    Hip external rotation    Knee flexion    Knee extension    Ankle dorsiflexion    Ankle plantarflexion    Ankle inversion    Ankle eversion     (Blank rows = not  tested)  LOWER EXTREMITY MMT:  4+  MMT Right Eval 11/15/2023 Left Eval 11/15/2023  Hip flexion 4 4  Hip extension 4 4  Hip abduction 4 4  Hip adduction    Hip internal rotation    Hip external rotation    Knee flexion    Knee extension    Ankle dorsiflexion    Ankle plantarflexion    Ankle inversion    Ankle eversion     (Blank rows = not tested)  LUMBAR SPECIAL TESTS:  11/15/2023 Straight leg raise test: Negative  FUNCTIONAL TESTS:  11/15/2023 none  GAIT: 11/15/2023  TODAY'S TREATMENT:                                                                                                         DATE: 11/15/2023  Initial evaluation completed.  Pt to return this week for HEP due to time constraints.  PATIENT EDUCATION:  11/15/2023 Education details: HEP, POC Person educated: Patient Education method: Programmer, multimedia, Demonstration, Verbal cues, and Handouts Education comprehension: verbalized understanding, returned demonstration, and verbal cues required  HOME EXERCISE PROGRAM: Pending  ASSESSMENT:  CLINICAL IMPRESSION: Patient is a 63 y.o. who comes to clinic with complaints of R lumbar and buttuck pain.  He presents with moderate to severe scoliosis, mobility, strength and movement coordination deficits that impair their ability to perform usual daily and recreational functional activities without increase difficulty/symptoms at this time.  Patient to benefit from skilled PT services to address impairments and limitations to improve to previous level of function without restriction secondary to condition.   OBJECTIVE IMPAIRMENTS: Abnormal gait, decreased balance, difficulty walking, decreased ROM, decreased strength, increased muscle spasms, improper body mechanics, and  pain.   ACTIVITY LIMITATIONS: lifting, sitting, standing, squatting, and transfers  PARTICIPATION LIMITATIONS: yard work  PERSONAL FACTORS: 1 comorbidity: previous THA are also affecting patient's functional outcome.   REHAB POTENTIAL: Good  CLINICAL DECISION MAKING: Stable/uncomplicated  EVALUATION COMPLEXITY: Moderate   GOALS: Goals reviewed with patient? Yes  SHORT TERM GOALS: (target date for Short term goals are 3 weeks 12/06/2023)  1. Patient will demonstrate independent use of home exercise program to maintain progress from in clinic treatments.  Goal status: New  LONG TERM GOALS: 01/10/24     1. Patient will demonstrate/report pain at worst less than or equal to 2/10 to facilitate minimal limitation in daily activity secondary to pain symptoms.  Goal status: New   2. Patient will demonstrate independent use of home exercise program to facilitate ability to maintain/progress functional gains from skilled physical therapy services.  Goal status: New   3. Patient will demonstrate Patient specific functional scale by 2 points or more. indicate reduced disability due to condition.   Goal status: New   4. Patient will improve coordination of posture by 25 % through stabilization exercises.  Goal status: New   5.  Reduce pain with ambulation to 3/10.  Goal status: New   PLAN:  PT FREQUENCY: 1-2x/week  PT DURATION: 8 weeks  PLANNED INTERVENTIONS: Can include 02853- PT Re-evaluation, 97110-Therapeutic exercises, 97530- Therapeutic activity, [id number]- Neuromuscular re-education, 97535- Self Care, 97140- Manual therapy, [phone]- Gait training, [phone]- Orthotic Fit/training, [phone]- Canalith repositioning, [id number]- Aquatic Therapy, [phone]- Electrical stimulation (unattended), [id number] Physical performance testing, 97016- Vasopneumatic device, [id number]- Ultrasound, [id number]- Traction (mechanical), [id number]- Ionotophoresis 4mg /ml Dexamethasone ,  [id number] - Needle insertion w/o injection 1 or 2  muscles, 20561 - Needle insertion w/o injection 3 or more muscles.    Patient/Family education, Balance training, Stair training, Taping, Dry Needling, Joint mobilization, Joint manipulation, Spinal manipulation, Spinal mobilization, Scar mobilization, Vestibular training, Visual/preceptual remediation/compensation, DME instructions, Cryotherapy, and Moist heat.  All performed as medically necessary.  All included  unless contraindicated  PLAN FOR NEXT SESSION: Instruct in HEP.   Burnard Meth, PT 11/16/23  9:58 AM

## 2023-11-15 NOTE — Progress Notes (Signed)
 Post-Op Visit Note   Patient: Taylor Heath           Date of Birth: 1960-03-07           MRN: [id number] Visit Date: 11/15/2023 PCP: Bertell Satterfield, MD   Assessment & Plan:  Chief Complaint:  Chief Complaint  Patient presents with   Right Hip - Follow-up    Right total hip arthroplasty 08/15/2023   Visit Diagnoses:  1. History of total hip replacement, right     Plan: Patient is a pleasant 63 year old gentleman who comes in today 3 months status post right total hip replacement 08/15/2023.  He is doing well in regards to the hip.  His main concern is been his right lower back for which she has recently seen Duwaine Pouch for.  He tells me he is being scheduled for an MRI.  Examination of his right hip reveals painless hip flexion and logroll.  He is neurovascularly intact distally.  At this point, he will continue to advance his activity as tolerated.  Follow-up in 3 months for repeat evaluation and AP pelvis x-rays.  Of note, he asked me for refill of medication.  I have agreed to refill his Robaxin  and I sent in Tylenol  3.  If he needs anything more, he will need to contact Megan as this is for his back at this time.  Follow-Up Instructions: Return in about 3 months (around 02/14/2024).   Orders:  No orders of the defined types were placed in this encounter.  Meds ordered this encounter  Medications   methocarbamol  (ROBAXIN ) 750 MG tablet    Sig: Take 1 tablet (750 mg total) by mouth 2 (two) times daily as needed.    Dispense:  20 tablet    Refill:  0   acetaminophen -codeine  (TYLENOL  #3) 300-30 MG tablet    Sig: Take 1 tablet by mouth 2 (two) times daily as needed.    Dispense:  20 tablet    Refill:  0    Imaging: No new imaging  PMFS History: Patient Active Problem List   Diagnosis Date Noted   Carpal tunnel syndrome, right upper limb 10/11/2023   Primary osteoarthritis of right hip 08/15/2023   Status post total replacement of right hip 08/15/2023   Umbilical  hernia without obstruction and without gangrene 04/07/2022   Rectus diastasis 04/07/2022   Seropositive rheumatoid arthritis (HCC) 02/12/2022   High risk medication use 02/12/2022   Abscess of dorsum of left hand 01/26/2022   Extensor tenosynovitis of left wrist 12/01/2021   Primary osteoarthritis of right wrist 04/26/2017   Radiculopathy of cervical spine 04/26/2017   Total knee replacement status 01/27/2017   Primary osteoarthritis of right shoulder 12/30/2016   Primary osteoarthritis of right knee 12/30/2016   Insomnia 03/10/2012   BPH (benign prostatic hyperplasia) 12/09/2011   GERD (gastroesophageal reflux disease) 12/09/2011   Past Medical History:  Diagnosis Date   Anxiety    Arthritis    GERD (gastroesophageal reflux disease)    History of kidney stones    Pneumonia    Rheumatoid arthritis (HCC)     Family History  Problem Relation Age of Onset   Rheum arthritis Father     Past Surgical History:  Procedure Laterality Date   COLONOSCOPY  2015   FINGER SURGERY Left    index   HAND SURGERY Left 04/04/2023   HERNIA REPAIR  04/30/2022   I & D EXTREMITY Left 01/27/2022   Procedure: IRRIGATION AND DEBRIDEMENT LEFT HAND;  Surgeon: Jerri Kay HERO, MD;  Location: [facility] OR;  Service: Orthopedics;  Laterality: Left;   I & D EXTREMITY Left 01/29/2022   Procedure: REPEAT IRRIGATION AND DEBRIDEMENT OF HAND;  Surgeon: Jerri Kay HERO, MD;  [facility];  Service: Orthopedics;  Laterality: Left;   JOINT REPLACEMENT     left hip replaced   KNEE ARTHROSCOPY W/ ACL RECONSTRUCTION Right 2009   LEG SURGERY Left    left calf   MINOR APPLICATION OF WOUND VAC  01/29/2022   Procedure: APPLICATION OF WOUND VAC;  Surgeon: Jerri Kay HERO, MD;  [facility];  Service: Orthopedics;;   ORIF FINGER / THUMB FRACTURE     TOTAL HIP ARTHROPLASTY Right 08/15/2023   Procedure: ARTHROPLASTY, HIP, TOTAL, ANTERIOR APPROACH;  Surgeon: Jerri Kay HERO, MD;  [facility];  Service: Orthopedics;   Laterality: Right;  3-C   TOTAL KNEE ARTHROPLASTY Right 01/27/2017   Procedure: RIGHT TOTAL KNEE ARTHROPLASTY;  Surgeon: Jerri Kay HERO, MD;  [facility];  Service: Orthopedics;  Laterality: Right;   Social History   Occupational History   Not on file  Tobacco Use   Smoking status: Former    Current packs/day: 0.00    Types: Cigarettes    Quit date: 04/21/2010    Years since quitting: 13.5    Passive exposure: Past   Smokeless tobacco: Former    Types: Engineer, drilling   Vaping status: Former  Substance and Sexual Activity   Alcohol use: No   Drug use: No   Sexual activity: Not on file

## 2023-11-15 NOTE — Telephone Encounter (Signed)
 Patient would like an injection next week.

## 2023-11-15 NOTE — Progress Notes (Signed)
 Pain Scale   Average Pain 9 Patient advising he has chronic lower back pain radiating to right leg and foot, had hip replacement in June and recovered great however his lower back pain increases after hip surgery        +Driver, -BT, -Dye Allergies.

## 2023-11-16 ENCOUNTER — Ambulatory Visit (INDEPENDENT_AMBULATORY_CARE_PROVIDER_SITE_OTHER): Payer: Commercial Managed Care - HMO | Admitting: Urology

## 2023-11-16 ENCOUNTER — Telehealth: Payer: Self-pay | Admitting: Physical Medicine and Rehabilitation

## 2023-11-16 ENCOUNTER — Encounter: Payer: Self-pay | Admitting: Urology

## 2023-11-16 VITALS — BP 143/81 | HR 92

## 2023-11-16 DIAGNOSIS — R972 Elevated prostate specific antigen [PSA]: Secondary | ICD-10-CM

## 2023-11-16 LAB — URINALYSIS, ROUTINE W REFLEX MICROSCOPIC
Bilirubin, UA: NEGATIVE
Glucose, UA: NEGATIVE
Ketones, UA: NEGATIVE
Leukocytes,UA: NEGATIVE
Nitrite, UA: NEGATIVE
Protein,UA: NEGATIVE
RBC, UA: NEGATIVE
Specific Gravity, UA: <1.005 — ABNORMAL LOW (ref 1.005–1.030)
Urobilinogen, Ur: 0.2 mg/dL (ref 0.2–1.0)
pH, UA: 6 (ref 5.0–7.5)

## 2023-11-16 NOTE — Progress Notes (Signed)
 11/16/2023 9:21 AM   Taylor Heath 09/15/1960 [id number]  Referring provider: Bertell Satterfield, MD [address] [place],  [place] [address]  No chief complaint on file.   HPI: Mr Escue is a 63yo here for followup for elevated PSA. PSA 2.8 which has decreased from 3.0. IPSS 7 QOl 1 on no BPh medication. Nocturia 1x. Uirne stream strong. No straining to urinate.    PMH: Past Medical History:  Diagnosis Date   Anxiety    Arthritis    GERD (gastroesophageal reflux disease)    History of kidney stones    Pneumonia    Rheumatoid arthritis [facility])     Surgical History: Past Surgical History:  Procedure Laterality Date   COLONOSCOPY  2015   FINGER SURGERY Left    index   HAND SURGERY Left 04/04/2023   HERNIA REPAIR  04/30/2022   I & D EXTREMITY Left 01/27/2022   Procedure: IRRIGATION AND DEBRIDEMENT LEFT HAND;  Surgeon: Jerri Kay HERO, MD;  [facility];  Service: Orthopedics;  Laterality: Left;   I & D EXTREMITY Left 01/29/2022   Procedure: REPEAT IRRIGATION AND DEBRIDEMENT OF HAND;  Surgeon: Jerri Kay HERO, MD;  [facility];  Service: Orthopedics;  Laterality: Left;   JOINT REPLACEMENT     left hip replaced   KNEE ARTHROSCOPY W/ ACL RECONSTRUCTION Right 2009   LEG SURGERY Left    left calf   MINOR APPLICATION OF WOUND VAC  01/29/2022   Procedure: APPLICATION OF WOUND VAC;  Surgeon: Jerri Kay HERO, MD;  [facility];  Service: Orthopedics;;   ORIF FINGER / THUMB FRACTURE     TOTAL HIP ARTHROPLASTY Right 08/15/2023   Procedure: ARTHROPLASTY, HIP, TOTAL, ANTERIOR APPROACH;  Surgeon: Jerri Kay HERO, MD;  [facility];  Service: Orthopedics;  Laterality: Right;  3-C   TOTAL KNEE ARTHROPLASTY Right 01/27/2017   Procedure: RIGHT TOTAL KNEE ARTHROPLASTY;  Surgeon: Jerri Kay HERO, MD;  [facility];  Service: Orthopedics;  Laterality: Right;    Home Medications:  Allergies as of 11/16/2023       Reactions   Tramadol  Nausea And Vomiting   Meperidine     Unknown reaction         Medication List        Accurate as of November 16, 2023  9:21 AM. If you have any questions, ask your nurse or doctor.          acetaminophen -codeine  300-30 MG tablet Commonly known as: TYLENOL  #3 Take 1 tablet by mouth 2 (two) times daily as needed.   Aspirin  Low Dose 81 MG chewable tablet Generic drug: aspirin  Chew 1 tablet (81 mg total) by mouth 2 (two) times daily. To be taken after surgery to prevent blood clots   diclofenac  75 MG EC tablet Commonly known as: VOLTAREN  TAKE 1/2 TABLET TWICE DAILY AS NEEDED   docusate sodium  100 MG capsule Commonly known as: Colace Take 1 capsule (100 mg total) by mouth daily as needed.   doxycycline  100 MG tablet Commonly known as: VIBRA -TABS Take 1 tablet (100 mg total) by mouth 2 (two) times daily. To be taken after surgery   folic acid  1 MG tablet Commonly known as: FOLVITE  TAKE 1 TABLET BY MOUTH EVERY DAY   HYDROcodone -acetaminophen  5-325 MG tablet Commonly known as: NORCO/VICODIN Take 1 tablet by mouth 2 (two) times daily as needed for moderate pain (pain score 4-6).   methocarbamol  750 MG tablet Commonly known as: ROBAXIN  TAKE 1  TABLET BY MOUTH 3 TIMES DAILY AS NEEDED.   methocarbamol  750 MG tablet Commonly known as: ROBAXIN  Take 1 tablet (750 mg total) by mouth 2 (two) times daily as needed.   methotrexate  2.5 MG tablet Commonly known as: RHEUMATREX TAKE 6 TABLETS (15 MG TOTAL) BY MOUTH ONCE A WEEK. CAUTION:CHEMOTHERAPY. PROTECT FROM LIGHT.   MULTIVITAMIN PO Take 1 tablet daily by mouth.   omeprazole  20 MG tablet Commonly known as: PRILOSEC  OTC Take 20 mg daily by mouth.   ondansetron  4 MG tablet Commonly known as: Zofran  Take 1 tablet (4 mg total) by mouth every 8 (eight) hours as needed for nausea or vomiting.   oxyCODONE -acetaminophen  5-325 MG tablet Commonly known as: Percocet Take 1-2 tablets by mouth every 6 (six) hours as needed.   pravastatin  40 MG tablet Commonly  known as: PRAVACHOL  Take 40 mg daily by mouth.   predniSONE  10 MG (21) Tbpk tablet Commonly known as: STERAPRED UNI-PAK 21 TAB Take as directed   valACYclovir  500 MG tablet Commonly known as: VALTREX  Take 500 mg daily by mouth.   vitamin C 250 MG tablet Commonly known as: ASCORBIC ACID Take 250 mg by mouth daily.        Allergies:  Allergies  Allergen Reactions   Tramadol  Nausea And Vomiting   Meperidine     Unknown reaction     Family History: Family History  Problem Relation Age of Onset   Rheum arthritis Father     Social History:  reports that he quit smoking about 13 years ago. His smoking use included cigarettes. He has been exposed to tobacco smoke. He has quit using smokeless tobacco.  His smokeless tobacco use included chew. He reports that he does not drink alcohol and does not use drugs.  ROS: All other review of systems were reviewed and are negative except what is noted above in HPI  Physical Exam: BP (!) 143/81   Pulse 92   Constitutional:  Alert and oriented, No acute distress. HEENT: [place] AT, moist mucus membranes.  Trachea midline, no masses. Cardiovascular: No clubbing, cyanosis, or edema. Respiratory: Normal respiratory effort, no increased work of breathing. GI: Abdomen is soft, nontender, nondistended, no abdominal masses GU: No CVA tenderness.  Lymph: No cervical or inguinal lymphadenopathy. Skin: No rashes, bruises or suspicious lesions. Neurologic: Grossly intact, no focal deficits, moving all 4 extremities. Psychiatric: Normal mood and affect.  Laboratory Data: Lab Results  Component Value Date   WBC 7.0 10/11/2023   HGB 14.4 10/11/2023   HCT 44.0 10/11/2023   MCV 94.8 10/11/2023   PLT 321 10/11/2023    Lab Results  Component Value Date   CREATININE 0.80 10/11/2023    No results found for: PSA  No results found for: TESTOSTERONE  No results found for: HGBA1C  Urinalysis    Component Value Date/Time   COLORURINE  STRAW (A) 01/27/2017 1038   APPEARANCEUR Clear 03/19/2022 1118   LABSPEC 1.008 01/27/2017 1038   PHURINE 6.0 01/27/2017 1038   GLUCOSEU Negative 03/19/2022 1118   HGBUR MODERATE (A) 01/27/2017 1038   BILIRUBINUR Negative 03/19/2022 1118   KETONESUR NEGATIVE 01/27/2017 1038   PROTEINUR 1+ (A) 03/19/2022 1118   PROTEINUR NEGATIVE 01/27/2017 1038   NITRITE Negative 03/19/2022 1118   NITRITE NEGATIVE 01/27/2017 1038   LEUKOCYTESUR Negative 03/19/2022 1118    Lab Results  Component Value Date   LABMICR See below: 03/19/2022   WBCUA 0-5 03/19/2022   LABEPIT 0-10 03/19/2022   BACTERIA None seen 03/19/2022  Pertinent Imaging:  No results found for this or any previous visit.  No results found for this or any previous visit.  No results found for this or any previous visit.  No results found for this or any previous visit.  No results found for this or any previous visit.  No results found for this or any previous visit.  No results found for this or any previous visit.  No results found for this or any previous visit.   Assessment & Plan:    1. Elevated PSA (Primary) Followup 1 year with psa - Urinalysis, Routine w reflex microscopic   No follow-ups on file.  Belvie Clara, MD  [facility] 

## 2023-11-16 NOTE — Therapy (Signed)
 OUTPATIENT PHYSICAL THERAPY TREATMENT   Patient Name: Taylor Heath MRN: [id number] DOB:[birth year], 63 y.o., male Today's Date: 11/17/2023  END OF SESSION:  PT End of Session - 11/17/23 1233     Visit Number 2    Number of Visits 13    Date for Recertification  01/10/24    PT Start Time 1145    PT Stop Time 1225    PT Time Calculation (min) 40 min    Activity Tolerance Patient tolerated treatment well    Behavior During Therapy [facility] for tasks assessed/performed           Past Medical History:  Diagnosis Date   Anxiety    Arthritis    GERD (gastroesophageal reflux disease)    History of kidney stones    Pneumonia    Rheumatoid arthritis (HCC)    Past Surgical History:  Procedure Laterality Date   COLONOSCOPY  2015   FINGER SURGERY Left    index   HAND SURGERY Left 04/04/2023   HERNIA REPAIR  04/30/2022   I & D EXTREMITY Left 01/27/2022   Procedure: IRRIGATION AND DEBRIDEMENT LEFT HAND;  Surgeon: Jerri Kay HERO, MD;  Location: [facility] OR;  Service: Orthopedics;  Laterality: Left;   I & D EXTREMITY Left 01/29/2022   Procedure: REPEAT IRRIGATION AND DEBRIDEMENT OF HAND;  Surgeon: Jerri Kay HERO, MD;  Location: [facility] OR;  Service: Orthopedics;  Laterality: Left;   JOINT REPLACEMENT     left hip replaced   KNEE ARTHROSCOPY W/ ACL RECONSTRUCTION Right 2009   LEG SURGERY Left    left calf   MINOR APPLICATION OF WOUND VAC  01/29/2022   Procedure: APPLICATION OF WOUND VAC;  Surgeon: Jerri Kay HERO, MD;  Location: [facility] OR;  Service: Orthopedics;;   ORIF FINGER / THUMB FRACTURE     TOTAL HIP ARTHROPLASTY Right 08/15/2023   Procedure: ARTHROPLASTY, HIP, TOTAL, ANTERIOR APPROACH;  Surgeon: Jerri Kay HERO, MD;  Location: [facility];  Service: Orthopedics;  Laterality: Right;  3-C   TOTAL KNEE ARTHROPLASTY Right 01/27/2017   Procedure: RIGHT TOTAL KNEE ARTHROPLASTY;  Surgeon: Jerri Kay HERO, MD;  Location: [facility];  Service: Orthopedics;  Laterality: Right;   Patient Active Problem List    Diagnosis Date Noted   Carpal tunnel syndrome, right upper limb 10/11/2023   Primary osteoarthritis of right hip 08/15/2023   Status post total replacement of right hip 08/15/2023   Umbilical hernia without obstruction and without gangrene 04/07/2022   Rectus diastasis 04/07/2022   Seropositive rheumatoid arthritis (HCC) 02/12/2022   High risk medication use 02/12/2022   Abscess of dorsum of left hand 01/26/2022   Extensor tenosynovitis of left wrist 12/01/2021   Primary osteoarthritis of right wrist 04/26/2017   Radiculopathy of cervical spine 04/26/2017   Total knee replacement status 01/27/2017   Primary osteoarthritis of right shoulder 12/30/2016   Primary osteoarthritis of right knee 12/30/2016   Insomnia 03/10/2012   BPH (benign prostatic hyperplasia) 12/09/2011   GERD (gastroesophageal reflux disease) 12/09/2011    PCP: Bertell Satterfield , MD  REFERRING PROVIDER: Jule Ronal CROME, PA-C  REFERRING DIAG: M54.50 (ICD-10-CM) - Low back pain, unspecified back pain laterality, unspecified chronicity, unspecified whether sciatica present  Rationale for Evaluation and Treatment: Rehabilitation  THERAPY DIAG:  Other idiopathic scoliosis, lumbar region  Other low back pain  Muscle weakness (generalized)  ONSET DATE: 2+ years with recent increase  SUBJECTIVE:  SUBJECTIVE STATEMENT: Pt reports MRI was denied.  He has increased his visits for PT.    Currently c/o R lumbar/buttuck pain with walking .  He states prednisone  dose pack that he started a week ago has helped decrease the pain.    PERTINENT HISTORY:  Chronic back pain complaints, arthritis, anxiety, GERD, RA  PAIN:  NPRS scale: 2/10-8/10. Pain location: R SI jt area Pain description: sharp and dull Aggravating factors: transfers,  walking Relieving factors: rest  PRECAUTIONS: None  WEIGHT BEARING RESTRICTIONS: No  FALLS:  Has patient fallen in last 6 months? No  LIVING ENVIRONMENT: Lives with: lives with their family Lives in: House/apartment Stairs: No Has following equipment at home: None  OCCUPATION: retired  PLOF: Independent  PATIENT GOALS: decrease pain and return to golf  Next MD Visit:    OBJECTIVE:   DIAGNOSTIC FINDINGS:  MRI pending  PATIENT SURVEYS:  Patient-Specific Activity Scoring Scheme  0 represents "unable to perform." 10 represents "able to perform at prior level. 0 1 2 3 4 5 6 7 8 9  10 (Date and Score)   Activity Eval  11/15/2023    1. walking 6     2. bending to pick up objects  5    3. Getting OOB 5   4.    5.    Score 5.33    Total score = sum of the activity scores/number of activities Minimum detectable change (90%CI) for average score = 2 points Minimum detectable change (90%CI) for single activity score = 3 points  SCREENING FOR RED FLAGS: 11/15/2023 Bowel or bladder incontinence: No  COGNITION: 11/15/2023 Overall cognitive status: WFL normal      SENSATION: 11/15/2023 [facility]  MUSCLE LENGTH: 11/15/2023 Hamstrings: Right  WNL Left WNL  POSTURE:  11/15/2023 weight shift right and genu L ,lumbar lordosis dec, R PSIS post, R shoulder lower  PALPATION: 11/15/2023 1+ tenderness at R SI joint   AROM Eval 11/15/2023  Flexion 100%  Extension   Right lateral flexion   Left lateral flexion 75%  Right rotation   Left rotation 75%   (Blank rows = not tested)  LOWER EXTREMITY ROM:     Active  Right Eval 11/15/2023 Left Eval 11/15/2023  Hip flexion [facility]  Hip extension    Hip abduction    Hip adduction    Hip internal rotation    Hip external rotation    Knee flexion    Knee extension    Ankle dorsiflexion    Ankle plantarflexion    Ankle inversion    Ankle eversion     (Blank rows = not tested)  LOWER EXTREMITY MMT:  4+  MMT  Right Eval 11/15/2023 Left Eval 11/15/2023  Hip flexion 4 4  Hip extension 4 4  Hip abduction 4 4  Hip adduction    Hip internal rotation    Hip external rotation    Knee flexion    Knee extension    Ankle dorsiflexion    Ankle plantarflexion    Ankle inversion    Ankle eversion     (Blank rows = not tested)  LUMBAR SPECIAL TESTS:  11/15/2023 Straight leg raise test: Negative  FUNCTIONAL TESTS:  11/15/2023 none  GAIT: 11/15/2023  TODAY'S TREATMENT:                                                                                                          DATE: 11/17/23 Nustep level 6 6 min  Tband hip abd 3x10  Access Code: [email or web address] URL: [email or web address] Date: 11/17/2023 Prepared by: Burnard Meth  Exercises - Supine Bridge  - 2 x daily - 7 x weekly - 2 sets - 10 reps - Supine 90/90 Alternating Toe Touch  - 2 x daily - 7 x weekly - 2 sets - 10 reps - Side Plank on Knees  - 2 x daily - 7 x weekly - 2 sets - 10 reps - Prone Alternating Arm and Leg Lifts  - 2 x daily - 7 x weekly - 2 sets - 10 reps - Swiss Ball March  - 2 x daily - 7 x weekly - 2 sets - 10 reps - Diagonal Forward Reaches  - 2 x daily - 7 x weekly - 2 sets - 10 reps - Prone Bilateral Arm and Leg Lift  - 2 x daily - 7 x weekly - 2 sets - 10 reps - Hip Hiking on Step  - 2 x daily - 7 x weekly - 2 sets - 10 reps - Hooklying Clamshell with Resistance  - 2 x daily - 7 x weekly - 2 sets - 10 reps  DATE: 11/15/2023  Initial evaluation completed.  Pt to return this week for HEP due to time constraints.  PATIENT EDUCATION:  11/15/2023 Education details: HEP, POC Person educated: Patient Education method: Programmer, multimedia, Demonstration, Verbal cues, and Handouts Education comprehension:  verbalized understanding, returned demonstration, and verbal cues required  HOME EXERCISE PROGRAM: Pending  ASSESSMENT:  CLINICAL IMPRESSION: Patient needed VC for proper form with all exercises. Demonstrated understanding.  OBJECTIVE IMPAIRMENTS: Abnormal gait, decreased balance, difficulty walking, decreased ROM, decreased strength, increased muscle spasms, improper body mechanics, and pain.   ACTIVITY LIMITATIONS: lifting, sitting, standing, squatting, and transfers  PARTICIPATION LIMITATIONS: yard work  PERSONAL FACTORS: 1 comorbidity: previous THA are also affecting patient's functional outcome.   REHAB POTENTIAL: Good  CLINICAL DECISION MAKING: Stable/uncomplicated  EVALUATION COMPLEXITY: Moderate   GOALS: Goals reviewed with patient? Yes  SHORT TERM GOALS: (target date for Short term goals are 3 weeks 12/06/2023)  1. Patient will demonstrate independent use of home exercise program to maintain progress from in clinic treatments.  Goal status: New  LONG TERM GOALS: 01/10/24     1. Patient will demonstrate/report pain at worst less than or equal to 2/10 to facilitate minimal limitation in daily activity secondary to pain symptoms.  Goal status: New   2. Patient will demonstrate independent use of home exercise program to facilitate ability to maintain/progress functional gains from skilled physical therapy services.  Goal status: New   3. Patient will demonstrate Patient specific functional scale by 2 points or more. indicate reduced disability due to condition.   Goal status: New   4. Patient will improve coordination of posture by 25 % through stabilization exercises.  Goal  status: New   5.  Reduce pain with ambulation to 3/10.  Goal status: New   PLAN:  PT FREQUENCY: 1-2x/week  PT DURATION: 8 weeks  PLANNED INTERVENTIONS: Can include 02853- PT Re-evaluation, 97110-Therapeutic exercises, 97530- Therapeutic activity, [id number]- Neuromuscular  re-education, 97535- Self Care, 97140- Manual therapy, [phone]- Gait training, [phone]- Orthotic Fit/training, [phone]- Canalith repositioning, [id number]- Aquatic Therapy, [phone]- Electrical stimulation (unattended), [id number] Physical performance testing, 97016- Vasopneumatic device, [id number]- Ultrasound, [id number]- Traction (mechanical), [id number]- Ionotophoresis 4mg /ml Dexamethasone ,  79439 - Needle insertion w/o injection 1 or 2 muscles, 20561 - Needle insertion w/o injection 3 or more muscles.    Patient/Family education, Balance training, Stair training, Taping, Dry Needling, Joint mobilization, Joint manipulation, Spinal manipulation, Spinal mobilization, Scar mobilization, Vestibular training, Visual/preceptual remediation/compensation, DME instructions, Cryotherapy, and Moist heat.  All performed as medically necessary.  All included unless contraindicated  PLAN FOR NEXT SESSION: Continue with stabilization exercises.   Burnard Meth, PT 11/17/23  12:34 PM

## 2023-11-16 NOTE — Telephone Encounter (Signed)
 Kristi from [employer] called. MRI was denied by insurance. They are saying that he needs 6 wks of PT before they will approve MRI. Her cb# [phone]

## 2023-11-16 NOTE — Patient Instructions (Signed)

## 2023-11-17 ENCOUNTER — Ambulatory Visit

## 2023-11-17 DIAGNOSIS — M6281 Muscle weakness (generalized): Secondary | ICD-10-CM | POA: Diagnosis not present

## 2023-11-17 DIAGNOSIS — M4126 Other idiopathic scoliosis, lumbar region: Secondary | ICD-10-CM

## 2023-11-17 DIAGNOSIS — M5459 Other low back pain: Secondary | ICD-10-CM

## 2023-11-18 ENCOUNTER — Other Ambulatory Visit

## 2023-11-22 ENCOUNTER — Ambulatory Visit: Payer: Self-pay | Admitting: Urology

## 2023-11-22 NOTE — Therapy (Signed)
 OUTPATIENT PHYSICAL THERAPY TREATMENT   Patient Name: Taylor Heath MRN: [id number] DOB:10-25-1960, 63 y.o., male Today's Date: 11/23/2023  END OF SESSION:  PT End of Session - 11/23/23 1106     Visit Number 3    Number of Visits 13    Date for Recertification  01/10/24    PT Start Time 1106    PT Stop Time 1146    PT Time Calculation (min) 40 min    Activity Tolerance Patient tolerated treatment well    Behavior During Therapy [facility] for tasks assessed/performed            Past Medical History:  Diagnosis Date   Anxiety    Arthritis    GERD (gastroesophageal reflux disease)    History of kidney stones    Pneumonia    Rheumatoid arthritis (HCC)    Past Surgical History:  Procedure Laterality Date   COLONOSCOPY  2015   FINGER SURGERY Left    index   HAND SURGERY Left 04/04/2023   HERNIA REPAIR  04/30/2022   I & D EXTREMITY Left 01/27/2022   Procedure: IRRIGATION AND DEBRIDEMENT LEFT HAND;  Surgeon: Jerri Kay HERO, MD;  [facility]: [facility];  Service: Orthopedics;  Laterality: Left;   I & D EXTREMITY Left 01/29/2022   Procedure: REPEAT IRRIGATION AND DEBRIDEMENT OF HAND;  Surgeon: Jerri Kay HERO, MD;  [facility]: [facility];  Service: Orthopedics;  Laterality: Left;   JOINT REPLACEMENT     left hip replaced   KNEE ARTHROSCOPY W/ ACL RECONSTRUCTION Right 2009   LEG SURGERY Left    left calf   MINOR APPLICATION OF WOUND VAC  01/29/2022   Procedure: APPLICATION OF WOUND VAC;  Surgeon: Jerri Kay HERO, MD;  [facility]: [facility];  Service: Orthopedics;;   ORIF FINGER / THUMB FRACTURE     TOTAL HIP ARTHROPLASTY Right 08/15/2023   Procedure: ARTHROPLASTY, HIP, TOTAL, ANTERIOR APPROACH;  Surgeon: Jerri Kay HERO, MD;  [facility]: [facility];  Service: Orthopedics;  Laterality: Right;  3-C   TOTAL KNEE ARTHROPLASTY Right 01/27/2017   Procedure: RIGHT TOTAL KNEE ARTHROPLASTY;  Surgeon: Jerri Kay HERO, MD;  [facility]: [facility];  Service: Orthopedics;  Laterality: Right;   Patient Active Problem List    Diagnosis Date Noted   Carpal tunnel syndrome, right upper limb 10/11/2023   Primary osteoarthritis of right hip 08/15/2023   Status post total replacement of right hip 08/15/2023   Umbilical hernia without obstruction and without gangrene 04/07/2022   Rectus diastasis 04/07/2022   Seropositive rheumatoid arthritis (HCC) 02/12/2022   High risk medication use 02/12/2022   Abscess of dorsum of left hand 01/26/2022   Extensor tenosynovitis of left wrist 12/01/2021   Primary osteoarthritis of right wrist 04/26/2017   Radiculopathy of cervical spine 04/26/2017   Total knee replacement status 01/27/2017   Primary osteoarthritis of right shoulder 12/30/2016   Primary osteoarthritis of right knee 12/30/2016   Insomnia 03/10/2012   BPH (benign prostatic hyperplasia) 12/09/2011   GERD (gastroesophageal reflux disease) 12/09/2011    PCP: Bertell Satterfield , MD  REFERRING PROVIDER: Jule Ronal CROME, PA-C  REFERRING DIAG: M54.50 (ICD-10-CM) - Low back pain, unspecified back pain laterality, unspecified chronicity, unspecified whether sciatica present  Rationale for Evaluation and Treatment: Rehabilitation  THERAPY DIAG:  Other idiopathic scoliosis, lumbar region  Other low back pain  Muscle weakness (generalized)  ONSET DATE: 2+ years with recent increase  SUBJECTIVE:  SUBJECTIVE STATEMENT:  Patient notes that back pain continues to hurt, though it is mostly in the hip. Patient also notes that he is concerned about making it through customs for his upcoming trip.     PERTINENT HISTORY:  Chronic back pain complaints, arthritis, anxiety, GERD, RA  Pt reports MRI was denied.  He has increased his visits for PT.    Currently c/o R lumbar/buttuck pain with walking .  He states prednisone  dose pack that he  started a week ago has helped decrease the pain.   PAIN:  NPRS scale: 2/10-8/10. Pain location: R SI jt area Pain description: sharp and dull Aggravating factors: transfers, walking Relieving factors: rest  PRECAUTIONS: None  WEIGHT BEARING RESTRICTIONS: No  FALLS:  Has patient fallen in last 6 months? No  LIVING ENVIRONMENT: Lives with: lives with their family Lives in: House/apartment Stairs: No Has following equipment at home: None  OCCUPATION: retired  PLOF: Independent  PATIENT GOALS: decrease pain and return to golf  Next MD Visit:    OBJECTIVE:   DIAGNOSTIC FINDINGS:  MRI pending  PATIENT SURVEYS:  Patient-Specific Activity Scoring Scheme  0 represents "unable to perform." 10 represents "able to perform at prior level. 0 1 2 3 4 5 6 7 8 9  10 (Date and Score)   Activity Eval  11/15/2023    1. walking 6     2. bending to pick up objects  5    3. Getting OOB 5   4.    5.    Score 5.33    Total score = sum of the activity scores/number of activities Minimum detectable change (90%CI) for average score = 2 points Minimum detectable change (90%CI) for single activity score = 3 points  SCREENING FOR RED FLAGS: 11/15/2023 Bowel or bladder incontinence: No  COGNITION: 11/15/2023 Overall cognitive status: WFL normal      SENSATION: 11/15/2023 [facility]  MUSCLE LENGTH: 11/15/2023 Hamstrings: Right  WNL Left WNL  POSTURE:  11/15/2023 weight shift right and genu L ,lumbar lordosis dec, R PSIS post, R shoulder lower  PALPATION: 11/15/2023 1+ tenderness at R SI joint   AROM Eval 11/15/2023  Flexion 100%  Extension   Right lateral flexion   Left lateral flexion 75%  Right rotation   Left rotation 75%   (Blank rows = not tested)  LOWER EXTREMITY ROM:     Active  Right Eval 11/15/2023 Left Eval 11/15/2023  Hip flexion [facility]  Hip extension    Hip abduction    Hip adduction    Hip internal rotation    Hip external rotation    Knee flexion     Knee extension    Ankle dorsiflexion    Ankle plantarflexion    Ankle inversion    Ankle eversion     (Blank rows = not tested)  LOWER EXTREMITY MMT:  4+  MMT Right Eval 11/15/2023 Left Eval 11/15/2023  Hip flexion 4 4  Hip extension 4 4  Hip abduction 4 4  Hip adduction    Hip internal rotation    Hip external rotation    Knee flexion    Knee extension    Ankle dorsiflexion    Ankle plantarflexion    Ankle inversion    Ankle eversion     (Blank rows = not tested)  LUMBAR SPECIAL TESTS:  11/15/2023 Straight leg raise test: Negative  FUNCTIONAL TESTS:  11/15/2023 none  GAIT: 11/15/2023  TODAY'S TREATMENT:                                                                                                          11/23/2023 TherEx:  Nustep with bilat LE level 5 for 8 minutes  Supine SLR with quad contraction and TA activation 2x10 with 3s hold on each side  Multiple verbal and tactile cues required for redirection and appropriate form   TherAct: Attempted hip hikes with mirror for visual feedback, but unable to complete due to high level of difficulty  Tap down squats to 22 table with 5# db 3x8; seated rest breaks required secondary to onset of nerve-like pain in Rt LE   Self-Care: PT discussed xray results, interaction between spine, nerves, and musculature, how to make changes with traveling to assist with endurance and pain, next steps with POC   DATE: 11/17/23 Nustep level 6 6 min  Tband hip abd 3x10  Access Code: [email or web address] URL: [email or web address] Date: 11/17/2023 Prepared by: Burnard Meth  Exercises - Supine Bridge  - 2 x daily - 7 x weekly - 2 sets - 10 reps - Supine 90/90 Alternating Toe Touch  - 2 x daily - 7 x weekly - 2 sets - 10  reps - Side Plank on Knees  - 2 x daily - 7 x weekly - 2 sets - 10 reps - Prone Alternating Arm and Leg Lifts  - 2 x daily - 7 x weekly - 2 sets - 10 reps - Swiss Ball March  - 2 x daily - 7 x weekly - 2 sets - 10 reps - Diagonal Forward Reaches  - 2 x daily - 7 x weekly - 2 sets - 10 reps - Prone Bilateral Arm and Leg Lift  - 2 x daily - 7 x weekly - 2 sets - 10 reps - Hip Hiking on Step  - 2 x daily - 7 x weekly - 2 sets - 10 reps - Hooklying Clamshell with Resistance  - 2 x daily - 7 x weekly - 2 sets - 10 reps  DATE: 11/15/2023  Initial evaluation completed.  Pt to return this week for HEP due to time constraints.  PATIENT EDUCATION:  11/15/2023 Education details: HEP, POC Person educated: Patient Education method: Programmer, multimedia, Demonstration, Verbal cues, and Handouts Education comprehension: verbalized understanding, returned demonstration, and verbal cues required  HOME EXERCISE PROGRAM: Pending  ASSESSMENT:  CLINICAL IMPRESSION: Patient arrived to session noting continued pain and difficulty, especially with Rt LE when having radiating pain. Patient seems to be compliant with HEP thus far, however, is going on vacation for 9 days. Patient tolerated most activities this date, though is unable to complete hip hikes. Patient will continue to benefit from skilled PT.  OBJECTIVE IMPAIRMENTS: Abnormal gait, decreased balance, difficulty walking, decreased ROM, decreased strength, increased muscle spasms, improper body mechanics, and pain.   ACTIVITY LIMITATIONS: lifting, sitting, standing, squatting, and transfers  PARTICIPATION LIMITATIONS: yard work  PERSONAL FACTORS: 1 comorbidity: previous THA are also affecting patient's functional outcome.   REHAB POTENTIAL: Good  CLINICAL DECISION MAKING: Stable/uncomplicated  EVALUATION COMPLEXITY: Moderate   GOALS: Goals reviewed with patient? Yes  SHORT TERM GOALS: (target date for Short term goals are 3 weeks 12/06/2023)  1.  Patient will demonstrate independent use of home exercise program to maintain progress from in clinic treatments.  Goal status: New  LONG TERM GOALS: 01/10/24     1. Patient will demonstrate/report pain at worst less than or equal to 2/10 to facilitate minimal limitation in daily activity secondary to pain symptoms.  Goal status: New   2. Patient will demonstrate independent use of home exercise program to facilitate ability to maintain/progress functional gains from skilled physical therapy services.  Goal status: New   3. Patient will demonstrate Patient specific functional scale by 2 points or more. indicate reduced disability due to condition.   Goal status: New   4. Patient will improve coordination of posture by 25 % through stabilization exercises.  Goal status: New   5.  Reduce pain with ambulation to 3/10.  Goal status: New   PLAN:  PT FREQUENCY: 1-2x/week  PT DURATION: 8 weeks  PLANNED INTERVENTIONS: Can include 02853- PT Re-evaluation, 97110-Therapeutic exercises, 97530- Therapeutic activity, [id number]- Neuromuscular re-education, 97535- Self Care, 97140- Manual therapy, [id number]- Gait training, [phone]- Orthotic Fit/training, [phone]- Canalith repositioning, [id number]- Aquatic Therapy, [phone]- Electrical stimulation (unattended), [id number] Physical performance testing, 97016- Vasopneumatic device, [id number]- Ultrasound, [id number]- Traction (mechanical), [id number]- Ionotophoresis 4mg /ml Dexamethasone ,  79439 - Needle insertion w/o injection 1 or 2 muscles, 20561 - Needle insertion w/o injection 3 or more muscles.    Patient/Family education, Balance training, Stair training, Taping, Dry Needling, Joint mobilization, Joint manipulation, Spinal manipulation, Spinal mobilization, Scar mobilization, Vestibular training, Visual/preceptual remediation/compensation, DME instructions, Cryotherapy, and Moist heat.  All performed as medically necessary.  All included unless contraindicated  PLAN FOR NEXT  SESSION: Continue with stabilization and strengthening exercises.  Susannah Daring, PT, DPT 11/23/23 12:55 PM

## 2023-11-23 ENCOUNTER — Ambulatory Visit

## 2023-11-23 DIAGNOSIS — M6281 Muscle weakness (generalized): Secondary | ICD-10-CM

## 2023-11-23 DIAGNOSIS — M4126 Other idiopathic scoliosis, lumbar region: Secondary | ICD-10-CM | POA: Diagnosis not present

## 2023-11-23 DIAGNOSIS — M5459 Other low back pain: Secondary | ICD-10-CM | POA: Diagnosis not present

## 2023-11-25 ENCOUNTER — Other Ambulatory Visit: Payer: Self-pay | Admitting: Internal Medicine

## 2023-11-25 DIAGNOSIS — M059 Rheumatoid arthritis with rheumatoid factor, unspecified: Secondary | ICD-10-CM

## 2023-11-25 NOTE — Telephone Encounter (Signed)
 Last Fill: 09/05/2023  Labs: 10/11/2023 Sedimentation rate of 6 remains normal. So this inflammation in the wrists seems to be a localized process. His blood count and kidney and liver function test are normal so no problem for continuing current methotrexate .   Next Visit: 01/11/2024  Last Visit: 10/11/2023  DX:  Seropositive rheumatoid arthritis (HCC)   Current Dose per office note 10/11/2023: methotrexate  15 mg p.o. weekly   Okay to refill Methotrexate ?

## 2023-12-05 NOTE — Therapy (Signed)
 OUTPATIENT PHYSICAL THERAPY TREATMENT   Patient Name: Taylor Heath MRN: [id number] DOB:06/18/1960, 63 y.o., male Today's Date: 12/06/2023  END OF SESSION:  PT End of Session - 12/06/23 1104     Visit Number 4    Number of Visits 13    Date for Recertification  01/10/24    Authorization - Visit Number 4    Authorization - Number of Visits 20    Progress Note Due on Visit 10    PT Start Time 1104    PT Stop Time 1143    PT Time Calculation (min) 39 min    Activity Tolerance Patient tolerated treatment well    Behavior During Therapy WFL for tasks assessed/performed             Past Medical History:  Diagnosis Date   Anxiety    Arthritis    GERD (gastroesophageal reflux disease)    History of kidney stones    Pneumonia    Rheumatoid arthritis (HCC)    Past Surgical History:  Procedure Laterality Date   COLONOSCOPY  2015   FINGER SURGERY Left    index   HAND SURGERY Left 04/04/2023   HERNIA REPAIR  04/30/2022   I & D EXTREMITY Left 01/27/2022   Procedure: IRRIGATION AND DEBRIDEMENT LEFT HAND;  Surgeon: Jerri Kay HERO, MD;  Location: [facility];  Service: Orthopedics;  Laterality: Left;   I & D EXTREMITY Left 01/29/2022   Procedure: REPEAT IRRIGATION AND DEBRIDEMENT OF HAND;  Surgeon: Jerri Kay HERO, MD;  Location: [facility];  Service: Orthopedics;  Laterality: Left;   JOINT REPLACEMENT     left hip replaced   KNEE ARTHROSCOPY W/ ACL RECONSTRUCTION Right 2009   LEG SURGERY Left    left calf   MINOR APPLICATION OF WOUND VAC  01/29/2022   Procedure: APPLICATION OF WOUND VAC;  Surgeon: Jerri Kay HERO, MD;  Location: [facility];  Service: Orthopedics;;   ORIF FINGER / THUMB FRACTURE     TOTAL HIP ARTHROPLASTY Right 08/15/2023   Procedure: ARTHROPLASTY, HIP, TOTAL, ANTERIOR APPROACH;  Surgeon: Jerri Kay HERO, MD;  Location: [facility];  Service: Orthopedics;  Laterality: Right;  3-C   TOTAL KNEE ARTHROPLASTY Right 01/27/2017   Procedure: RIGHT TOTAL KNEE ARTHROPLASTY;  Surgeon: Jerri Kay HERO, MD;  Location: [facility];  Service: Orthopedics;  Laterality: Right;   Patient Active Problem List   Diagnosis Date Noted   Carpal tunnel syndrome, right upper limb 10/11/2023   Primary osteoarthritis of right hip 08/15/2023   Status post total replacement of right hip 08/15/2023   Umbilical hernia without obstruction and without gangrene 04/07/2022   Rectus diastasis 04/07/2022   Seropositive rheumatoid arthritis (HCC) 02/12/2022   High risk medication use 02/12/2022   Abscess of dorsum of left hand 01/26/2022   Extensor tenosynovitis of left wrist 12/01/2021   Primary osteoarthritis of right wrist 04/26/2017   Radiculopathy of cervical spine 04/26/2017   Total knee replacement status 01/27/2017   Primary osteoarthritis of right shoulder 12/30/2016   Primary osteoarthritis of right knee 12/30/2016   Insomnia 03/10/2012   BPH (benign prostatic hyperplasia) 12/09/2011   GERD (gastroesophageal reflux disease) 12/09/2011    PCP: Bertell Satterfield , MD  REFERRING PROVIDER: Jule Ronal CROME, PA-C  REFERRING DIAG: M54.50 (ICD-10-CM) - Low back pain, unspecified back pain laterality, unspecified chronicity, unspecified whether sciatica present  Rationale for Evaluation and Treatment: Rehabilitation  THERAPY DIAG:  Other idiopathic scoliosis, lumbar region  Other low back pain  Muscle weakness (generalized)  ONSET DATE: 2+ years with recent increase  SUBJECTIVE:                                                                                                                                                                                           SUBJECTIVE STATEMENT:  Patient reports no change in symptoms.     PERTINENT HISTORY:  Chronic back pain complaints, arthritis, anxiety, GERD, RA  Pt reports MRI was denied.  He has increased his visits for PT.    Currently c/o R lumbar/buttuck pain with walking .  He states prednisone  dose pack that he started a week ago has  helped decrease the pain.   PAIN:  NPRS scale: 0/10 at rest, 10/10 with increased walking  Pain location: R SI jt area Pain description: sharp and dull Aggravating factors: transfers, walking Relieving factors: rest  PRECAUTIONS: None  WEIGHT BEARING RESTRICTIONS: No  FALLS:  Has patient fallen in last 6 months? No  LIVING ENVIRONMENT: Lives with: lives with their family Lives in: House/apartment Stairs: No Has following equipment at home: None  OCCUPATION: retired  PLOF: Independent  PATIENT GOALS: decrease pain and return to golf  Next MD Visit:    OBJECTIVE:   DIAGNOSTIC FINDINGS:  MRI pending  PATIENT SURVEYS:  Patient-Specific Activity Scoring Scheme  0 represents "unable to perform." 10 represents "able to perform at prior level. 0 1 2 3 4 5 6 7 8 9  10 (Date and Score)   Activity Eval  11/15/2023    1. walking 6     2. bending to pick up objects  5    3. Getting OOB 5   4.    5.    Score 5.33    Total score = sum of the activity scores/number of activities Minimum detectable change (90%CI) for average score = 2 points Minimum detectable change (90%CI) for single activity score = 3 points  SCREENING FOR RED FLAGS: 11/15/2023 Bowel or bladder incontinence: No  COGNITION: 11/15/2023 Overall cognitive status: WFL normal      SENSATION: 11/15/2023 [facility]  MUSCLE LENGTH: 11/15/2023 Hamstrings: Right  WNL Left WNL  POSTURE:  11/15/2023 weight shift right and genu L ,lumbar lordosis dec, R PSIS post, R shoulder lower  PALPATION: 11/15/2023 1+ tenderness at R SI joint   AROM Eval 11/15/2023  Flexion 100%  Extension   Right lateral flexion   Left lateral flexion 75%  Right rotation   Left rotation 75%   (Blank rows = not tested)  LOWER EXTREMITY ROM:     Active  Right Eval 11/15/2023 Left Eval 11/15/2023  Hip flexion  [facility] WFL  Hip extension    Hip abduction    Hip adduction    Hip internal rotation    Hip external rotation     Knee flexion    Knee extension    Ankle dorsiflexion    Ankle plantarflexion    Ankle inversion    Ankle eversion     (Blank rows = not tested)  LOWER EXTREMITY MMT:  4+  MMT Right Eval 11/15/2023 Left Eval 11/15/2023  Hip flexion 4 4  Hip extension 4 4  Hip abduction 4 4  Hip adduction    Hip internal rotation    Hip external rotation    Knee flexion    Knee extension    Ankle dorsiflexion    Ankle plantarflexion    Ankle inversion    Ankle eversion     (Blank rows = not tested)  LUMBAR SPECIAL TESTS:  11/15/2023 Straight leg raise test: Negative  FUNCTIONAL TESTS:  11/15/2023 none  GAIT: 11/15/2023                                                                                                                                                                                                                   TODAY'S TREATMENT:                                                                                                          12/06/2023 TherEx: UBE with bilat UE and LE level 4 for 8 minutes  Bilat LE leg press 1x15 with 75#, 2x15 with 100# Standing RDLs with 10# KB, 3x10 with verbal cues required especially for slight knee flexion  Standing hamstring stretch between sets  Paloff press with blue TB 2x15 each side  Seated figure 4 stretch 2x30s each LE   11/23/2023 TherEx:  Nustep with bilat LE level 5 for 8 minutes  Supine SLR with quad contraction and TA activation 2x10 with 3s hold on each side  Multiple verbal and tactile cues required for redirection and appropriate form   TherAct: Attempted hip hikes with mirror for  visual feedback, but unable to complete due to high level of difficulty  Tap down squats to 22 table with 5# db 3x8; seated rest breaks required secondary to onset of nerve-like pain in Rt LE   Self-Care: PT discussed xray results, interaction between spine, nerves, and musculature, how to make changes with traveling to assist with  endurance and pain, next steps with POC   DATE: 11/17/23 Nustep level 6 6 min  Tband hip abd 3x10  Access Code: [email or web address] URL: [email or web address] Date: 11/17/2023 Prepared by: Burnard Meth  Exercises - Supine Bridge  - 2 x daily - 7 x weekly - 2 sets - 10 reps - Supine 90/90 Alternating Toe Touch  - 2 x daily - 7 x weekly - 2 sets - 10 reps - Side Plank on Knees  - 2 x daily - 7 x weekly - 2 sets - 10 reps - Prone Alternating Arm and Leg Lifts  - 2 x daily - 7 x weekly - 2 sets - 10 reps - Swiss Ball March  - 2 x daily - 7 x weekly - 2 sets - 10 reps - Diagonal Forward Reaches  - 2 x daily - 7 x weekly - 2 sets - 10 reps - Prone Bilateral Arm and Leg Lift  - 2 x daily - 7 x weekly - 2 sets - 10 reps - Hip Hiking on Step  - 2 x daily - 7 x weekly - 2 sets - 10 reps - Hooklying Clamshell with Resistance  - 2 x daily - 7 x weekly - 2 sets - 10 reps  DATE: 11/15/2023  Initial evaluation completed.  Pt to return this week for HEP due to time constraints.  PATIENT EDUCATION:  11/15/2023 Education details: HEP, POC Person educated: Patient Education method: Programmer, multimedia, Demonstration, Verbal cues, and Handouts Education comprehension: verbalized understanding, returned demonstration, and verbal cues required  HOME EXERCISE PROGRAM: Pending  ASSESSMENT:  CLINICAL IMPRESSION: Patient arrived to session noting no change in symptoms, though has been able to increase ambulation distance. Patient tolerated all activities this date with intermittent required trunk flexion to relieve Rt SIJ pain. Patient will continue to benefit from skilled PT.   OBJECTIVE IMPAIRMENTS: Abnormal gait, decreased balance, difficulty walking, decreased ROM, decreased strength, increased muscle spasms, improper body mechanics, and pain.   ACTIVITY LIMITATIONS: lifting, sitting, standing, squatting, and transfers  PARTICIPATION LIMITATIONS: yard work  PERSONAL FACTORS: 1 comorbidity: previous  THA are also affecting patient's functional outcome.   REHAB POTENTIAL: Good  CLINICAL DECISION MAKING: Stable/uncomplicated  EVALUATION COMPLEXITY: Moderate   GOALS: Goals reviewed with patient? Yes  SHORT TERM GOALS: (target date for Short term goals are 3 weeks 12/06/2023)  1. Patient will demonstrate independent use of home exercise program to maintain progress from in clinic treatments.  Goal status: New  LONG TERM GOALS: 01/10/24     1. Patient will demonstrate/report pain at worst less than or equal to 2/10 to facilitate minimal limitation in daily activity secondary to pain symptoms.  Goal status: New   2. Patient will demonstrate independent use of home exercise program to facilitate ability to maintain/progress functional gains from skilled physical therapy services.  Goal status: New   3. Patient will demonstrate Patient specific functional scale by 2 points or more. indicate reduced disability due to condition.   Goal status: New   4. Patient will improve coordination of posture by 25 % through stabilization exercises.  Goal status: New   5.  Reduce pain  with ambulation to 3/10.  Goal status: New   PLAN:  PT FREQUENCY: 1-2x/week  PT DURATION: 8 weeks  PLANNED INTERVENTIONS: Can include 02853- PT Re-evaluation, 97110-Therapeutic exercises, 97530- Therapeutic activity, [id number]- Neuromuscular re-education, 97535- Self Care, 97140- Manual therapy, [phone]- Gait training, [phone]- Orthotic Fit/training, [phone]- Canalith repositioning, [id number]- Aquatic Therapy, [phone]- Electrical stimulation (unattended), [id number] Physical performance testing, 97016- Vasopneumatic device, [id number]- Ultrasound, [id number]- Traction (mechanical), [id number]- Ionotophoresis 4mg /ml Dexamethasone ,  79439 - Needle insertion w/o injection 1 or 2 muscles, 20561 - Needle insertion w/o injection 3 or more muscles.    Patient/Family education, Balance training, Stair training, Taping, Dry Needling, Joint  mobilization, Joint manipulation, Spinal manipulation, Spinal mobilization, Scar mobilization, Vestibular training, Visual/preceptual remediation/compensation, DME instructions, Cryotherapy, and Moist heat.  All performed as medically necessary.  All included unless contraindicated  PLAN FOR NEXT SESSION:  Continue with stabilization and strengthening exercises.  Susannah Daring, PT, DPT 12/06/23 11:46 AM

## 2023-12-06 ENCOUNTER — Ambulatory Visit

## 2023-12-06 DIAGNOSIS — M4126 Other idiopathic scoliosis, lumbar region: Secondary | ICD-10-CM

## 2023-12-06 DIAGNOSIS — M6281 Muscle weakness (generalized): Secondary | ICD-10-CM

## 2023-12-06 DIAGNOSIS — M5459 Other low back pain: Secondary | ICD-10-CM

## 2023-12-12 NOTE — Therapy (Signed)
 OUTPATIENT PHYSICAL THERAPY TREATMENT   Patient Name: Taylor Heath MRN: [id number] DOB:04-03-1960, 63 y.o., male Today's Date: 12/12/2023  END OF SESSION:       Past Medical History:  Diagnosis Date   Anxiety    Arthritis    GERD (gastroesophageal reflux disease)    History of kidney stones    Pneumonia    Rheumatoid arthritis (HCC)    Past Surgical History:  Procedure Laterality Date   COLONOSCOPY  2015   FINGER SURGERY Left    index   HAND SURGERY Left 04/04/2023   HERNIA REPAIR  04/30/2022   I & D EXTREMITY Left 01/27/2022   Procedure: IRRIGATION AND DEBRIDEMENT LEFT HAND;  Surgeon: Jerri Kay HERO, MD;  Location: [facility];  Service: Orthopedics;  Laterality: Left;   I & D EXTREMITY Left 01/29/2022   Procedure: REPEAT IRRIGATION AND DEBRIDEMENT OF HAND;  Surgeon: Jerri Kay HERO, MD;  Location: [facility];  Service: Orthopedics;  Laterality: Left;   JOINT REPLACEMENT     left hip replaced   KNEE ARTHROSCOPY W/ ACL RECONSTRUCTION Right 2009   LEG SURGERY Left    left calf   MINOR APPLICATION OF WOUND VAC  01/29/2022   Procedure: APPLICATION OF WOUND VAC;  Surgeon: Jerri Kay HERO, MD;  Location: [facility];  Service: Orthopedics;;   ORIF FINGER / THUMB FRACTURE     TOTAL HIP ARTHROPLASTY Right 08/15/2023   Procedure: ARTHROPLASTY, HIP, TOTAL, ANTERIOR APPROACH;  Surgeon: Jerri Kay HERO, MD;  Location: [facility];  Service: Orthopedics;  Laterality: Right;  3-C   TOTAL KNEE ARTHROPLASTY Right 01/27/2017   Procedure: RIGHT TOTAL KNEE ARTHROPLASTY;  Surgeon: Jerri Kay HERO, MD;  Location: [facility];  Service: Orthopedics;  Laterality: Right;   Patient Active Problem List   Diagnosis Date Noted   Carpal tunnel syndrome, right upper limb 10/11/2023   Primary osteoarthritis of right hip 08/15/2023   Status post total replacement of right hip 08/15/2023   Umbilical hernia without obstruction and without gangrene 04/07/2022   Rectus diastasis 04/07/2022   Seropositive rheumatoid arthritis (HCC)  02/12/2022   High risk medication use 02/12/2022   Abscess of dorsum of left hand 01/26/2022   Extensor tenosynovitis of left wrist 12/01/2021   Primary osteoarthritis of right wrist 04/26/2017   Radiculopathy of cervical spine 04/26/2017   Total knee replacement status 01/27/2017   Primary osteoarthritis of right shoulder 12/30/2016   Primary osteoarthritis of right knee 12/30/2016   Insomnia 03/10/2012   BPH (benign prostatic hyperplasia) 12/09/2011   GERD (gastroesophageal reflux disease) 12/09/2011    PCP: Bertell Satterfield , MD  REFERRING PROVIDER: Jule Ronal CROME, PA-C  REFERRING DIAG: M54.50 (ICD-10-CM) - Low back pain, unspecified back pain laterality, unspecified chronicity, unspecified whether sciatica present  Rationale for Evaluation and Treatment: Rehabilitation  THERAPY DIAG:  No diagnosis found.  ONSET DATE: 2+ years with recent increase  SUBJECTIVE:  SUBJECTIVE STATEMENT:  *** Patient reports no change in symptoms.     PERTINENT HISTORY:  Chronic back pain complaints, arthritis, anxiety, GERD, RA  Pt reports MRI was denied.  He has increased his visits for PT.    Currently c/o R lumbar/buttuck pain with walking .  He states prednisone  dose pack that he started a week ago has helped decrease the pain.   PAIN:  NPRS scale: 0/10 at rest, 10/10 with increased walking  Pain location: R SI jt area Pain description: sharp and dull Aggravating factors: transfers, walking Relieving factors: rest  PRECAUTIONS: None  WEIGHT BEARING RESTRICTIONS: No  FALLS:  Has patient fallen in last 6 months? No  LIVING ENVIRONMENT: Lives with: lives with their family Lives in: House/apartment Stairs: No Has following equipment at home: None  OCCUPATION: retired  PLOF:  Independent  PATIENT GOALS: decrease pain and return to golf  Next MD Visit:    OBJECTIVE:   DIAGNOSTIC FINDINGS:  MRI pending  PATIENT SURVEYS:  Patient-Specific Activity Scoring Scheme  0 represents "unable to perform." 10 represents "able to perform at prior level. 0 1 2 3 4 5 6 7 8 9  10 (Date and Score)   Activity Eval  11/15/2023    1. walking 6     2. bending to pick up objects  5    3. Getting OOB 5   4.    5.    Score 5.33    Total score = sum of the activity scores/number of activities Minimum detectable change (90%CI) for average score = 2 points Minimum detectable change (90%CI) for single activity score = 3 points  SCREENING FOR RED FLAGS: 11/15/2023 Bowel or bladder incontinence: No  COGNITION: 11/15/2023 Overall cognitive status: WFL normal      SENSATION: 11/15/2023 [facility]  MUSCLE LENGTH: 11/15/2023 Hamstrings: Right  WNL Left WNL  POSTURE:  11/15/2023 weight shift right and genu L ,lumbar lordosis dec, R PSIS post, R shoulder lower  PALPATION: 11/15/2023 1+ tenderness at R SI joint   AROM Eval 11/15/2023  Flexion 100%  Extension   Right lateral flexion   Left lateral flexion 75%  Right rotation   Left rotation 75%   (Blank rows = not tested)  LOWER EXTREMITY ROM:     Active  Right Eval 11/15/2023 Left Eval 11/15/2023  Hip flexion [facility]  Hip extension    Hip abduction    Hip adduction    Hip internal rotation    Hip external rotation    Knee flexion    Knee extension    Ankle dorsiflexion    Ankle plantarflexion    Ankle inversion    Ankle eversion     (Blank rows = not tested)  LOWER EXTREMITY MMT:  4+  MMT Right Eval 11/15/2023 Left Eval 11/15/2023  Hip flexion 4 4  Hip extension 4 4  Hip abduction 4 4  Hip adduction    Hip internal rotation    Hip external rotation    Knee flexion    Knee extension    Ankle dorsiflexion    Ankle plantarflexion    Ankle inversion    Ankle eversion     (Blank rows = not  tested)  LUMBAR SPECIAL TESTS:  11/15/2023 Straight leg raise test: Negative  FUNCTIONAL TESTS:  11/15/2023 none  GAIT: 11/15/2023  TODAY'S TREATMENT:                                                                                                          12/13/2023 ***   12/06/2023 TherEx: UBE with bilat UE and LE level 4 for 8 minutes  Bilat LE leg press 1x15 with 75#, 2x15 with 100# Standing RDLs with 10# KB, 3x10 with verbal cues required especially for slight knee flexion  Standing hamstring stretch between sets  Paloff press with blue TB 2x15 each side  Seated figure 4 stretch 2x30s each LE   11/23/2023 TherEx:  Nustep with bilat LE level 5 for 8 minutes  Supine SLR with quad contraction and TA activation 2x10 with 3s hold on each side  Multiple verbal and tactile cues required for redirection and appropriate form   TherAct: Attempted hip hikes with mirror for visual feedback, but unable to complete due to high level of difficulty  Tap down squats to 22 table with 5# db 3x8; seated rest breaks required secondary to onset of nerve-like pain in Rt LE   Self-Care: PT discussed xray results, interaction between spine, nerves, and musculature, how to make changes with traveling to assist with endurance and pain, next steps with POC   DATE: 11/17/23 Nustep level 6 6 min  Tband hip abd 3x10  Access Code: [email or web address] URL: [email or web address] Date: 11/17/2023 Prepared by: Burnard Meth  Exercises - Supine Bridge  - 2 x daily - 7 x weekly - 2 sets - 10 reps - Supine 90/90 Alternating Toe Touch  - 2 x daily - 7 x weekly - 2 sets - 10 reps - Side Plank on Knees  - 2 x daily - 7 x weekly - 2 sets - 10 reps - Prone Alternating Arm and Leg Lifts  - 2 x daily  - 7 x weekly - 2 sets - 10 reps - Swiss Ball March  - 2 x daily - 7 x weekly - 2 sets - 10 reps - Diagonal Forward Reaches  - 2 x daily - 7 x weekly - 2 sets - 10 reps - Prone Bilateral Arm and Leg Lift  - 2 x daily - 7 x weekly - 2 sets - 10 reps - Hip Hiking on Step  - 2 x daily - 7 x weekly - 2 sets - 10 reps - Hooklying Clamshell with Resistance  - 2 x daily - 7 x weekly - 2 sets - 10 reps  DATE: 11/15/2023  Initial evaluation completed.  Pt to return this week for HEP due to time constraints.  PATIENT EDUCATION:  11/15/2023 Education details: HEP, POC Person educated: Patient Education method: Programmer, multimedia, Demonstration, Verbal cues, and Handouts Education comprehension: verbalized understanding, returned demonstration, and verbal cues required  HOME EXERCISE PROGRAM: Pending  ASSESSMENT:  CLINICAL IMPRESSION: *** Patient arrived to session noting no change in symptoms, though has been able to increase ambulation distance. Patient tolerated all activities this date with intermittent required trunk flexion to relieve Rt SIJ pain. Patient will continue to benefit from skilled PT.  OBJECTIVE IMPAIRMENTS: Abnormal gait, decreased balance, difficulty walking, decreased ROM, decreased strength, increased muscle spasms, improper body mechanics, and pain.   ACTIVITY LIMITATIONS: lifting, sitting, standing, squatting, and transfers  PARTICIPATION LIMITATIONS: yard work  PERSONAL FACTORS: 1 comorbidity: previous THA are also affecting patient's functional outcome.   REHAB POTENTIAL: Good  CLINICAL DECISION MAKING: Stable/uncomplicated  EVALUATION COMPLEXITY: Moderate   GOALS: Goals reviewed with patient? Yes  SHORT TERM GOALS: (target date for Short term goals are 3 weeks 12/06/2023)  1. Patient will demonstrate independent use of home exercise program to maintain progress from in clinic treatments.  Goal status: New  LONG TERM GOALS: 01/10/24     1. Patient will  demonstrate/report pain at worst less than or equal to 2/10 to facilitate minimal limitation in daily activity secondary to pain symptoms.  Goal status: New   2. Patient will demonstrate independent use of home exercise program to facilitate ability to maintain/progress functional gains from skilled physical therapy services.  Goal status: New   3. Patient will demonstrate Patient specific functional scale by 2 points or more. indicate reduced disability due to condition.   Goal status: New   4. Patient will improve coordination of posture by 25 % through stabilization exercises.  Goal status: New   5.  Reduce pain with ambulation to 3/10.  Goal status: New   PLAN:  PT FREQUENCY: 1-2x/week  PT DURATION: 8 weeks  PLANNED INTERVENTIONS: Can include 02853- PT Re-evaluation, 97110-Therapeutic exercises, 97530- Therapeutic activity, [id number]- Neuromuscular re-education, 97535- Self Care, 97140- Manual therapy, [phone]- Gait training, [phone]- Orthotic Fit/training, [phone]- Canalith repositioning, [id number]- Aquatic Therapy, [phone]- Electrical stimulation (unattended), [id number] Physical performance testing, 97016- Vasopneumatic device, [id number]- Ultrasound, [id number]- Traction (mechanical), [id number]- Ionotophoresis 4mg /ml Dexamethasone ,  79439 - Needle insertion w/o injection 1 or 2 muscles, 20561 - Needle insertion w/o injection 3 or more muscles.    Patient/Family education, Balance training, Stair training, Taping, Dry Needling, Joint mobilization, Joint manipulation, Spinal manipulation, Spinal mobilization, Scar mobilization, Vestibular training, Visual/preceptual remediation/compensation, DME instructions, Cryotherapy, and Moist heat.  All performed as medically necessary.  All included unless contraindicated  PLAN FOR NEXT SESSION:  *** Continue with stabilization and strengthening exercises.  Susannah Daring, PT, DPT 12/12/23 7:51 AM

## 2023-12-13 ENCOUNTER — Ambulatory Visit

## 2023-12-13 DIAGNOSIS — M4126 Other idiopathic scoliosis, lumbar region: Secondary | ICD-10-CM | POA: Diagnosis not present

## 2023-12-13 DIAGNOSIS — M6281 Muscle weakness (generalized): Secondary | ICD-10-CM | POA: Diagnosis not present

## 2023-12-13 DIAGNOSIS — M5459 Other low back pain: Secondary | ICD-10-CM | POA: Diagnosis not present

## 2023-12-19 NOTE — Therapy (Addendum)
 " OUTPATIENT PHYSICAL THERAPY TREATMENT     Patient Name: Taylor Heath MRN: [id number] DOB:[birth year], 63 y.o., male Today's Date: 12/20/2023    END OF SESSION:  PT End of Session - 12/20/23 1200     Visit Number 6    Number of Visits 13    Date for Recertification  01/10/24    Authorization - Number of Visits 20    PT Start Time 1100    PT Stop Time 1155    PT Time Calculation (min) 55 min    Activity Tolerance Patient tolerated treatment well    Behavior During Therapy WFL for tasks assessed/performed               Past Medical History:  Diagnosis Date   Anxiety    Arthritis    GERD (gastroesophageal reflux disease)    History of kidney stones    Pneumonia    Rheumatoid arthritis (HCC)    Past Surgical History:  Procedure Laterality Date   COLONOSCOPY  2015   FINGER SURGERY Left    index   HAND SURGERY Left 04/04/2023   HERNIA REPAIR  04/30/2022   I & D EXTREMITY Left 01/27/2022   Procedure: IRRIGATION AND DEBRIDEMENT LEFT HAND;  Surgeon: Jerri Kay HERO, MD;  Location: [facility] OR;  Service: Orthopedics;  Laterality: Left;   I & D EXTREMITY Left 01/29/2022   Procedure: REPEAT IRRIGATION AND DEBRIDEMENT OF HAND;  Surgeon: Jerri Kay HERO, MD;  Location: [facility] OR;  Service: Orthopedics;  Laterality: Left;   JOINT REPLACEMENT     left hip replaced   KNEE ARTHROSCOPY W/ ACL RECONSTRUCTION Right 2009   LEG SURGERY Left    left calf   MINOR APPLICATION OF WOUND VAC  01/29/2022   Procedure: APPLICATION OF WOUND VAC;  Surgeon: Jerri Kay HERO, MD;  Location: [facility] OR;  Service: Orthopedics;;   ORIF FINGER / THUMB FRACTURE     TOTAL HIP ARTHROPLASTY Right 08/15/2023   Procedure: ARTHROPLASTY, HIP, TOTAL, ANTERIOR APPROACH;  Surgeon: Jerri Kay HERO, MD;  Location: [facility];  Service: Orthopedics;  Laterality: Right;  3-C   TOTAL KNEE ARTHROPLASTY Right 01/27/2017   Procedure: RIGHT TOTAL KNEE ARTHROPLASTY;  Surgeon: Jerri Kay HERO, MD;  Location: [facility];  Service: Orthopedics;   Laterality: Right;   Patient Active Problem List   Diagnosis Date Noted   Carpal tunnel syndrome, right upper limb 10/11/2023   Primary osteoarthritis of right hip 08/15/2023   Status post total replacement of right hip 08/15/2023   Umbilical hernia without obstruction and without gangrene 04/07/2022   Rectus diastasis 04/07/2022   Seropositive rheumatoid arthritis (HCC) 02/12/2022   High risk medication use 02/12/2022   Abscess of dorsum of left hand 01/26/2022   Extensor tenosynovitis of left wrist 12/01/2021   Primary osteoarthritis of right wrist 04/26/2017   Radiculopathy of cervical spine 04/26/2017   Total knee replacement status 01/27/2017   Primary osteoarthritis of right shoulder 12/30/2016   Primary osteoarthritis of right knee 12/30/2016   Insomnia 03/10/2012   BPH (benign prostatic hyperplasia) 12/09/2011   GERD (gastroesophageal reflux disease) 12/09/2011    PCP: Bertell Satterfield , MD  REFERRING PROVIDER: Jule Ronal CROME, PA-C  REFERRING DIAG: M54.50 (ICD-10-CM) - Low back pain, unspecified back pain laterality, unspecified chronicity, unspecified whether sciatica present  Rationale for Evaluation and Treatment: Rehabilitation  THERAPY DIAG:  Other idiopathic scoliosis, lumbar region  Other low back pain  Muscle weakness (generalized)  Localized edema  Other lack of  coordination  ONSET DATE: 2+ years with recent increase  SUBJECTIVE:                                                                                                                                                                                           SUBJECTIVE STATEMENT:  Patient reported PT has helped with mobility and flexibility.  Walking is better now. Still can get 10/10 pain at the R lumbar area that radiates down.  PERTINENT HISTORY:  Chronic back pain complaints, arthritis, anxiety, GERD, RA  Pt reports MRI was denied.  He has increased his visits for PT.    Currently c/o R  lumbar/buttuck pain with walking .  He states prednisone  dose pack that he started a week ago has helped decrease the pain.   PAIN:  NPRS scale: 0/10 with walking when given something to decrease pressure, 10/10 at max   Pain location: R SI jt area Pain description: sharp and dull Aggravating factors: transfers, walking Relieving factors: rest  PRECAUTIONS: None  WEIGHT BEARING RESTRICTIONS: No  FALLS:  Has patient fallen in last 6 months? No  LIVING ENVIRONMENT: Lives with: lives with their family Lives in: House/apartment Stairs: No Has following equipment at home: None  OCCUPATION: retired  PLOF: Independent  PATIENT GOALS: decrease pain and return to golf  Next MD Visit:    OBJECTIVE:   DIAGNOSTIC FINDINGS:  MRI pending  PATIENT SURVEYS:  Patient-Specific Activity Scoring Scheme  0 represents unable to perform. 10 represents able to perform at prior level. 0 1 2 3 4 5 6 7 8 9  10 (Date and Score)   Activity Eval  11/15/2023  12/13/2023  1. walking 6   6.5  2. bending to pick up objects  5 7   3. Getting OOB 5 7  4.    5.    Score 5.33 6.83   Total score = sum of the activity scores/number of activities Minimum detectable change (90%CI) for average score = 2 points Minimum detectable change (90%CI) for single activity score = 3 points  SCREENING FOR RED FLAGS: 11/15/2023 Bowel or bladder incontinence: No  COGNITION: 11/15/2023 Overall cognitive status: WFL normal      SENSATION: 11/15/2023 [facility]  MUSCLE LENGTH: 11/15/2023 Hamstrings: Right  WNL Left WNL  POSTURE:  11/15/2023 weight shift right and genu L ,lumbar lordosis dec, R PSIS post, R shoulder lower  PALPATION: 11/15/2023 1+ tenderness at R SI joint   AROM Eval 11/15/2023  Flexion 100%  Extension   Right lateral flexion   Left lateral flexion 75%  Right rotation   Left rotation 75%   (Blank rows =  not tested)  LOWER EXTREMITY ROM:     Active  Right Eval 11/15/2023  Left Eval 11/15/2023  Hip flexion [facility]  Hip extension    Hip abduction    Hip adduction    Hip internal rotation    Hip external rotation    Knee flexion    Knee extension    Ankle dorsiflexion    Ankle plantarflexion    Ankle inversion    Ankle eversion     (Blank rows = not tested)  LOWER EXTREMITY MMT:  4+  MMT Right Eval 11/15/2023 Left Eval 11/15/2023  Hip flexion 4 4  Hip extension 4 4  Hip abduction 4 4  Hip adduction    Hip internal rotation    Hip external rotation    Knee flexion    Knee extension    Ankle dorsiflexion    Ankle plantarflexion    Ankle inversion    Ankle eversion     (Blank rows = not tested)  LUMBAR SPECIAL TESTS:  11/15/2023 Straight leg raise test: Negative  FUNCTIONAL TESTS:  11/15/2023 none  GAIT: 11/15/2023                                                                                                                                                                                                                   TODAY'S TREATMENT:                                                                                                          12/20/23 TherEx: Recumbent bike level 5 for 9 minutes  Bilat leg press 3x15 with 112# Supine SLR with focus on TA and quad contraction, 2# 3x10 Seated rollouts with green theraball , fwd 2x30s, diagonal both direction 2x30s each  There Act RDLs with 12# KB,  3x10 Tap down squats to 22 table with 5# db 3x8;  Neuro Re ed for posture/balance T band Rows/Extension  blue 3x10 Supine hip abduction with resistance Tband G 3x15   12/13/2023 TherEx: Recumbent bike level 7 for 8 minutes  Bilat  leg press 3x15 with 106# RDLs with 12# KB,  3x12 Supine SLR with focus on TA and quad contraction, 2x12 each LE  Seated rollouts with green theraball , fwd 2x30s, diagonal both direction 2x30s each     12/06/2023 TherEx: UBE with bilat UE and LE level 4 for 8 minutes  Bilat LE leg press 1x15 with  75#, 2x15 with 100# Standing RDLs with 10# KB, 3x10 with verbal cues required especially for slight knee flexion  Standing hamstring stretch between sets  Paloff press with blue TB 2x15 each side  Seated figure 4 stretch 2x30s each LE   11/23/2023 TherEx:  Nustep with bilat LE level 5 for 8 minutes  Supine SLR with quad contraction and TA activation 2x10 with 3s hold on each side  Multiple verbal and tactile cues required for redirection and appropriate form   TherAct: Attempted hip hikes with mirror for visual feedback, but unable to complete due to high level of difficulty  Tap down squats to 22 table with 5# db 3x8; seated rest breaks required secondary to onset of nerve-like pain in Rt LE   Self-Care: PT discussed xray results, interaction between spine, nerves, and musculature, how to make changes with traveling to assist with endurance and pain, next steps with POC   DATE: 11/17/23 Nustep level 6 6 min  Tband hip abd 3x10  Access Code: [email or web address] URL: [email or web address] Date: 11/17/2023 Prepared by: Burnard Meth  Exercises - Supine Bridge  - 2 x daily - 7 x weekly - 2 sets - 10 reps - Supine 90/90 Alternating Toe Touch  - 2 x daily - 7 x weekly - 2 sets - 10 reps - Side Plank on Knees  - 2 x daily - 7 x weekly - 2 sets - 10 reps - Prone Alternating Arm and Leg Lifts  - 2 x daily - 7 x weekly - 2 sets - 10 reps - Swiss Ball March  - 2 x daily - 7 x weekly - 2 sets - 10 reps - Diagonal Forward Reaches  - 2 x daily - 7 x weekly - 2 sets - 10 reps - Prone Bilateral Arm and Leg Lift  - 2 x daily - 7 x weekly - 2 sets - 10 reps - Hip Hiking on Step  - 2 x daily - 7 x weekly - 2 sets - 10 reps - Hooklying Clamshell with Resistance  - 2 x daily - 7 x weekly - 2 sets - 10 reps  DATE: 11/15/2023  Initial evaluation completed.  Pt to return this week for HEP due to time constraints.  PATIENT EDUCATION:  11/15/2023 Education details: HEP, POC Person educated:  Patient Education method: Programmer, Multimedia, Demonstration, Verbal cues, and Handouts Education comprehension: verbalized understanding, returned demonstration, and verbal cues required  HOME EXERCISE PROGRAM: Pending  ASSESSMENT:  CLINICAL IMPRESSION: Patient needed cuing to slow down for improved posture.  Pain occasionally with exercise at R lumbar area with radiating pain to R LE.  Pain decreased with rest.   OBJECTIVE IMPAIRMENTS: Abnormal gait, decreased balance, difficulty walking, decreased ROM, decreased strength, increased muscle spasms, improper body mechanics, and pain.   ACTIVITY LIMITATIONS: lifting, sitting, standing, squatting, and transfers  PARTICIPATION LIMITATIONS: yard work  PERSONAL FACTORS: 1 comorbidity: previous THA are also affecting patient's functional outcome.   REHAB POTENTIAL: Good  CLINICAL DECISION MAKING: Stable/uncomplicated  EVALUATION COMPLEXITY: Moderate   GOALS: Goals reviewed with patient? Yes  SHORT TERM GOALS: (target date for Short term goals  are 3 weeks 12/06/2023)  1. Patient will demonstrate independent use of home exercise program to maintain progress from in clinic treatments.  Goal status: GOAL MET, 12/13/2023  LONG TERM GOALS: 01/10/24     1. Patient will demonstrate/report pain at worst less than or equal to 2/10 to facilitate minimal limitation in daily activity secondary to pain symptoms.  Goal status: Ongoing 12/20/23   2. Patient will demonstrate independent use of home exercise program to facilitate ability to maintain/progress functional gains from skilled physical therapy services.  Goal status:Ongoing 12/20/23   3. Patient will demonstrate Patient specific functional scale by 2 points or more. indicate reduced disability due to condition.   Goal status: Ongoing 12/20/23   4. Patient will improve coordination of posture by 25 % through stabilization exercises.  Goal status: Ongoing 12/20/23   5.  Reduce pain  with ambulation to 3/10.  Goal status: Ongoing 12/20/23   PLAN:  PT FREQUENCY: 1-2x/week  PT DURATION: 8 weeks  PLANNED INTERVENTIONS: Can include 02853- PT Re-evaluation, 97110-Therapeutic exercises, 97530- Therapeutic activity, 97112- Neuromuscular re-education, 97535- Self Care, 97140- Manual therapy, [phone]- Gait training, [phone]- Orthotic Fit/training, [phone]- Canalith repositioning, [id number]- Aquatic Therapy, [phone]- Electrical stimulation (unattended), [id number] Physical performance testing, [id number]- Vasopneumatic device, [id number]- Ultrasound, [id number]- Traction (mechanical), [id number]- Ionotophoresis 4mg /ml Dexamethasone ,  [id number] - Needle insertion w/o injection 1 or 2 muscles, [id number] - Needle insertion w/o injection 3 or more muscles.    Patient/Family education, Balance training, Stair training, Taping, Dry Needling, Joint mobilization, Joint manipulation, Spinal manipulation, Spinal mobilization, Scar mobilization, Vestibular training, Visual/preceptual remediation/compensation, DME instructions, Cryotherapy, and Moist heat.  All performed as medically necessary.  All included unless contraindicated  PLAN FOR NEXT SESSION:   Pt to make appt or call to see if they can try for  an MRI again.  Plan is to continue with PT while he waits for next consult/MRI.  PHYSICAL THERAPY DISCHARGE SUMMARY  Visits from Start of Care: 6  Current functional level related to goals / functional outcomes: See above   Remaining deficits: See above   Education / Equipment: HEP   Patient agrees to discharge. Patient goals were partially met. Patient is being discharged due to not returning since the last visit.    Burnard Meth, PT 12/20/23  12:05 PM     "

## 2023-12-20 ENCOUNTER — Other Ambulatory Visit: Payer: Self-pay | Admitting: Physical Medicine and Rehabilitation

## 2023-12-20 ENCOUNTER — Ambulatory Visit

## 2023-12-20 ENCOUNTER — Telehealth: Payer: Self-pay | Admitting: Physical Medicine and Rehabilitation

## 2023-12-20 DIAGNOSIS — M5459 Other low back pain: Secondary | ICD-10-CM

## 2023-12-20 DIAGNOSIS — M6281 Muscle weakness (generalized): Secondary | ICD-10-CM | POA: Diagnosis not present

## 2023-12-20 DIAGNOSIS — R278 Other lack of coordination: Secondary | ICD-10-CM

## 2023-12-20 DIAGNOSIS — M4126 Other idiopathic scoliosis, lumbar region: Secondary | ICD-10-CM

## 2023-12-20 DIAGNOSIS — R600 Localized edema: Secondary | ICD-10-CM

## 2023-12-20 DIAGNOSIS — M5416 Radiculopathy, lumbar region: Secondary | ICD-10-CM

## 2023-12-20 NOTE — Telephone Encounter (Signed)
 Patient was here. Would like a new referral for MRI to be done. He has completed his 6 PT visits.

## 2023-12-20 NOTE — Progress Notes (Signed)
 Patient has completed 6 weeks of formal physical therapy. I am placing new order for lumbar MRI imaging.

## 2023-12-27 NOTE — Therapy (Incomplete)
 OUTPATIENT PHYSICAL THERAPY TREATMENT     Patient Name: Taylor Heath MRN: [id number] DOB:1960-03-23, 63 y.o., male Today's Date: 12/27/2023    END OF SESSION:         Past Medical History:  Diagnosis Date   Anxiety    Arthritis    GERD (gastroesophageal reflux disease)    History of kidney stones    Pneumonia    Rheumatoid arthritis (HCC)    Past Surgical History:  Procedure Laterality Date   COLONOSCOPY  2015   FINGER SURGERY Left    index   HAND SURGERY Left 04/04/2023   HERNIA REPAIR  04/30/2022   I & D EXTREMITY Left 01/27/2022   Procedure: IRRIGATION AND DEBRIDEMENT LEFT HAND;  Surgeon: Jerri Kay HERO, MD;  Location: [facility] OR;  Service: Orthopedics;  Laterality: Left;   I & D EXTREMITY Left 01/29/2022   Procedure: REPEAT IRRIGATION AND DEBRIDEMENT OF HAND;  Surgeon: Jerri Kay HERO, MD;  Location: [facility] OR;  Service: Orthopedics;  Laterality: Left;   JOINT REPLACEMENT     left hip replaced   KNEE ARTHROSCOPY W/ ACL RECONSTRUCTION Right 2009   LEG SURGERY Left    left calf   MINOR APPLICATION OF WOUND VAC  01/29/2022   Procedure: APPLICATION OF WOUND VAC;  Surgeon: Jerri Kay HERO, MD;  Location: [facility] OR;  Service: Orthopedics;;   ORIF FINGER / THUMB FRACTURE     TOTAL HIP ARTHROPLASTY Right 08/15/2023   Procedure: ARTHROPLASTY, HIP, TOTAL, ANTERIOR APPROACH;  Surgeon: Jerri Kay HERO, MD;  Location: [facility] OR;  Service: Orthopedics;  Laterality: Right;  3-C   TOTAL KNEE ARTHROPLASTY Right 01/27/2017   Procedure: RIGHT TOTAL KNEE ARTHROPLASTY;  Surgeon: Jerri Kay HERO, MD;  Location: [facility];  Service: Orthopedics;  Laterality: Right;   Patient Active Problem List   Diagnosis Date Noted   Carpal tunnel syndrome, right upper limb 10/11/2023   Primary osteoarthritis of right hip 08/15/2023   Status post total replacement of right hip 08/15/2023   Umbilical hernia without obstruction and without gangrene 04/07/2022   Rectus diastasis 04/07/2022   Seropositive rheumatoid  arthritis (HCC) 02/12/2022   High risk medication use 02/12/2022   Abscess of dorsum of left hand 01/26/2022   Extensor tenosynovitis of left wrist 12/01/2021   Primary osteoarthritis of right wrist 04/26/2017   Radiculopathy of cervical spine 04/26/2017   Total knee replacement status 01/27/2017   Primary osteoarthritis of right shoulder 12/30/2016   Primary osteoarthritis of right knee 12/30/2016   Insomnia 03/10/2012   BPH (benign prostatic hyperplasia) 12/09/2011   GERD (gastroesophageal reflux disease) 12/09/2011    PCP: Bertell Satterfield , MD  REFERRING PROVIDER: Jule Ronal CROME, PA-C  REFERRING DIAG: M54.50 (ICD-10-CM) - Low back pain, unspecified back pain laterality, unspecified chronicity, unspecified whether sciatica present  Rationale for Evaluation and Treatment: Rehabilitation  THERAPY DIAG:  No diagnosis found.  ONSET DATE: 2+ years with recent increase  SUBJECTIVE:  SUBJECTIVE STATEMENT:  ***Patient reported PT has helped with mobility and flexibility.  Walking is better now. Still can get 10/10 pain at the R lumbar area that radiates down.  PERTINENT HISTORY:  Chronic back pain complaints, arthritis, anxiety, GERD, RA  Pt reports MRI was denied.  He has increased his visits for PT.    Currently c/o R lumbar/buttuck pain with walking .  He states prednisone  dose pack that he started a week ago has helped decrease the pain.   PAIN:  ***NPRS scale: 0/10 with walking when given something to decrease pressure, 10/10 at max   Pain location: R SI jt area Pain description: sharp and dull Aggravating factors: transfers, walking Relieving factors: rest  PRECAUTIONS: None  WEIGHT BEARING RESTRICTIONS: No  FALLS:  Has patient fallen in last 6 months? No  LIVING ENVIRONMENT: Lives  with: lives with their family Lives in: House/apartment Stairs: No Has following equipment at home: None  OCCUPATION: retired  PLOF: Independent  PATIENT GOALS: decrease pain and return to golf  Next MD Visit:    OBJECTIVE:   DIAGNOSTIC FINDINGS:  MRI pending  PATIENT SURVEYS:  Patient-Specific Activity Scoring Scheme  0 represents "unable to perform." 10 represents "able to perform at prior level. 0 1 2 3 4 5 6 7 8 9  10 (Date and Score)   Activity Eval  11/15/2023  12/13/2023  1. walking 6   6.5  2. bending to pick up objects  5 7   3. Getting OOB 5 7  4.    5.    Score 5.33 6.83   Total score = sum of the activity scores/number of activities Minimum detectable change (90%CI) for average score = 2 points Minimum detectable change (90%CI) for single activity score = 3 points  SCREENING FOR RED FLAGS: 11/15/2023 Bowel or bladder incontinence: No  COGNITION: 11/15/2023 Overall cognitive status: WFL normal      SENSATION: 11/15/2023 [facility]  MUSCLE LENGTH: 11/15/2023 Hamstrings: Right  WNL Left WNL  POSTURE:  11/15/2023 weight shift right and genu L ,lumbar lordosis dec, R PSIS post, R shoulder lower  PALPATION: 11/15/2023 1+ tenderness at R SI joint   AROM Eval 11/15/2023  Flexion 100%  Extension   Right lateral flexion   Left lateral flexion 75%  Right rotation   Left rotation 75%   (Blank rows = not tested)  LOWER EXTREMITY ROM:     Active  Right Eval 11/15/2023 Left Eval 11/15/2023  Hip flexion [facility]  Hip extension    Hip abduction    Hip adduction    Hip internal rotation    Hip external rotation    Knee flexion    Knee extension    Ankle dorsiflexion    Ankle plantarflexion    Ankle inversion    Ankle eversion     (Blank rows = not tested)  LOWER EXTREMITY MMT:  4+  MMT Right Eval 11/15/2023 Left Eval 11/15/2023  Hip flexion 4 4  Hip extension 4 4  Hip abduction 4 4  Hip adduction    Hip internal rotation    Hip  external rotation    Knee flexion    Knee extension    Ankle dorsiflexion    Ankle plantarflexion    Ankle inversion    Ankle eversion     (Blank rows = not tested)  LUMBAR SPECIAL TESTS:  11/15/2023 Straight leg raise test: Negative  FUNCTIONAL TESTS:  11/15/2023 none  GAIT: 11/15/2023  TODAY'S TREATMENT:                                                                                                          12/28/23***     12/20/23 TherEx: Recumbent bike level 5 for 9 minutes  Bilat leg press 3x15 with 112# Supine SLR with focus on TA and quad contraction, 2# 3x10 Seated rollouts with green theraball , fwd 2x30s, diagonal both direction 2x30s each  There Act RDLs with 12# KB,  3x10 Tap down squats to 22 table with 5# db 3x8;  Neuro Re ed for posture/balance T band Rows/Extension  blue 3x10 Supine hip abduction with resistance Tband G 3x15   12/13/2023 TherEx: Recumbent bike level 7 for 8 minutes  Bilat leg press 3x15 with 106# RDLs with 12# KB,  3x12 Supine SLR with focus on TA and quad contraction, 2x12 each LE  Seated rollouts with green theraball , fwd 2x30s, diagonal both direction 2x30s each     12/06/2023 TherEx: UBE with bilat UE and LE level 4 for 8 minutes  Bilat LE leg press 1x15 with 75#, 2x15 with 100# Standing RDLs with 10# KB, 3x10 with verbal cues required especially for slight knee flexion  Standing hamstring stretch between sets  Paloff press with blue TB 2x15 each side  Seated figure 4 stretch 2x30s each LE   11/23/2023 TherEx:  Nustep with bilat LE level 5 for 8 minutes  Supine SLR with quad contraction and TA activation 2x10 with 3s hold on each side  Multiple verbal and tactile cues required for redirection and appropriate form    TherAct: Attempted hip hikes with mirror for visual feedback, but unable to complete due to high level of difficulty  Tap down squats to 22 table with 5# db 3x8; seated rest breaks required secondary to onset of nerve-like pain in Rt LE   Self-Care: PT discussed xray results, interaction between spine, nerves, and musculature, how to make changes with traveling to assist with endurance and pain, next steps with POC   DATE: 11/17/23 Nustep level 6 6 min  Tband hip abd 3x10  Access Code: [email or web address] URL: [email or web address] Date: 11/17/2023 Prepared by: Burnard Meth  Exercises - Supine Bridge  - 2 x daily - 7 x weekly - 2 sets - 10 reps - Supine 90/90 Alternating Toe Touch  - 2 x daily - 7 x weekly - 2 sets - 10 reps - Side Plank on Knees  - 2 x daily - 7 x weekly - 2 sets - 10 reps - Prone Alternating Arm and Leg Lifts  - 2 x daily - 7 x weekly - 2 sets - 10 reps - Swiss Ball March  - 2 x daily - 7 x weekly - 2 sets - 10 reps - Diagonal Forward Reaches  - 2 x daily - 7 x weekly - 2 sets - 10 reps - Prone Bilateral Arm and Leg Lift  - 2 x daily - 7 x weekly - 2 sets - 10 reps - Hip Hiking on Step  -  2 x daily - 7 x weekly - 2 sets - 10 reps - Hooklying Clamshell with Resistance  - 2 x daily - 7 x weekly - 2 sets - 10 reps  DATE: 11/15/2023  Initial evaluation completed.  Pt to return this week for HEP due to time constraints.  PATIENT EDUCATION:  11/15/2023 Education details: HEP, POC Person educated: Patient Education method: Programmer, Multimedia, Demonstration, Verbal cues, and Handouts Education comprehension: verbalized understanding, returned demonstration, and verbal cues required  HOME EXERCISE PROGRAM: Pending  ASSESSMENT:  CLINICAL IMPRESSION: ***Patient needed cuing to slow down for improved posture.  Pain occasionally with exercise at R lumbar area with radiating pain to R LE.  Pain decreased with rest.   OBJECTIVE IMPAIRMENTS: Abnormal gait, decreased  balance, difficulty walking, decreased ROM, decreased strength, increased muscle spasms, improper body mechanics, and pain.   ACTIVITY LIMITATIONS: lifting, sitting, standing, squatting, and transfers  PARTICIPATION LIMITATIONS: yard work  PERSONAL FACTORS: 1 comorbidity: previous THA are also affecting patient's functional outcome.   REHAB POTENTIAL: Good  CLINICAL DECISION MAKING: Stable/uncomplicated  EVALUATION COMPLEXITY: Moderate   GOALS: Goals reviewed with patient? Yes  SHORT TERM GOALS: (target date for Short term goals are 3 weeks 12/06/2023)  1. Patient will demonstrate independent use of home exercise program to maintain progress from in clinic treatments.  Goal status: GOAL MET, 12/13/2023  LONG TERM GOALS: 01/10/24***     1. Patient will demonstrate/report pain at worst less than or equal to 2/10 to facilitate minimal limitation in daily activity secondary to pain symptoms.  Goal status: Ongoing 12/20/23   2. Patient will demonstrate independent use of home exercise program to facilitate ability to maintain/progress functional gains from skilled physical therapy services.  Goal status:Ongoing 12/20/23   3. Patient will demonstrate Patient specific functional scale by 2 points or more. indicate reduced disability due to condition.   Goal status: Ongoing 12/20/23   4. Patient will improve coordination of posture by 25 % through stabilization exercises.  Goal status: Ongoing 12/20/23   5.  Reduce pain with ambulation to 3/10.  Goal status: Ongoing 12/20/23   PLAN:  PT FREQUENCY: 1-2x/week  PT DURATION: 8 weeks  PLANNED INTERVENTIONS: Can include 02853- PT Re-evaluation, 97110-Therapeutic exercises, 97530- Therapeutic activity, 97112- Neuromuscular re-education, 97535- Self Care, 97140- Manual therapy, [phone]- Gait training, [phone]- Orthotic Fit/training, [phone]- Canalith repositioning, [id number]- Aquatic Therapy, [phone]- Electrical stimulation (unattended),  [id number] Physical performance testing, 97016- Vasopneumatic device, [id number]- Ultrasound, [id number]- Traction (mechanical), [id number]- Ionotophoresis 4mg /ml Dexamethasone ,  [id number] - Needle insertion w/o injection 1 or 2 muscles, 20561 - Needle insertion w/o injection 3 or more muscles.    Patient/Family education, Balance training, Stair training, Taping, Dry Needling, Joint mobilization, Joint manipulation, Spinal manipulation, Spinal mobilization, Scar mobilization, Vestibular training, Visual/preceptual remediation/compensation, DME instructions, Cryotherapy, and Moist heat.  All performed as medically necessary.  All included unless contraindicated  PLAN FOR NEXT SESSION:   ***Pt to make appt or call to see if they can try for  an MRI again.  Plan is to continue with PT while he waits for next consult/MRI.  Burnard Meth, PT 12/27/23  6:50 AM

## 2023-12-28 ENCOUNTER — Encounter

## 2023-12-28 NOTE — Progress Notes (Signed)
 Office Visit Note  Patient: Taylor Heath             Date of Birth: 05-11-1960           MRN: [id number]             PCP: Leonce Lucie PARAS, PA-C Referring: Bertell Satterfield, MD Visit Date: 01/11/2024   Subjective:  Medication Management (Patient has picture of Xrays . )  Discussed the use of AI scribe software for clinical note transcription with the patient, who gave verbal consent to proceed.  History of Present Illness   Taylor Heath is a 63 y.o. male here for follow up with seropositive rheumatoid arthritis on methotrexate  15 mg p.o. weekly folic acid  1 mg daily.    He experiences severe pain following hip replacement surgery, which significantly affects his ability to engage in activities such as golf. He finds relief by pressing on a specific area on his low back.  He reports that an MRI was performed after initial X-rays of his spine, and he was told he has scoliosis and foraminal stenosis. This condition causes symptoms such as tingling in his right leg and foot, especially at night. He completed six weeks of physical therapy prior to obtaining the MRI.  He has a history of shoulder pain, previously attributed to neck issues, which required an injection at the C7 level. He is currently taking methocarbamol  as a muscle relaxant, prescribed by Lucie Leonce, and finds it helpful in managing his pain. He is cautious with his medication use, saving doses for when the pain is most severe.  He reports a discrepancy in leg length following his first hip replacement at age 57, which was corrected during hip surgery. This correction has led to increased pain and difficulty with activities such as walking and playing golf while he works to correct a chronic imbalance/compensation.  He mentions a recent injury to his thumb, suspecting a sprain or tear of the ulnar collateral ligament, which he refers to as 'gamekeeper's thumb'. He has a history of similar injuries and  surgeries.  Previous HPI 10/11/2023 Taylor Heath is a 63 year old male with seropositive rheumatoid arthritis on methotrexate  15 mg p.o. weekly folic acid  1 mg daily.  He also complains with symptoms of sciatic nerve pain also with carpal tunnel syndrome pain and numbness affecting his right hand.   He experiences significant wrist pain, particularly in the right wrist, due to a combination of carpal tunnel syndrome and arthritis. The pain is characterized by numbness, tingling, and both sharp and dull sensations, exacerbated by activities such as using his phone and sleeping. He has a history of surgeries on his hands and wrists, with the right hand previously severely affected, leading to immobility of the fingers. Cortisone injections in the past have provided relief.  He last saw Dr. Gladis in July 16 with recommendation that he was having active inflammation with severe arthritis in the wrist but not interested in pursuing any surgical procedure at that time.   He reports a flare-up of sciatic nerve pain that began about six days ago after playing golf. The pain is severe, similar to a previous episode that required an emergency room visit four years ago. It has been persistent and difficult to manage over the past week.   He underwent hip surgery on August 15, 2023, to correct a leg length discrepancy. Post-surgery, he reports improved mobility and was initially able to play golf without pain.  He paused methotrexate  use around the time of his hip surgery and resumed it approximately two and a half weeks post-surgery.    Previous HPI 04/13/2023 Taylor Heath is a 63 y.o. male here for follow up for seropositive rheumatoid arthritis with complication of left wrist extensor tendon rupture. Now on methotrexate  15 mg p.o. weekly folic acid  1 mg daily but has held medication for weeks for surgery on 04/04/23.     He is experiencing postoperative swelling in his forearm following recent tendon  surgery. The swelling is not severe, as he can still wear his ring, and there is no redness or pain. He has been massaging his fingers to aid circulation and is cautious not to overwork the area. Certain movements cause pain, especially those that put tension on the tendon area where surgery was performed.   He has been off methotrexate  since before his surgery and is inquiring about when to resume it. He is currently completing a course of amoxicillin , which will finish on Sunday.He started this due to right sided jaw and facial pain he is not sure if this was from his tooth or possible sinus pain. No current pain in his tooth, which he initially thought was the source of his discomfort, and he can now chew without issue.    He recently returned from a trip to [place], where he played golf extensively, leading to significant aches and pains. He describes feeling lethargic and experiencing aches and pains, which he attributes to a possible mild flu. No congestion and states that his sinus symptoms have resolved.      11 /01/2023 DAMONTE Heath is a 63 y.o. male here for follow up for seropositive rheumatoid arthritis with complication of left wrist extensor tendon rupture. Now on methotrexate  15 mg p.o. weekly folic acid  1 mg daily.  He has also been taking diclofenac  75 mg half a tablet twice daily.  He has not experienced any major change in joint pain or mobility.  Left wrist is still limited in extension.  He has been getting increased swelling near the base of the thumb not associated with any pain redness or warmth in that area.  He followed up with Dr. Jerri did not do any new procedure did recommend considering hand surgical specialist consultation.  He is concerned about this affecting his left wrist and thumb movement worse which would limit golfing.   10/05/2022 Taylor Heath is a 63 y.o. male here for follow up for seropositive rheumatoid arthritis with complication of left wrist  extensor tendon rupture. Now on methotrexate  15 mg p.o. weekly folic acid  1 mg daily.  He has also been taking diclofenac  one half of a 75 mg tablet twice daily.  Feels arthritis symptoms are well-controlled not having significant morning joint pain or stiffness.  He has been staying physically active usually with golf 2 to 3 days/week feels his leg strength and mobility is a little bit improved.  Still has small amount of swelling around the left wrist and thumb but decreased compared to our last visit.   06/30/2022  KENTRELL HALLAHAN is a 63 y.o. male here for follow up for seropositive rheumatoid arthritis with complication of left wrist extensor tendon rupture.  Now on methotrexate  15 mg p.o. weekly folic acid  1 mg daily has not noticed any particular drug side effects.  He had some swelling in his right hand and wrist that improved without any additional intervention.  Still has some restriction in range of  motion at the left hand and wrist where he has surgical scars on the dorsal side of the hand.  Not causing him any particular functional limitation and not very painful.  He is asking about the folic acid  supplementation as he is also on a daily multivitamin that he thinks contains 600 mcg of folic acid .   04/13/22 VELDON WAGER is a 63 y.o. male here for follow up for rheumatoid arthritis with complication of left wrist extensor tendon rupture and prolonged recovery from surgical repair due to localized infection.  He finished his oral doxycycline  completely over the interval with no additional infectious disease follow-up scheduled.  He is working with occupational therapy on his extension he is almost able to achieve a full movement on the third and fourth finger.  No additional swelling and surgical scar is well-healed.  He has noticed some more pain in his right wrist that he attributes to relying more heavily on the 1 hand due to the prolonged immobilization and recovery on his left side.  He  also has carpal tunnel syndrome which may be contributing.  He is scheduled for umbilical hernia repair surgery on the 20th.   02/12/22 HRIDAY STAI is a 63 y.o. male here for seropositive RA. He previously saw Dr. Selma in 2016 without active disease noted at that time persistent positive CCP Abs.  He was never started on any long-term DMARD.  He has had some degree of chronic joint pain at the hands mostly related to first [facility] joint osteoarthritis.  Otherwise also with some chronic osteoarthritis symptoms affecting the knees and hips.  Recently with complications of tenosynovitis and hand deformity complicated by extensor tendon damage progressing towards possible rupture. Surgical repair with tenolysis was complicated recently with surgical site infection with staph aureus requiring debridement and subsequent prolonged antibiotic on linezolid  then doxycycline  planned for next month.  Currently symptoms are pretty much at baseline excepting the left wrist which has some stiffness and decreased range of motion with the ongoing healing dorsal surgical wound.  He has a family history his father had severe deforming rheumatoid arthritis.   Labs reviewed 10/2014 RF 106 CCP 34   Imaging reviewed MRI Left Wrist 11/26/21 IMPRESSION: 1. Moderate to high-grade fourth and fifth, moderate second and third, and mild to moderate 6th dorsal extensor compartment tenosynovitis. There is scattered debris and some thin internal septations within the tendon sheath fluid indicating mild complexity. 2. Multiple interstitial tears within the fourth and fifth extensor compartment tendons. Moderate extensor carpi ulnaris tendinosis with small interstitial tears. 3. Possible partial-thickness tear of the distal peripheral triangular fibrocartilage complex. 4. Possible partial-thickness tear of the proximal aspect of the lunotriquetral ligament. 5. Moderate to severe triscaphe, moderate thumb carpometacarpal, and moderate  lunotriquetral cartilage degenerative changes. Scattered carpal subchondral cysts.   Bilateral hand xrays 03/2020 multiple carpal cysts, mild to moderate 1st [facility] joint osteoarthritis   Review of Systems  Constitutional:  Negative for fatigue.  HENT:  Negative for mouth sores and mouth dryness.   Eyes:  Positive for dryness.  Respiratory:  Negative for shortness of breath.   Cardiovascular:  Negative for chest pain and palpitations.  Gastrointestinal:  Negative for blood in stool, constipation and diarrhea.  Endocrine: Negative for increased urination.  Genitourinary:  Negative for involuntary urination.  Musculoskeletal:  Positive for joint pain, joint pain, joint swelling, myalgias, muscle weakness, morning stiffness and myalgias. Negative for gait problem and muscle tenderness.  Skin:  Negative for color change, rash,  hair loss and sensitivity to sunlight.  Allergic/Immunologic: Negative for susceptible to infections.  Neurological:  Negative for dizziness and headaches.  Hematological:  Negative for swollen glands.  Psychiatric/Behavioral:  Positive for sleep disturbance. Negative for depressed mood. The patient is nervous/anxious.     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/15/2023   Umbilical hernia without obstruction and without gangrene 04/07/2022   Rectus diastasis 04/07/2022   Seropositive rheumatoid arthritis (HCC) 02/12/2022   High risk medication use 02/12/2022   Abscess of dorsum of left hand 01/26/2022   Extensor tenosynovitis of left wrist 12/01/2021   Primary osteoarthritis of right wrist 04/26/2017   Radiculopathy of cervical spine 04/26/2017   Total knee replacement status 01/27/2017   Primary osteoarthritis of right shoulder 12/30/2016   Primary osteoarthritis of right knee 12/30/2016    Insomnia 03/10/2012   BPH (benign prostatic hyperplasia) 12/09/2011   GERD (gastroesophageal reflux disease) 12/09/2011    Past Medical History:  Diagnosis Date   Anxiety    Arthritis    GERD (gastroesophageal reflux disease)    History of kidney stones    Pneumonia    Rheumatoid arthritis (HCC)    Severe scoliosis 10/2023    Family History  Problem Relation Age of Onset   Rheum arthritis Father    Past Surgical History:  Procedure Laterality Date   COLONOSCOPY  2015   FINGER SURGERY Left    index   HAND SURGERY Left 04/04/2023   HERNIA REPAIR  04/30/2022   I & D EXTREMITY Left 01/27/2022   Procedure: IRRIGATION AND DEBRIDEMENT LEFT HAND;  Surgeon: Jerri Kay HERO, MD;  Location: [facility] OR;  Service: Orthopedics;  Laterality: Left;   I & D EXTREMITY Left 01/29/2022   Procedure: REPEAT IRRIGATION AND DEBRIDEMENT OF HAND;  Surgeon: Jerri Kay HERO, MD;  Location: [facility] OR;  Service: Orthopedics;  Laterality: Left;   JOINT REPLACEMENT     left hip replaced   KNEE ARTHROSCOPY W/ ACL RECONSTRUCTION Right 2009   LEG SURGERY Left    left calf   MINOR APPLICATION OF WOUND VAC  01/29/2022   Procedure: APPLICATION OF WOUND VAC;  Surgeon: Jerri Kay HERO, MD;  Location: [facility] OR;  Service: Orthopedics;;   ORIF FINGER / THUMB FRACTURE     TOTAL HIP ARTHROPLASTY Right 08/15/2023   Procedure: ARTHROPLASTY, HIP, TOTAL, ANTERIOR APPROACH;  Surgeon: Jerri Kay HERO, MD;  Location: [facility] OR;  Service: Orthopedics;  Laterality: Right;  3-C   TOTAL KNEE ARTHROPLASTY Right 01/27/2017   Procedure: RIGHT TOTAL KNEE ARTHROPLASTY;  Surgeon: Jerri Kay HERO, MD;  Location: [facility] OR;  Service: Orthopedics;  Laterality: Right;   Social History   Social History Narrative   Not on file    There is no immunization history on file for this patient.   Objective: Vital Signs: BP 122/72   Pulse 80   Temp 98.5 F (36.9 C)   Resp 16   Ht 5' 9 (1.753 m)   Wt 164 lb 6.4 oz (74.6 kg)   BMI 24.28 kg/m    Physical  Exam Eyes:     Conjunctiva/sclera: Conjunctivae normal.  Cardiovascular:     Rate and Rhythm: Normal rate and regular rhythm.  Pulmonary:     Effort: Pulmonary effort is normal.     Breath sounds: Normal breath  sounds.  Skin:    General: Skin is warm and dry.  Neurological:     Mental Status: He is alert.  Psychiatric:        Mood and Affect: Mood normal.      Musculoskeletal Exam:  Shoulders full ROM no tenderness or swelling Elbows full ROM no tenderness or swelling Right wrist with palpable swelling on the flexor side more over ulnar than radius, slight restriction in flexion extension range of motion, mild tenderness to pressure and with percussion over carpal tunnel Fingers full ROM, left thumb tenderness with pulling or extension, weakness in opposition/pinch grip Knees full ROM no tenderness or swelling     Investigation: No additional findings.  Imaging: No results found.   Recent Labs: Lab Results  Component Value Date   WBC 6.7 01/11/2024   HGB 14.3 01/11/2024   PLT 281 01/11/2024   NA 142 01/11/2024   K 4.1 01/11/2024   CL 104 01/11/2024   CO2 27 01/11/2024   GLUCOSE 84 01/11/2024   BUN 17 01/11/2024   CREATININE 0.69 (L) 01/11/2024   BILITOT 0.4 01/11/2024   ALKPHOS 65 01/19/2017   AST 19 01/11/2024   ALT 21 01/11/2024   PROT 6.7 01/11/2024   ALBUMIN 4.5 01/19/2017   CALCIUM 9.5 01/11/2024   GFRAA >60 01/28/2017   QFTBGOLDPLUS NEGATIVE 02/12/2022    Speciality Comments: No specialty comments available.  Procedures:  No procedures performed Allergies: Tramadol  and Meperidine   Assessment / Plan:     Visit Diagnoses: Seropositive rheumatoid arthritis (HCC) - Plan: Sedimentation rate, methotrexate  (RHEUMATREX) 2.5 MG tablet, folic acid  (FOLVITE ) 1 MG tablet, diclofenac  (VOLTAREN ) 75 MG EC tablet Rheumatoid arthritis with bilateral wrist involvement and fingers, no active synovitis appreciable on physical exam today. The main complaints are  his back and hand issue smore chronic, structural related to OA and previous injury.  No other peripheral joint synovitis appreciated. - Rechecking sedimentation rate for disease activity monitoring - Continue methotrexate  15 mg p.o. weekly folic acid  1 mg daily - Continue diclofenac  75 mg BID PRN  Carpal tunnel syndrome, right upper limb - S/P Right hand capal tunnel 10/11/2023 Doing much better now, left hand is more problematic than right today  High risk medication use - methotrexate  15 mg p.o. weekly folic acid  1 mg daily. - Plan: CBC with Differential/Platelet, Comprehensive metabolic panel with GFR No serious interval infections. - Checking CBC and CMP for medication monitoring on methotrexate   Extensor tenosynovitis of left wrist Left thumb ulnar collateral ligament sprain (gamekeeper's thumb) Recent ulnar collateral ligament sprain with tenderness and pain, indicating a sprain rather than a complete tear.   Radiculopathy, lumbosacral region - Plan: methocarbamol  (ROBAXIN ) 750 MG tablet Chronic degenerative lumbar scoliosis with foraminal stenosis at L2-L3 causing lumbar radiculopathy. MRI confirms stenosis and disc bulging. Discussed nerve root injection for diagnosis and treatment, with radiofrequency ablation or surgical decompression as options. He is considering referral to a spine specialist. - Prescribed methocarbamol  90 tablets for muscle relaxation as needed or nightly. - Continue to follow up with spine specialist for evaluation and potential nerve root injection or surgical intervention.   Orders: Orders Placed This Encounter  Procedures   Sedimentation rate   CBC with Differential/Platelet   Comprehensive metabolic panel with GFR   Meds ordered this encounter  Medications   methotrexate  (RHEUMATREX) 2.5 MG tablet    Sig: Take 6 tablets (15 mg total) by mouth once a week. Caution:Chemotherapy. Protect from light.    Dispense:  72 tablet    Refill:  0   folic acid   (FOLVITE ) 1 MG tablet    Sig: Take 1 tablet (1 mg total) by mouth daily.    Dispense:  90 tablet    Refill:  3   diclofenac  (VOLTAREN ) 75 MG EC tablet    Sig: Take 1 tablet (75 mg total) by mouth 2 (two) times daily as needed.    Dispense:  30 tablet    Refill:  2   methocarbamol  (ROBAXIN ) 750 MG tablet    Sig: Take 1 tablet (750 mg total) by mouth every 8 (eight) hours as needed.    Dispense:  30 tablet    Refill:  1     Follow-Up Instructions: Return in about 3 months (around 04/12/2024) for RA on MTX f/u 3mos.   Lonni LELON Ester, MD  Note - This record has been created using [employer].  Chart creation errors have been sought, but may not always  have been located. Such creation errors do not reflect on  the standard of medical care.

## 2023-12-29 ENCOUNTER — Ambulatory Visit
Admission: RE | Admit: 2023-12-29 | Discharge: 2023-12-29 | Disposition: A | Discharge status: Home / Self Care | Source: Ambulatory Visit | Attending: Physical Medicine and Rehabilitation | Admitting: Physical Medicine and Rehabilitation

## 2023-12-29 DIAGNOSIS — M5416 Radiculopathy, lumbar region: Secondary | ICD-10-CM

## 2024-01-02 ENCOUNTER — Encounter: Payer: Self-pay | Admitting: Radiology

## 2024-01-09 ENCOUNTER — Ambulatory Visit: Admitting: Physical Medicine and Rehabilitation

## 2024-01-11 ENCOUNTER — Encounter: Payer: Self-pay | Admitting: Internal Medicine

## 2024-01-11 ENCOUNTER — Ambulatory Visit: Attending: Internal Medicine | Admitting: Internal Medicine

## 2024-01-11 VITALS — BP 122/72 | HR 80 | Temp 98.5°F | Resp 16 | Ht 69.0 in | Wt 164.4 lb

## 2024-01-11 DIAGNOSIS — M65932 Unspecified synovitis and tenosynovitis, left forearm: Secondary | ICD-10-CM | POA: Diagnosis not present

## 2024-01-11 DIAGNOSIS — Z79899 Other long term (current) drug therapy: Secondary | ICD-10-CM

## 2024-01-11 DIAGNOSIS — M5417 Radiculopathy, lumbosacral region: Secondary | ICD-10-CM

## 2024-01-11 DIAGNOSIS — G5601 Carpal tunnel syndrome, right upper limb: Secondary | ICD-10-CM | POA: Diagnosis not present

## 2024-01-11 DIAGNOSIS — M059 Rheumatoid arthritis with rheumatoid factor, unspecified: Secondary | ICD-10-CM

## 2024-01-11 LAB — COMPREHENSIVE METABOLIC PANEL WITH GFR
AG Ratio: 2.2 (calc) (ref 1.0–2.5)
ALT: 21 U/L (ref 9–46)
AST: 19 U/L (ref 10–35)
Albumin: 4.6 g/dL (ref 3.6–5.1)
Alkaline phosphatase (APISO): 55 U/L (ref 35–144)
BUN/Creatinine Ratio: 25 (calc) — ABNORMAL HIGH (ref 6–22)
BUN: 17 mg/dL (ref 7–25)
CO2: 27 mmol/L (ref 20–32)
Calcium: 9.5 mg/dL (ref 8.6–10.3)
Chloride: 104 mmol/L (ref 98–110)
Creat: 0.69 mg/dL — ABNORMAL LOW (ref 0.70–1.35)
Globulin: 2.1 g/dL (ref 1.9–3.7)
Glucose, Bld: 84 mg/dL (ref 65–99)
Potassium: 4.1 mmol/L (ref 3.5–5.3)
Sodium: 142 mmol/L (ref 135–146)
Total Bilirubin: 0.4 mg/dL (ref 0.2–1.2)
Total Protein: 6.7 g/dL (ref 6.1–8.1)
eGFR: 104 mL/min/1.73m2 (ref >=60)

## 2024-01-11 LAB — CBC WITH DIFFERENTIAL/PLATELET
Absolute Lymphocytes: 1434 {cells}/uL (ref 850–3900)
Absolute Monocytes: 509 {cells}/uL (ref 200–950)
Basophils Absolute: 7 {cells}/uL (ref 0–200)
Basophils Relative: 0.1 %
Eosinophils Absolute: 60 {cells}/uL (ref 15–500)
Eosinophils Relative: 0.9 %
HCT: 42.8 % (ref 38.5–50.0)
Hemoglobin: 14.3 g/dL (ref 13.2–17.1)
MCH: 31.8 pg (ref 27.0–33.0)
MCHC: 33.4 g/dL (ref 32.0–36.0)
MCV: 95.3 fL (ref 80.0–100.0)
MPV: 9.3 fL (ref 7.5–12.5)
Monocytes Relative: 7.6 %
Neutro Abs: 4690 {cells}/uL (ref 1500–7800)
Neutrophils Relative %: 70 %
Platelets: 281 Thousand/uL (ref 140–400)
RBC: 4.49 Million/uL (ref 4.20–5.80)
RDW: 13.6 % (ref 11.0–15.0)
Total Lymphocyte: 21.4 %
WBC: 6.7 Thousand/uL (ref 3.8–10.8)

## 2024-01-11 LAB — SEDIMENTATION RATE: Sed Rate: 2 mm/h (ref 0–20)

## 2024-01-11 MED ORDER — DICLOFENAC SODIUM 75 MG PO TBEC: 75.0000 mg | DELAYED_RELEASE_TABLET | Freq: Two times a day (BID) | ORAL | 2 refills | Status: DC | PRN

## 2024-01-11 MED ORDER — FOLIC ACID 1 MG PO TABS: 1.0000 mg | ORAL_TABLET | Freq: Every day | ORAL | 3 refills | Status: AC

## 2024-01-11 MED ORDER — METHOCARBAMOL 750 MG PO TABS: 750.0000 mg | ORAL_TABLET | Freq: Three times a day (TID) | ORAL | 1 refills | Status: AC | PRN

## 2024-01-11 MED ORDER — METHOTREXATE SODIUM 2.5 MG PO TABS: 15.0000 mg | ORAL_TABLET | ORAL | 0 refills | Status: DC

## 2024-01-12 ENCOUNTER — Ambulatory Visit: Admitting: Physical Medicine and Rehabilitation

## 2024-01-18 ENCOUNTER — Ambulatory Visit: Admitting: Physical Medicine and Rehabilitation

## 2024-02-06 ENCOUNTER — Ambulatory Visit: Payer: Self-pay | Admitting: Internal Medicine

## 2024-02-06 ENCOUNTER — Other Ambulatory Visit: Payer: Self-pay | Admitting: Internal Medicine

## 2024-02-06 DIAGNOSIS — M059 Rheumatoid arthritis with rheumatoid factor, unspecified: Secondary | ICD-10-CM

## 2024-02-06 NOTE — Telephone Encounter (Signed)
 Last Fill: 01/11/2024  Labs: 01/11/2024 Creat. 0.69, BUN/Creat. Ratio 25  Next Visit: 04/16/2024  Last Visit: 01/11/2024  DX: Seropositive rheumatoid arthritis   Current Dose per office note 01/11/2024: diclofenac  75 mg BID PRN   Okay to refill Diclofenac ?

## 2024-02-15 ENCOUNTER — Ambulatory Visit: Admitting: Physician Assistant

## 2024-02-21 ENCOUNTER — Ambulatory Visit: Admitting: Physician Assistant

## 2024-02-21 ENCOUNTER — Other Ambulatory Visit: Payer: Self-pay

## 2024-02-21 DIAGNOSIS — Z96641 Presence of right artificial hip joint: Secondary | ICD-10-CM

## 2024-02-21 NOTE — Progress Notes (Signed)
 "  Post-Op Visit Note   Patient: Taylor Heath           Date of Birth: Jul 02, 1960           MRN: [id number] Visit Date: 02/21/2024 PCP: Leonce Lucie PARAS, PA-C   Assessment & Plan:  Chief Complaint:  Chief Complaint  Patient presents with   Right Hip - Follow-up    Right total hip arthroplasty 08/15/2023   Visit Diagnoses:  1. History of total hip replacement, right     Plan: Patient is a pleasant 63 year old gentleman who comes in today 6 months status post right total hip replacement.  He has been doing fairly well.  He notes occasional twinges of pain to the groin which is relieved with stretching.  He has also been dealing with his low back and has recently had an epidural steroid injection which has helped some of the pain to his right lower extremity.  Examination of his right hip reveals painless hip flexion logroll.  He is neurovascularly intact distally.  At this point, he will continue to increase activity as tolerated.  Follow-up in 6 months for repeat evaluation and AP pelvis x-rays.  Call with concerns or questions.  Follow-Up Instructions: Return in about 6 months (around 08/21/2024).   Orders:  Orders Placed This Encounter  Procedures   XR Pelvis 1-2 Views   No orders of the defined types were placed in this encounter.   Imaging: XR Pelvis 1-2 Views Result Date: 02/21/2024 Well-seated prosthesis without complication   PMFS History: Patient Active Problem List   Diagnosis Date Noted   Carpal tunnel syndrome, right upper limb 10/11/2023   Primary osteoarthritis of right hip 08/15/2023   Status post total replacement of right hip 08/15/2023   Umbilical hernia without obstruction and without gangrene 04/07/2022   Rectus diastasis 04/07/2022   Seropositive rheumatoid arthritis (HCC) 02/12/2022   High risk medication use 02/12/2022   Abscess of dorsum of left hand 01/26/2022   Extensor tenosynovitis of left wrist 12/01/2021   Primary osteoarthritis of  right wrist 04/26/2017   Radiculopathy of cervical spine 04/26/2017   Total knee replacement status 01/27/2017   Primary osteoarthritis of right shoulder 12/30/2016   Primary osteoarthritis of right knee 12/30/2016   Insomnia 03/10/2012   BPH (benign prostatic hyperplasia) 12/09/2011   GERD (gastroesophageal reflux disease) 12/09/2011   Past Medical History:  Diagnosis Date   Anxiety    Arthritis    GERD (gastroesophageal reflux disease)    History of kidney stones    Pneumonia    Rheumatoid arthritis (HCC)    Severe scoliosis 10/2023    Family History  Problem Relation Age of Onset   Rheum arthritis Father     Past Surgical History:  Procedure Laterality Date   COLONOSCOPY  2015   FINGER SURGERY Left    index   HAND SURGERY Left 04/04/2023   HERNIA REPAIR  04/30/2022   I & D EXTREMITY Left 01/27/2022   Procedure: IRRIGATION AND DEBRIDEMENT LEFT HAND;  Surgeon: Jerri Kay HERO, MD;  Location: [facility] OR;  Service: Orthopedics;  Laterality: Left;   I & D EXTREMITY Left 01/29/2022   Procedure: REPEAT IRRIGATION AND DEBRIDEMENT OF HAND;  Surgeon: Jerri Kay HERO, MD;  Location: [facility];  Service: Orthopedics;  Laterality: Left;   JOINT REPLACEMENT     left hip replaced   KNEE ARTHROSCOPY W/ ACL RECONSTRUCTION Right 2009   LEG SURGERY Left    left calf   MINOR  APPLICATION OF WOUND VAC  01/29/2022   Procedure: APPLICATION OF WOUND VAC;  Surgeon: Jerri Kay HERO, MD;  Location: [facility];  Service: Orthopedics;;   ORIF FINGER / THUMB FRACTURE     TOTAL HIP ARTHROPLASTY Right 08/15/2023   Procedure: ARTHROPLASTY, HIP, TOTAL, ANTERIOR APPROACH;  Surgeon: Jerri Kay HERO, MD;  Location: [facility];  Service: Orthopedics;  Laterality: Right;  3-C   TOTAL KNEE ARTHROPLASTY Right 01/27/2017   Procedure: RIGHT TOTAL KNEE ARTHROPLASTY;  Surgeon: Jerri Kay HERO, MD;  Location: [facility];  Service: Orthopedics;  Laterality: Right;   Social History   Occupational History   Not on file  Tobacco Use   Smoking  status: Former    Current packs/day: 0.00    Types: Cigarettes    Quit date: 04/21/2010    Years since quitting: 13.8    Passive exposure: Past   Smokeless tobacco: Former    Types: Engineer, Drilling   Vaping status: Never Used  Substance and Sexual Activity   Alcohol use: No   Drug use: No   Sexual activity: Not on file     "

## 2024-04-01 ENCOUNTER — Other Ambulatory Visit: Payer: Self-pay | Admitting: Internal Medicine

## 2024-04-01 DIAGNOSIS — M059 Rheumatoid arthritis with rheumatoid factor, unspecified: Secondary | ICD-10-CM

## 2024-04-02 NOTE — Telephone Encounter (Signed)
 Last Fill: 01/11/2024  Labs: 01/10/2025  There were no significant abnormal results and no new medication changes needed.   Next Visit: 04/16/2024  Last Visit: 01/11/2024  DX: Seropositive rheumatoid arthritis   Current Dose per office note 01/11/2024: methotrexate  15 mg p.o. weekly   Okay to refill Methotrexate ?

## 2024-04-04 NOTE — Assessment & Plan Note (Signed)
 SABRA

## 2024-04-04 NOTE — Assessment & Plan Note (Signed)
 Taylor Heath

## 2024-04-04 NOTE — Progress Notes (Unsigned)
 "  Office Visit Note  Patient: Taylor Heath             Date of Birth: 1961/01/08           MRN: [id number]             PCP: Leonce Lucie PARAS, PA-C Referring: Leonce Lucie PARAS, PA-C Visit Date: 04/16/2024   Subjective:  No chief complaint on file.   History of Present Illness: Taylor Heath is a 64 y.o. male here for follow up with seropositive rheumatoid arthritis on methotrexate  15 mg p.o. weekly folic acid  1 mg daily.   Previous HPI 01/11/2024 Taylor Heath is a 64 y.o. male here for follow up with seropositive rheumatoid arthritis on methotrexate  15 mg p.o. weekly folic acid  1 mg daily.     He experiences severe pain following hip replacement surgery, which significantly affects his ability to engage in activities such as golf. He finds relief by pressing on a specific area on his low back.   He reports that an MRI was performed after initial X-rays of his spine, and he was told he has scoliosis and foraminal stenosis. This condition causes symptoms such as tingling in his right leg and foot, especially at night. He completed six weeks of physical therapy prior to obtaining the MRI.   He has a history of shoulder pain, previously attributed to neck issues, which required an injection at the C7 level. He is currently taking methocarbamol  as a muscle relaxant, prescribed by Lucie Leonce, and finds it helpful in managing his pain. He is cautious with his medication use, saving doses for when the pain is most severe.   He reports a discrepancy in leg length following his first hip replacement at age 38, which was corrected during hip surgery. This correction has led to increased pain and difficulty with activities such as walking and playing golf while he works to correct a chronic imbalance/compensation.   He mentions a recent injury to his thumb, suspecting a sprain or tear of the ulnar collateral ligament, which he refers to as 'gamekeeper's thumb'. He has a history  of similar injuries and surgeries.   Previous HPI 10/11/2023 Taylor Heath is a 64 year old male with seropositive rheumatoid arthritis on methotrexate  15 mg p.o. weekly folic acid  1 mg daily.  He also complains with symptoms of sciatic nerve pain also with carpal tunnel syndrome pain and numbness affecting his right hand.   He experiences significant wrist pain, particularly in the right wrist, due to a combination of carpal tunnel syndrome and arthritis. The pain is characterized by numbness, tingling, and both sharp and dull sensations, exacerbated by activities such as using his phone and sleeping. He has a history of surgeries on his hands and wrists, with the right hand previously severely affected, leading to immobility of the fingers. Cortisone injections in the past have provided relief.  He last saw Dr. Gladis in July 16 with recommendation that he was having active inflammation with severe arthritis in the wrist but not interested in pursuing any surgical procedure at that time.   He reports a flare-up of sciatic nerve pain that began about six days ago after playing golf. The pain is severe, similar to a previous episode that required an emergency room visit four years ago. It has been persistent and difficult to manage over the past week.   He underwent hip surgery on August 15, 2023, to correct a leg length discrepancy. Post-surgery, he  reports improved mobility and was initially able to play golf without pain.   He paused methotrexate  use around the time of his hip surgery and resumed it approximately two and a half weeks post-surgery.    Previous HPI 04/13/2023 Taylor Heath is a 64 y.o. male here for follow up for seropositive rheumatoid arthritis with complication of left wrist extensor tendon rupture. Now on methotrexate  15 mg p.o. weekly folic acid  1 mg daily but has held medication for weeks for surgery on 04/04/23.     He is experiencing postoperative swelling in his  forearm following recent tendon surgery. The swelling is not severe, as he can still wear his ring, and there is no redness or pain. He has been massaging his fingers to aid circulation and is cautious not to overwork the area. Certain movements cause pain, especially those that put tension on the tendon area where surgery was performed.   He has been off methotrexate  since before his surgery and is inquiring about when to resume it. He is currently completing a course of amoxicillin , which will finish on Sunday.He started this due to right sided jaw and facial pain he is not sure if this was from his tooth or possible sinus pain. No current pain in his tooth, which he initially thought was the source of his discomfort, and he can now chew without issue.    He recently returned from a trip to [place], where he played golf extensively, leading to significant aches and pains. He describes feeling lethargic and experiencing aches and pains, which he attributes to a possible mild flu. No congestion and states that his sinus symptoms have resolved.      11 /01/2023 Taylor Heath is a 64 y.o. male here for follow up for seropositive rheumatoid arthritis with complication of left wrist extensor tendon rupture. Now on methotrexate  15 mg p.o. weekly folic acid  1 mg daily.  He has also been taking diclofenac  75 mg half a tablet twice daily.  He has not experienced any major change in joint pain or mobility.  Left wrist is still limited in extension.  He has been getting increased swelling near the base of the thumb not associated with any pain redness or warmth in that area.  He followed up with Dr. Jerri did not do any new procedure did recommend considering hand surgical specialist consultation.  He is concerned about this affecting his left wrist and thumb movement worse which would limit golfing.   10/05/2022 Taylor Heath is a 64 y.o. male here for follow up for seropositive rheumatoid arthritis with  complication of left wrist extensor tendon rupture. Now on methotrexate  15 mg p.o. weekly folic acid  1 mg daily.  He has also been taking diclofenac  one half of a 75 mg tablet twice daily.  Feels arthritis symptoms are well-controlled not having significant morning joint pain or stiffness.  He has been staying physically active usually with golf 2 to 3 days/week feels his leg strength and mobility is a little bit improved.  Still has small amount of swelling around the left wrist and thumb but decreased compared to our last visit.   06/30/2022  Taylor Heath is a 64 y.o. male here for follow up for seropositive rheumatoid arthritis with complication of left wrist extensor tendon rupture.  Now on methotrexate  15 mg p.o. weekly folic acid  1 mg daily has not noticed any particular drug side effects.  He had some swelling in his right hand and wrist  that improved without any additional intervention.  Still has some restriction in range of motion at the left hand and wrist where he has surgical scars on the dorsal side of the hand.  Not causing him any particular functional limitation and not very painful.  He is asking about the folic acid  supplementation as he is also on a daily multivitamin that he thinks contains 600 mcg of folic acid .   04/13/22 Taylor Heath is a 64 y.o. male here for follow up for rheumatoid arthritis with complication of left wrist extensor tendon rupture and prolonged recovery from surgical repair due to localized infection.  He finished his oral doxycycline  completely over the interval with no additional infectious disease follow-up scheduled.  He is working with occupational therapy on his extension he is almost able to achieve a full movement on the third and fourth finger.  No additional swelling and surgical scar is well-healed.  He has noticed some more pain in his right wrist that he attributes to relying more heavily on the 1 hand due to the prolonged immobilization and  recovery on his left side.  He also has carpal tunnel syndrome which may be contributing.  He is scheduled for umbilical hernia repair surgery on the 20th.   02/12/22 Taylor Heath is a 64 y.o. male here for seropositive RA. He previously saw Dr. Selma in 2016 without active disease noted at that time persistent positive CCP Abs.  He was never started on any long-term DMARD.  He has had some degree of chronic joint pain at the hands mostly related to first [facility] joint osteoarthritis.  Otherwise also with some chronic osteoarthritis symptoms affecting the knees and hips.  Recently with complications of tenosynovitis and hand deformity complicated by extensor tendon damage progressing towards possible rupture. Surgical repair with tenolysis was complicated recently with surgical site infection with staph aureus requiring debridement and subsequent prolonged antibiotic on linezolid  then doxycycline  planned for next month.  Currently symptoms are pretty much at baseline excepting the left wrist which has some stiffness and decreased range of motion with the ongoing healing dorsal surgical wound.  He has a family history his father had severe deforming rheumatoid arthritis.   Labs reviewed 10/2014 RF 106 CCP 34   Imaging reviewed MRI Left Wrist 11/26/21 IMPRESSION: 1. Moderate to high-grade fourth and fifth, moderate second and third, and mild to moderate 6th dorsal extensor compartment tenosynovitis. There is scattered debris and some thin internal septations within the tendon sheath fluid indicating mild complexity. 2. Multiple interstitial tears within the fourth and fifth extensor compartment tendons. Moderate extensor carpi ulnaris tendinosis with small interstitial tears. 3. Possible partial-thickness tear of the distal peripheral triangular fibrocartilage complex. 4. Possible partial-thickness tear of the proximal aspect of the lunotriquetral ligament. 5. Moderate to severe triscaphe, moderate thumb  carpometacarpal, and moderate lunotriquetral cartilage degenerative changes. Scattered carpal subchondral cysts.   Bilateral hand xrays 03/2020 multiple carpal cysts, mild to moderate 1st [facility] joint osteoarthritis   No Rheumatology ROS completed.   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   Radiculopathy of cervical spine 04/26/2017   Total knee replacement status 01/27/2017   Primary osteoarthritis of right shoulder 12/30/2016   Primary osteoarthritis of right knee 12/30/2016   Insomnia 03/10/2012   BPH (benign prostatic hyperplasia) 12/09/2011   GERD (gastroesophageal reflux disease) 12/09/2011    Past Medical History:  Diagnosis Date   Anxiety    Arthritis    GERD (gastroesophageal reflux disease)    History of kidney stones    Pneumonia    Rheumatoid arthritis (HCC)    Severe scoliosis 10/2023    Family History  Problem Relation Age of Onset   Rheum arthritis Father    Past Surgical History:  Procedure Laterality Date   COLONOSCOPY  2015   FINGER SURGERY Left    index   HAND SURGERY Left 04/04/2023   HERNIA REPAIR  04/30/2022   I & D EXTREMITY Left 01/27/2022   Procedure: IRRIGATION AND DEBRIDEMENT LEFT HAND;  Surgeon: Jerri Kay HERO, MD;  Location: [facility] OR;  Service: Orthopedics;  Laterality: Left;   I & D EXTREMITY Left 01/29/2022   Procedure: REPEAT IRRIGATION AND DEBRIDEMENT OF HAND;  Surgeon: Jerri Kay HERO, MD;  Location: [facility] OR;  Service:  Orthopedics;  Laterality: Left;   JOINT REPLACEMENT     left hip replaced   KNEE ARTHROSCOPY W/ ACL RECONSTRUCTION Right 2009   LEG SURGERY Left    left calf   MINOR APPLICATION OF WOUND VAC  01/29/2022   Procedure: APPLICATION OF WOUND VAC;  Surgeon: Jerri Kay HERO, MD;  Location: [facility] OR;  Service: Orthopedics;;   ORIF FINGER / THUMB FRACTURE     TOTAL HIP ARTHROPLASTY Right 08/15/2023   Procedure: ARTHROPLASTY, HIP, TOTAL, ANTERIOR APPROACH;  Surgeon: Jerri Kay HERO, MD;  Location: [facility] OR;  Service: Orthopedics;  Laterality: Right;  3-C   TOTAL KNEE ARTHROPLASTY Right 01/27/2017   Procedure: RIGHT TOTAL KNEE ARTHROPLASTY;  Surgeon: Jerri Kay HERO, MD;  Location: [facility] OR;  Service: Orthopedics;  Laterality: Right;   Social History   Social History Narrative   Not on file    There is no immunization history on file for this patient.   Objective: Vital Signs: There were no vitals taken for this visit.   Physical Exam   Musculoskeletal Exam: ***   Investigation: No additional findings.  Imaging: No results found.  Recent Labs: Lab Results  Component Value Date   WBC 6.7 01/11/2024   HGB 14.3 01/11/2024   PLT 281 01/11/2024   NA 142 01/11/2024   K 4.1 01/11/2024   CL 104 01/11/2024   CO2 27 01/11/2024   GLUCOSE 84 01/11/2024   BUN 17 01/11/2024   CREATININE 0.69 (L) 01/11/2024   BILITOT 0.4 01/11/2024   ALKPHOS 65 01/19/2017   AST 19 01/11/2024   ALT 21 01/11/2024   PROT 6.7 01/11/2024   ALBUMIN 4.5 01/19/2017   CALCIUM 9.5 01/11/2024   GFRAA >60 01/28/2017   QFTBGOLDPLUS NEGATIVE 02/12/2022    Speciality Comments: No specialty comments available.  Procedures:  No procedures performed Allergies: Tramadol  and Meperidine   Assessment / Plan:     Visit Diagnoses:  Assessment & Plan Seropositive rheumatoid arthritis (HCC)     Carpal tunnel syndrome, right upper limb     High risk medication use     Extensor tenosynovitis of left wrist      Radiculopathy, lumbosacral region      ***  Follow-Up Instructions: No follow-ups on file.   Sulema Braid M Tocarra Gassen, CMA  Note - This record has been created using Animal nutritionist.  Chart creation errors have been sought, but may not always  have been located. Such creation errors do not reflect on  the standard of medical care. "

## 2024-04-16 ENCOUNTER — Ambulatory Visit: Admitting: Internal Medicine

## 2024-04-16 DIAGNOSIS — M059 Rheumatoid arthritis with rheumatoid factor, unspecified: Secondary | ICD-10-CM

## 2024-04-16 DIAGNOSIS — G5601 Carpal tunnel syndrome, right upper limb: Secondary | ICD-10-CM

## 2024-04-16 DIAGNOSIS — M5417 Radiculopathy, lumbosacral region: Secondary | ICD-10-CM

## 2024-04-16 DIAGNOSIS — Z79899 Other long term (current) drug therapy: Secondary | ICD-10-CM

## 2024-04-16 DIAGNOSIS — M65932 Unspecified synovitis and tenosynovitis, left forearm: Secondary | ICD-10-CM

## 2024-11-07 ENCOUNTER — Other Ambulatory Visit

## 2024-11-14 ENCOUNTER — Ambulatory Visit: Admitting: Urology
